# Patient Record
Sex: Female | Born: 1935 | Race: White | Hispanic: No | Smoking: Former smoker
Health system: Southern US, Community
[De-identification: ages and names within clinical notes are randomized; demographics above are authoritative.]

## PROBLEM LIST (undated history)

## (undated) DIAGNOSIS — G20 Parkinson's disease: Secondary | ICD-10-CM

## (undated) DIAGNOSIS — J189 Pneumonia, unspecified organism: Secondary | ICD-10-CM

## (undated) DIAGNOSIS — G20A1 Parkinson's disease without dyskinesia, without mention of fluctuations: Secondary | ICD-10-CM

## (undated) DIAGNOSIS — J9620 Acute and chronic respiratory failure, unspecified whether with hypoxia or hypercapnia: Secondary | ICD-10-CM

## (undated) DIAGNOSIS — M47819 Spondylosis without myelopathy or radiculopathy, site unspecified: Secondary | ICD-10-CM

## (undated) DIAGNOSIS — Z923 Personal history of irradiation: Secondary | ICD-10-CM

## (undated) DIAGNOSIS — I10 Essential (primary) hypertension: Secondary | ICD-10-CM

## (undated) DIAGNOSIS — R296 Repeated falls: Secondary | ICD-10-CM

## (undated) DIAGNOSIS — I639 Cerebral infarction, unspecified: Secondary | ICD-10-CM

## (undated) DIAGNOSIS — R1310 Dysphagia, unspecified: Secondary | ICD-10-CM

## (undated) DIAGNOSIS — I69354 Hemiplegia and hemiparesis following cerebral infarction affecting left non-dominant side: Secondary | ICD-10-CM

## (undated) DIAGNOSIS — C801 Malignant (primary) neoplasm, unspecified: Secondary | ICD-10-CM

## (undated) DIAGNOSIS — N289 Disorder of kidney and ureter, unspecified: Secondary | ICD-10-CM

## (undated) DIAGNOSIS — I4510 Unspecified right bundle-branch block: Secondary | ICD-10-CM

## (undated) DIAGNOSIS — J449 Chronic obstructive pulmonary disease, unspecified: Secondary | ICD-10-CM

## (undated) DIAGNOSIS — D649 Anemia, unspecified: Secondary | ICD-10-CM

## (undated) DIAGNOSIS — G8929 Other chronic pain: Secondary | ICD-10-CM

## (undated) DIAGNOSIS — K219 Gastro-esophageal reflux disease without esophagitis: Secondary | ICD-10-CM

## (undated) HISTORY — DX: Personal history of irradiation: Z92.3

## (undated) HISTORY — PX: ABDOMINAL HYSTERECTOMY: SHX81

---

## 1997-10-19 ENCOUNTER — Other Ambulatory Visit: Admission: RE | Admit: 1997-10-19 | Discharge: 1997-10-19 | Payer: Self-pay | Admitting: Family Medicine

## 1997-10-29 ENCOUNTER — Ambulatory Visit (HOSPITAL_COMMUNITY): Admission: RE | Admit: 1997-10-29 | Discharge: 1997-10-29 | Payer: Self-pay | Admitting: Family Medicine

## 1998-02-15 ENCOUNTER — Emergency Department (HOSPITAL_COMMUNITY): Admission: EM | Admit: 1998-02-15 | Discharge: 1998-02-15 | Payer: Self-pay | Admitting: Emergency Medicine

## 1998-05-18 ENCOUNTER — Other Ambulatory Visit: Admission: RE | Admit: 1998-05-18 | Discharge: 1998-05-18 | Payer: Self-pay | Admitting: *Deleted

## 1999-03-08 ENCOUNTER — Encounter (INDEPENDENT_AMBULATORY_CARE_PROVIDER_SITE_OTHER): Payer: Self-pay

## 1999-03-08 ENCOUNTER — Ambulatory Visit (HOSPITAL_COMMUNITY): Admission: RE | Admit: 1999-03-08 | Discharge: 1999-03-08 | Payer: Self-pay | Admitting: Gastroenterology

## 1999-07-20 ENCOUNTER — Other Ambulatory Visit: Admission: RE | Admit: 1999-07-20 | Discharge: 1999-07-20 | Payer: Self-pay | Admitting: Family Medicine

## 1999-10-05 ENCOUNTER — Encounter: Admission: RE | Admit: 1999-10-05 | Discharge: 1999-10-05 | Payer: Self-pay | Admitting: Gastroenterology

## 1999-10-05 ENCOUNTER — Encounter: Payer: Self-pay | Admitting: Gastroenterology

## 2000-07-01 ENCOUNTER — Other Ambulatory Visit: Admission: RE | Admit: 2000-07-01 | Discharge: 2000-07-01 | Payer: Self-pay | Admitting: Family Medicine

## 2000-12-25 ENCOUNTER — Encounter: Payer: Self-pay | Admitting: Emergency Medicine

## 2000-12-25 ENCOUNTER — Emergency Department (HOSPITAL_COMMUNITY): Admission: EM | Admit: 2000-12-25 | Discharge: 2000-12-25 | Payer: Self-pay

## 2001-04-04 ENCOUNTER — Inpatient Hospital Stay (HOSPITAL_COMMUNITY): Admission: EM | Admit: 2001-04-04 | Discharge: 2001-04-05 | Payer: Self-pay | Admitting: Emergency Medicine

## 2001-04-04 ENCOUNTER — Encounter: Payer: Self-pay | Admitting: Emergency Medicine

## 2005-07-18 ENCOUNTER — Encounter: Admission: RE | Admit: 2005-07-18 | Discharge: 2005-10-16 | Payer: Self-pay | Admitting: Family Medicine

## 2006-07-13 ENCOUNTER — Inpatient Hospital Stay (HOSPITAL_COMMUNITY): Admission: EM | Admit: 2006-07-13 | Discharge: 2006-07-17 | Payer: Self-pay | Admitting: Emergency Medicine

## 2006-07-15 ENCOUNTER — Encounter (INDEPENDENT_AMBULATORY_CARE_PROVIDER_SITE_OTHER): Payer: Self-pay | Admitting: Neurology

## 2006-07-15 ENCOUNTER — Encounter (INDEPENDENT_AMBULATORY_CARE_PROVIDER_SITE_OTHER): Payer: Self-pay | Admitting: Cardiology

## 2006-07-17 ENCOUNTER — Inpatient Hospital Stay (HOSPITAL_COMMUNITY)
Admission: RE | Admit: 2006-07-17 | Discharge: 2006-08-01 | Payer: Self-pay | Admitting: Physical Medicine & Rehabilitation

## 2006-07-18 ENCOUNTER — Ambulatory Visit: Payer: Self-pay | Admitting: Physical Medicine & Rehabilitation

## 2006-09-13 ENCOUNTER — Ambulatory Visit: Payer: Self-pay | Admitting: Physical Medicine & Rehabilitation

## 2006-09-13 ENCOUNTER — Encounter
Admission: RE | Admit: 2006-09-13 | Discharge: 2006-12-12 | Payer: Self-pay | Admitting: Physical Medicine & Rehabilitation

## 2006-09-18 ENCOUNTER — Encounter
Admission: RE | Admit: 2006-09-18 | Discharge: 2006-12-17 | Payer: Self-pay | Admitting: Physical Medicine & Rehabilitation

## 2006-11-19 ENCOUNTER — Ambulatory Visit: Payer: Self-pay | Admitting: Physical Medicine & Rehabilitation

## 2006-12-18 ENCOUNTER — Encounter
Admission: RE | Admit: 2006-12-18 | Discharge: 2007-03-18 | Payer: Self-pay | Admitting: Physical Medicine & Rehabilitation

## 2007-01-17 ENCOUNTER — Ambulatory Visit: Payer: Self-pay | Admitting: Physical Medicine & Rehabilitation

## 2007-01-17 ENCOUNTER — Encounter
Admission: RE | Admit: 2007-01-17 | Discharge: 2007-01-20 | Payer: Self-pay | Admitting: Physical Medicine & Rehabilitation

## 2008-08-10 ENCOUNTER — Encounter: Admission: RE | Admit: 2008-08-10 | Discharge: 2008-08-10 | Payer: Self-pay | Admitting: Family Medicine

## 2009-06-09 ENCOUNTER — Inpatient Hospital Stay (HOSPITAL_COMMUNITY): Admission: EM | Admit: 2009-06-09 | Discharge: 2009-06-16 | Payer: Self-pay | Admitting: Emergency Medicine

## 2009-06-10 ENCOUNTER — Ambulatory Visit: Payer: Self-pay | Admitting: Vascular Surgery

## 2009-06-10 ENCOUNTER — Encounter (INDEPENDENT_AMBULATORY_CARE_PROVIDER_SITE_OTHER): Payer: Self-pay | Admitting: Internal Medicine

## 2010-06-16 ENCOUNTER — Other Ambulatory Visit: Payer: Self-pay | Admitting: Gynecology

## 2010-06-16 DIAGNOSIS — N950 Postmenopausal bleeding: Secondary | ICD-10-CM

## 2010-06-19 ENCOUNTER — Ambulatory Visit
Admission: RE | Admit: 2010-06-19 | Discharge: 2010-06-19 | Disposition: A | Discharge status: Home / Self Care | Payer: Self-pay | Source: Ambulatory Visit | Attending: Gynecology | Admitting: Gynecology

## 2010-06-19 DIAGNOSIS — N950 Postmenopausal bleeding: Secondary | ICD-10-CM

## 2010-06-19 MED ORDER — IOHEXOL 300 MG/ML  SOLN
100.0000 mL | Freq: Once | INTRAMUSCULAR | Status: AC | PRN
  Administered 2010-06-19: 100 mL via INTRAVENOUS

## 2010-06-21 ENCOUNTER — Ambulatory Visit: Payer: Medicare Other | Attending: Gynecologic Oncology | Admitting: Gynecologic Oncology

## 2010-06-21 DIAGNOSIS — C549 Malignant neoplasm of corpus uteri, unspecified: Secondary | ICD-10-CM | POA: Insufficient documentation

## 2010-06-21 DIAGNOSIS — E119 Type 2 diabetes mellitus without complications: Secondary | ICD-10-CM | POA: Insufficient documentation

## 2010-06-21 DIAGNOSIS — I10 Essential (primary) hypertension: Secondary | ICD-10-CM | POA: Insufficient documentation

## 2010-06-21 DIAGNOSIS — Z794 Long term (current) use of insulin: Secondary | ICD-10-CM | POA: Insufficient documentation

## 2010-06-21 DIAGNOSIS — R351 Nocturia: Secondary | ICD-10-CM | POA: Insufficient documentation

## 2010-06-21 DIAGNOSIS — R350 Frequency of micturition: Secondary | ICD-10-CM | POA: Insufficient documentation

## 2010-06-21 DIAGNOSIS — Z79899 Other long term (current) drug therapy: Secondary | ICD-10-CM | POA: Insufficient documentation

## 2010-06-21 DIAGNOSIS — Z8673 Personal history of transient ischemic attack (TIA), and cerebral infarction without residual deficits: Secondary | ICD-10-CM | POA: Insufficient documentation

## 2010-06-21 DIAGNOSIS — Z86718 Personal history of other venous thrombosis and embolism: Secondary | ICD-10-CM | POA: Insufficient documentation

## 2010-06-21 DIAGNOSIS — K5900 Constipation, unspecified: Secondary | ICD-10-CM | POA: Insufficient documentation

## 2010-06-30 ENCOUNTER — Other Ambulatory Visit: Payer: Self-pay | Admitting: Gynecology

## 2010-06-30 ENCOUNTER — Ambulatory Visit (HOSPITAL_COMMUNITY)
Admission: RE | Admit: 2010-06-30 | Discharge: 2010-06-30 | Disposition: A | Discharge status: Home / Self Care | Payer: Medicare Other | Source: Ambulatory Visit | Attending: Gynecology | Admitting: Gynecology

## 2010-06-30 ENCOUNTER — Encounter (HOSPITAL_COMMUNITY): Payer: Medicare Other

## 2010-06-30 DIAGNOSIS — C55 Malignant neoplasm of uterus, part unspecified: Secondary | ICD-10-CM | POA: Insufficient documentation

## 2010-06-30 DIAGNOSIS — Z01818 Encounter for other preprocedural examination: Secondary | ICD-10-CM | POA: Insufficient documentation

## 2010-06-30 DIAGNOSIS — I10 Essential (primary) hypertension: Secondary | ICD-10-CM | POA: Insufficient documentation

## 2010-06-30 LAB — COMPREHENSIVE METABOLIC PANEL
ALT: <8 U/L (ref 0–35)
AST: 21 U/L (ref 0–37)
Albumin: 3.9 g/dL (ref 3.5–5.2)
CO2: 28 mEq/L (ref 19–32)
Calcium: 9.9 mg/dL (ref 8.4–10.5)
Chloride: 97 mEq/L (ref 96–112)
Creatinine, Ser: 0.92 mg/dL (ref 0.4–1.2)
GFR calc non Af Amer: 59 mL/min — ABNORMAL LOW (ref >60)
Sodium: 137 mEq/L (ref 135–145)

## 2010-06-30 LAB — TYPE AND SCREEN: Antibody Screen: NEGATIVE

## 2010-06-30 LAB — CBC
HCT: 35 % — ABNORMAL LOW (ref 36.0–46.0)
MCHC: 32.3 g/dL (ref 30.0–36.0)
Platelets: 274 10*3/uL (ref 150–400)
RDW: 13.7 % (ref 11.5–15.5)

## 2010-06-30 LAB — DIFFERENTIAL
Lymphocytes Relative: 35 % (ref 12–46)
Lymphs Abs: 2.1 10*3/uL (ref 0.7–4.0)
Monocytes Relative: 9 % (ref 3–12)
Neutro Abs: 3 10*3/uL (ref 1.7–7.7)
Neutrophils Relative %: 49 % (ref 43–77)

## 2010-06-30 LAB — SURGICAL PCR SCREEN: Staphylococcus aureus: INVALID — AB

## 2010-07-01 LAB — CA 125: CA 125: 14.3 U/mL (ref 0.0–30.2)

## 2010-07-03 LAB — MRSA CULTURE

## 2010-07-04 ENCOUNTER — Other Ambulatory Visit: Payer: Self-pay | Admitting: Gynecology

## 2010-07-04 ENCOUNTER — Inpatient Hospital Stay (HOSPITAL_COMMUNITY)
Admission: RE | Admit: 2010-07-04 | Discharge: 2010-07-07 | DRG: 741 | Disposition: A | Discharge status: Home Health | Payer: Medicare Other | Source: Ambulatory Visit | Attending: Gynecology | Admitting: Gynecology

## 2010-07-04 DIAGNOSIS — Z8673 Personal history of transient ischemic attack (TIA), and cerebral infarction without residual deficits: Secondary | ICD-10-CM

## 2010-07-04 DIAGNOSIS — C801 Malignant (primary) neoplasm, unspecified: Secondary | ICD-10-CM

## 2010-07-04 DIAGNOSIS — C549 Malignant neoplasm of corpus uteri, unspecified: Principal | ICD-10-CM | POA: Diagnosis present

## 2010-07-04 DIAGNOSIS — Z794 Long term (current) use of insulin: Secondary | ICD-10-CM

## 2010-07-04 DIAGNOSIS — E109 Type 1 diabetes mellitus without complications: Secondary | ICD-10-CM | POA: Diagnosis present

## 2010-07-04 DIAGNOSIS — C55 Malignant neoplasm of uterus, part unspecified: Secondary | ICD-10-CM | POA: Diagnosis present

## 2010-07-04 DIAGNOSIS — Z01818 Encounter for other preprocedural examination: Secondary | ICD-10-CM

## 2010-07-04 DIAGNOSIS — I10 Essential (primary) hypertension: Secondary | ICD-10-CM | POA: Diagnosis present

## 2010-07-04 DIAGNOSIS — Z950 Presence of cardiac pacemaker: Secondary | ICD-10-CM

## 2010-07-04 HISTORY — DX: Malignant (primary) neoplasm, unspecified: C80.1

## 2010-07-05 LAB — GLUCOSE, CAPILLARY
Glucose-Capillary: 197 mg/dL — ABNORMAL HIGH (ref 70–99)
Glucose-Capillary: 64 mg/dL — ABNORMAL LOW (ref 70–99)

## 2010-07-05 LAB — CBC
HCT: 35.7 % — ABNORMAL LOW (ref 36.0–46.0)
Hemoglobin: 11.4 g/dL — ABNORMAL LOW (ref 12.0–15.0)
MCH: 30.6 pg (ref 26.0–34.0)
MCHC: 31.9 g/dL (ref 30.0–36.0)
MCV: 95.7 fL (ref 78.0–100.0)

## 2010-07-05 LAB — BASIC METABOLIC PANEL
BUN: 11 mg/dL (ref 6–23)
Calcium: 9.1 mg/dL (ref 8.4–10.5)
GFR calc non Af Amer: 57 mL/min — ABNORMAL LOW (ref >60)
Glucose, Bld: 199 mg/dL — ABNORMAL HIGH (ref 70–99)

## 2010-07-05 NOTE — Op Note (Signed)
[name], VIVEROS NO.:  [id number]  MEDICAL RECORD NO.:  [id number]           PATIENT TYPE:  I  LOCATION:  [id number]                         FACILITY:  [facility]  PHYSICIAN:  Marti Sleigh, M.D.DATE OF BIRTH:  10/02/1928  DATE OF PROCEDURE: DATE OF DISCHARGE:                              OPERATIVE REPORT   PREOPERATIVE DIAGNOSIS:  High-grade endometrial carcinoma.  POSTOPERATIVE DIAGNOSIS:  High-grade endometrial carcinoma.  PROCEDURE:  Total abdominal hysterectomy, bilateral salpingo- oophorectomy.  SURGEON:  Marti Sleigh, MD  ASSISTANT:  Selinda Orion, MD  ANESTHESIA:  General with orotracheal tube.  ESTIMATED BLOOD LOSS:  75 mL.  SURGICAL FINDINGS:  At the time of exploratory laparotomy, the uterus was upper limits of normal size.  Tubes and ovaries appeared normal. There was no evidence of intraperitoneal metastatic disease after exploring the abdomen and pelvis.  Lymph nodes in the pelvis were not enlarged.  It was elected not to perform a pelvic lymphadenectomy given the patient's other medical comorbidities.  DESCRIPTION OF PROCEDURE:  The patient brought to the operating room and after satisfactory attainment of general anesthesia, was placed in modified lithotomy position in [place].  The anterior abdominal wall, perineum, and vagina were prepped.  A Foley catheter was inserted and the patient was draped.  The abdomen was entered through a Pfannenstiel incision.  Peritoneal washings were obtained from the pelvis.  The abdomen and pelvis were explored with the above-noted findings.  A Bookwalter retractor was assembled and the small bowel was packed out of pelvis.  The uterus was grasped with 2 long Kelly clamps. The round ligament on the right was divided, the retroperitoneal space opened, and the ureter identified.  The ovarian vessels were skeletonized, clamped, cut, free tied, and suture ligated.  Similar procedure  was performed on the left.  Bladder flap was advanced with sharp dissection.  The uterine vessels were skeletonized and clamped, cut, and suture ligated in a stepwise fashion.  The paracervical tissue and cardinal ligaments were clamped, cut, and suture ligated.  The vaginal angles were encountered.  These were crossclamped and the vagina transected from its connection to the cervix.  The uterus, cervix, tubes, and ovaries were handed off the operative field.  The vaginal angles were transfixed with 0 Vicryl.  The central portion of vagina closed with interrupted figure-of-8 sutures of 0 Vicryl.  Pelvis was irrigated and found to be hemostatic.  Packs and retractors were removed.  The anterior abdominal wall closed in layers.  The first being a running suture of 2-0 Vicryl on the parietal peritoneum.  The subfascial regions and rectus muscle were inspected and found to be hemostatic.  Fascia was reapproximated was a running suture of 0 PDS. Subcutaneous tissue was irrigated.  Hemostasis achieved with cautery.  Skin was closed with skin staples.  A dressing was applied.  The patient was awakened from anesthesia and taken to the recovery room in satisfactory condition. Sponge, needle, and instrument counts correct x2.     Marti Sleigh, M.D.     DC/MEDQ  D:  07/04/2010  T:  07/05/2010  Job:  [id number]  cc:  Selinda Orion, M.D. Fax: [phone]  Caswell Corwin, R.N. [address], [place] [address]  Electronically Signed by Marti Sleigh M.D. on 07/05/2010 11:19:21 AM

## 2010-07-06 LAB — GLUCOSE, CAPILLARY
Glucose-Capillary: 149 mg/dL — ABNORMAL HIGH (ref 70–99)
Glucose-Capillary: 165 mg/dL — ABNORMAL HIGH (ref 70–99)
Glucose-Capillary: 85 mg/dL (ref 70–99)
Glucose-Capillary: 89 mg/dL (ref 70–99)

## 2010-07-06 LAB — HEMOGLOBIN AND HEMATOCRIT, BLOOD: Hemoglobin: 9.6 g/dL — ABNORMAL LOW (ref 12.0–15.0)

## 2010-07-18 ENCOUNTER — Ambulatory Visit: Payer: Medicare Other | Attending: Gynecology | Admitting: Gynecology

## 2010-07-18 DIAGNOSIS — Z9079 Acquired absence of other genital organ(s): Secondary | ICD-10-CM | POA: Insufficient documentation

## 2010-07-18 DIAGNOSIS — Z90710 Acquired absence of both cervix and uterus: Secondary | ICD-10-CM | POA: Insufficient documentation

## 2010-07-18 DIAGNOSIS — C549 Malignant neoplasm of corpus uteri, unspecified: Secondary | ICD-10-CM | POA: Insufficient documentation

## 2010-07-31 LAB — CBC
HCT: 34.5 % — ABNORMAL LOW (ref 36.0–46.0)
HCT: 38.1 % (ref 36.0–46.0)
Hemoglobin: 11.9 g/dL — ABNORMAL LOW (ref 12.0–15.0)
Hemoglobin: 12.8 g/dL (ref 12.0–15.0)
MCHC: 33.6 g/dL (ref 30.0–36.0)
MCV: 95.8 fL (ref 78.0–100.0)
MCV: 96.3 fL (ref 78.0–100.0)
MCV: 96.6 fL (ref 78.0–100.0)
Platelets: 245 10*3/uL (ref 150–400)
RBC: 3.1 MIL/uL — ABNORMAL LOW (ref 3.87–5.11)
RDW: 12.5 % (ref 11.5–15.5)
RDW: 13.3 % (ref 11.5–15.5)
WBC: 11.9 10*3/uL — ABNORMAL HIGH (ref 4.0–10.5)

## 2010-07-31 LAB — PROTIME-INR: INR: 1.04 (ref 0.00–1.49)

## 2010-07-31 LAB — DIFFERENTIAL
Basophils Absolute: 0 10*3/uL (ref 0.0–0.1)
Basophils Relative: 0 % (ref 0–1)
Eosinophils Relative: 4 % (ref 0–5)
Monocytes Absolute: 0.6 10*3/uL (ref 0.1–1.0)
Neutro Abs: 6.5 10*3/uL (ref 1.7–7.7)

## 2010-07-31 LAB — TROPONIN I: Troponin I: 0.01 ng/mL (ref 0.00–0.06)

## 2010-07-31 LAB — URINALYSIS, ROUTINE W REFLEX MICROSCOPIC
Bilirubin Urine: NEGATIVE
Glucose, UA: NEGATIVE mg/dL
Hgb urine dipstick: NEGATIVE
Ketones, ur: NEGATIVE mg/dL
Protein, ur: NEGATIVE mg/dL
pH: 6.5 (ref 5.0–8.0)

## 2010-07-31 LAB — CARDIAC PANEL(CRET KIN+CKTOT+MB+TROPI)
CK, MB: 1 ng/mL (ref 0.3–4.0)
CK, MB: 2.3 ng/mL (ref 0.3–4.0)
Relative Index: INVALID (ref 0.0–2.5)
Relative Index: INVALID (ref 0.0–2.5)
Total CK: 40 U/L (ref 7–177)
Total CK: 44 U/L (ref 7–177)
Troponin I: 0.01 ng/mL (ref 0.00–0.06)
Troponin I: 0.02 ng/mL (ref 0.00–0.06)
Troponin I: 0.05 ng/mL (ref 0.00–0.06)

## 2010-07-31 LAB — HOMOCYSTEINE: Homocysteine: 8.2 umol/L (ref 4.0–15.4)

## 2010-07-31 LAB — GLUCOSE, CAPILLARY
Glucose-Capillary: 112 mg/dL — ABNORMAL HIGH (ref 70–99)
Glucose-Capillary: 119 mg/dL — ABNORMAL HIGH (ref 70–99)
Glucose-Capillary: 122 mg/dL — ABNORMAL HIGH (ref 70–99)
Glucose-Capillary: 143 mg/dL — ABNORMAL HIGH (ref 70–99)
Glucose-Capillary: 149 mg/dL — ABNORMAL HIGH (ref 70–99)
Glucose-Capillary: 85 mg/dL (ref 70–99)

## 2010-07-31 LAB — RENAL FUNCTION PANEL
BUN: 17 mg/dL (ref 6–23)
CO2: 24 mEq/L (ref 19–32)
Chloride: 113 mEq/L — ABNORMAL HIGH (ref 96–112)
Glucose, Bld: 100 mg/dL — ABNORMAL HIGH (ref 70–99)
Potassium: 3.9 mEq/L (ref 3.5–5.1)

## 2010-07-31 LAB — COMPREHENSIVE METABOLIC PANEL
AST: 18 U/L (ref 0–37)
AST: 33 U/L (ref 0–37)
Albumin: 3.5 g/dL (ref 3.5–5.2)
Alkaline Phosphatase: 34 U/L — ABNORMAL LOW (ref 39–117)
Alkaline Phosphatase: 37 U/L — ABNORMAL LOW (ref 39–117)
BUN: 17 mg/dL (ref 6–23)
BUN: 23 mg/dL (ref 6–23)
CO2: 30 mEq/L (ref 19–32)
CO2: 31 mEq/L (ref 19–32)
Calcium: 9.6 mg/dL (ref 8.4–10.5)
Chloride: 98 mEq/L (ref 96–112)
Chloride: 99 mEq/L (ref 96–112)
Creatinine, Ser: 0.98 mg/dL (ref 0.4–1.2)
GFR calc Af Amer: >60 mL/min (ref >60)
GFR calc non Af Amer: 47 mL/min — ABNORMAL LOW (ref >60)
GFR calc non Af Amer: 55 mL/min — ABNORMAL LOW (ref >60)
Glucose, Bld: 210 mg/dL — ABNORMAL HIGH (ref 70–99)
Potassium: 3.8 mEq/L (ref 3.5–5.1)
Potassium: 4.1 mEq/L (ref 3.5–5.1)
Total Bilirubin: 0.6 mg/dL (ref 0.3–1.2)
Total Bilirubin: 1.1 mg/dL (ref 0.3–1.2)

## 2010-07-31 LAB — HEMOGLOBIN A1C
Hgb A1c MFr Bld: 7.3 % — ABNORMAL HIGH (ref 4.6–6.1)
Mean Plasma Glucose: 163 mg/dL

## 2010-07-31 LAB — BASIC METABOLIC PANEL
BUN: 17 mg/dL (ref 6–23)
GFR calc Af Amer: >60 mL/min (ref >60)
GFR calc non Af Amer: 57 mL/min — ABNORMAL LOW (ref >60)
Potassium: 4 mEq/L (ref 3.5–5.1)

## 2010-07-31 LAB — LIPID PANEL: Cholesterol: 146 mg/dL (ref 0–200)

## 2010-07-31 LAB — MRSA PCR SCREENING: MRSA by PCR: NEGATIVE

## 2010-07-31 LAB — CK TOTAL AND CKMB (NOT AT ARMC): CK, MB: 0.9 ng/mL (ref 0.3–4.0)

## 2010-07-31 LAB — APTT: aPTT: 35 seconds (ref 24–37)

## 2010-08-04 LAB — GLUCOSE, CAPILLARY
Glucose-Capillary: 103 mg/dL — ABNORMAL HIGH (ref 70–99)
Glucose-Capillary: 109 mg/dL — ABNORMAL HIGH (ref 70–99)
Glucose-Capillary: 194 mg/dL — ABNORMAL HIGH (ref 70–99)
Glucose-Capillary: 241 mg/dL — ABNORMAL HIGH (ref 70–99)

## 2010-08-04 NOTE — Consult Note (Signed)
[name], [name] NO.:  [id number]  MEDICAL RECORD NO.:  [id number]          PATIENT TYPE:  LOCATION:                                 FACILITY:  PHYSICIAN:  Arjun Hard A. Alycia Rossetti, MD    DATE OF BIRTH:  08/13/1928  DATE OF CONSULTATION:  06/21/2010 DATE OF DISCHARGE:                                CONSULTATION   REASON FOR CONSULTATION:  The patient is seen today in consultation at the request of Dr. Ubaldo Glassing.  HISTORY OF PRESENT ILLNESS:  [name] is a very pleasant 77 year old gravida 2, para 2 who in August 2011 began having some bleeding.  She was told that it was internal hemorrhoids and she underwent a colonoscopy which revealed two benign polyps.  She continued to have vaginal bleeding and subsequently saw Dr. Ubaldo Glassing and underwent an endometrial biopsy on June 15, 2010.  It revealed a high-grade malignant neoplasm with a solid sheet of pleomorphic cells with brisk mitoses area of necrosis.  This appeared epithelial with some small focal glandular changes.  In reviewing the pathology, it could be consistent with high-grade endometrioid adenocarcinoma, though the read does give this one the suspicion that it could be a carcinosarcoma.  She underwent a CT scan of the abdomen and pelvis that revealed an irregular mass in the uterus measuring 6.1 x 5.7 cm.  There was some atherosclerosis.  There is no pathologic enlarged lymph nodes.  There is no lytic or sclerotic nodes.  There is no evidence of metastatic disease.  She is overall doing quite well and comes accompanied by her husband.  REVIEW OF SYSTEMS:  She does complain of some occasional constipation. It was worse when she was prescribed with iron.  Her last hemoglobin, per her report, was 11.  She does complain of some urinary frequency and some nocturia, but she states that this is due to her diabetes and to the fact that she drinks a lot of water because she is thirsty.  She does wear a diaper at  night.  She denies any chest pain, shortness of breath, nausea, vomiting, fevers, chills, unintentional weight loss or weight gain.  The last time she was hospitalized was in January 2011. The patient ambulates with a walker and acts fairly independently at home.  She does her laundry, does her activities of daily living.  She does state that her husband does help her with her shower to help wash her hair.  She cannot elevate her left arm over the level of her shoulder.  She currently is enjoying a very good quality of life, living independently with her husband.  ALLERGIES:  PENICILLIN which causes a rash.  MEDICATIONS: 1. Lovastatin 20 mg daily. 2. Bisoprolol/HCTZ daily. 3. Clonidine 3 mg two tablets p.o. b.i.d. 4. Metformin 500 mg b.i.d. 5. Carbidopa/levodopa two tablets daily. 6. Lisinopril 30 mg daily. 7. Lantus 45 units daily. 8. Xanax 0.25 mg at q.h.s. p.r.n. 9. Prozac 20 mg daily.  PAST SURGICAL HISTORY:  Appendectomy at the age of 26.  She had a benign breast cyst removed off her right breast.  She has had two spontaneous  vaginal deliveries.  SOCIAL HISTORY:  She denies the use of tobacco.  She quit greater than 25 years ago.  She uses no alcohol.  As stated above, she lives independently at home with her husband.  FAMILY HISTORY:  Her mother had hypertension and diabetes.  Her mother died in her 60s.  Her father had a gastric carcinoma and died of a myocardial infarction.  She had a brother with lung cancer and a brother with a stroke.  She has two children, a son who is age 70 and a daughter who is 64.  She has one grandchild who is 60 who is alive and well.  PAST MEDICAL HISTORY:  Significant for hypertension, diabetes, history of Parkinson's disease, history of a cerebrovascular accident with a right pontine infarct in 2008 and 2002, hyperlipidemia, depression, history of deep venous thrombosis in the distant past and peripheral neuropathy secondary to  diabetes.  HEALTH MAINTENANCE:  Her last mammogram was more than 10 years ago.  PHYSICAL EXAMINATION:  VITAL SIGNS:  Weight 118 pounds, blood pressure 124/86, pulse 66, respiration is 20, temperature 98. GENERAL:  A well-nourished, well-developed female in no acute distress. The patient is very communicative and with a good affect.  NECK: Supple.  There is no lymphadenopathy.  No thyromegaly. LUNGS:  Clear to auscultation bilaterally. CARDIOVASCULAR:  Regular rate and rhythm. ABDOMEN:  Somewhat protuberant due to kyphoscoliosis.  Abdomen is soft, nontender, nondistended.  There are no palpable masses or hepatosplenomegaly. GROINS:  Negative for adenopathy. EXTREMITIES:  She wears a brace on her left leg.  She has no edema. PELVIC:  External genitalia is within normal limits, though atrophic. The vagina is atrophic.  The cervix is visualized.  There is menstrual bloody flow, but there is no gross visible lesions.  Bimanual examination is limited by patient's tolerance of exam.  Cervix is palpably normal.  There is no significant adenopathy.  The uterus is slightly globular at 8 weeks' size.  There is no nodularity.  Rectal confirms.  ASSESSMENT AND PLAN:  A 77 year old with multiple medical problemsincluding insulin-requiring diabetes but it is type 2, Parkinson disease and history of strokes with what appears to be high-grade endometrial carcinoma.  It is a clinical stage I.  Recent imaging is negative.  The patient's primary physician, Dr. Rex Kras, at this point has already taken her off of her Aggrenox in anticipation of surgery as well as due to her bleeding.  We will need to contact him to ensure that he has no concerns regarding surgery.  I believe that the patient is an adequate surgical candidate to undergo a simple hysterectomy, bilateral salpingo- oophorectomy via laparotomy.  If she is doing well, one could consider some lymph node sampling but that would not necessarily be  indicated. The family knows this.  We will see how she does postoperatively and then discuss the possibility of adjuvant therapy.  Their questions were elicited and answered to their satisfaction.  They are very pleased that we would consider doing her surgery here in [place] versus [place] due to significant difficulties getting to [facility].  This is much more convenient for them.  We did discuss thromboembolic disease and precautions either being Lovenox or heparin in the preoperative and perioperative timing as well as sequential compression device boots. Other risks and benefits including, but not limited to injury to the surrounding organs, blood transfusion and infection were discussed with the patient.  They wished to proceed.  She understands that Dr. Fermin Schwab will  be the surgeon.   She will be seen in the preoperative center.  We will contact Dr. Rex Kras regarding any other perioperative concerns.     Mendy Lapinsky A. Alycia Rossetti, MD     PAG/MEDQ  D:  06/21/2010  T:  06/22/2010  Job:  [id number]  cc:   Caswell Corwin, R.N. [address], [place] [address]  Tamsen Roers Fax: [phone]  Selinda Orion, M.D. [phone]  Electronically Signed by Nancy Marus MD on 06/23/2010 10:18:59 AM

## 2010-08-04 NOTE — Consult Note (Signed)
  [name], [name] NO.:  [id number]  MEDICAL RECORD NO.:  [id number]           PATIENT TYPE:  LOCATION:                                 FACILITY:  PHYSICIAN:  Marti Sleigh, M.D.DATE OF BIRTH:  06-12-1928  DATE OF CONSULTATION:  07/18/2010 DATE OF DISCHARGE:                                CONSULTATION   CHIEF COMPLAINT:  Endometrial cancer.  INTERVAL HISTORY:  The patient underwent a total abdominal hysterectomy with bilateral salpingo-oophorectomy on July 04, 2010.  Final pathology showed a high-grade endometrioid adenocarcinoma, which was quite large (6 cm).  Tumor invaded greater than 50% of the myometrium, although there was no lymphovascular involvement or any involvement of the adnexa.  Peritoneal washings were negative.  The patient has had an uncomplicated postoperative recovery.  She denies any GI or GU symptoms; has no pelvic pain, pressure, vaginal bleeding or discharge.  Her functional status is nearly back to 100%, she claims.  She is in good spirits today.  PHYSICAL EXAMINATION:  VITAL SIGNS:  Weight 117 pounds, blood pressure 118/72. GENERAL:  The patient is a healthy elderly white female with a tremor. HEENT:  Negative. NECK:  Supple without thyromegaly.  There is no supraclavicular or inguinal adenopathy. ABDOMEN:  Soft and nontender.  Transverse incision is healing well. PELVIC:  Exam is deferred.  IMPRESSION AND RECOMMENDATIONS:  High-grade endometrial cancer with deep myometrial invasion.  Given the patient's other medical comorbidities, we did not complete surgical staging with the pelvic and periaortic lymphadenectomy.  Given the fact that she is at high risk for pelvic lymph node metastases, I would recommend that she receive postoperative whole pelvis radiation therapy.  This was discussed with the patient and her husband.  We will arrange for her to be seen by Dr. Gery Pray. She will return to see me for a  6-week postoperative checkup.     Marti Sleigh, M.D.     DC/MEDQ  D:  07/18/2010  T:  07/18/2010  Job:  [id number]  cc:   Caswell Corwin, R.N. [address] [place], [place] [address]  Selinda Orion, M.D. Fax: [phone]  Blair Promise, Ph.D., M.D. [phone]  Electronically Signed by Marti Sleigh M.D. on 07/28/2010 09:18:59 AM

## 2010-08-29 ENCOUNTER — Inpatient Hospital Stay (HOSPITAL_COMMUNITY)
Admission: EM | Admit: 2010-08-29 | Discharge: 2010-09-05 | DRG: 305 | Disposition: A | Discharge status: Skilled Nursing Facility | Payer: Medicare Other | Attending: Internal Medicine | Admitting: Internal Medicine

## 2010-08-29 ENCOUNTER — Emergency Department (HOSPITAL_COMMUNITY): Payer: Medicare Other

## 2010-08-29 DIAGNOSIS — I498 Other specified cardiac arrhythmias: Secondary | ICD-10-CM | POA: Diagnosis present

## 2010-08-29 DIAGNOSIS — I10 Essential (primary) hypertension: Principal | ICD-10-CM | POA: Diagnosis present

## 2010-08-29 DIAGNOSIS — C549 Malignant neoplasm of corpus uteri, unspecified: Secondary | ICD-10-CM | POA: Diagnosis present

## 2010-08-29 DIAGNOSIS — Z79899 Other long term (current) drug therapy: Secondary | ICD-10-CM

## 2010-08-29 DIAGNOSIS — Z794 Long term (current) use of insulin: Secondary | ICD-10-CM

## 2010-08-29 DIAGNOSIS — G20A1 Parkinson's disease without dyskinesia, without mention of fluctuations: Secondary | ICD-10-CM | POA: Diagnosis present

## 2010-08-29 DIAGNOSIS — G20 Parkinson's disease: Secondary | ICD-10-CM | POA: Diagnosis present

## 2010-08-29 DIAGNOSIS — Y92009 Unspecified place in unspecified non-institutional (private) residence as the place of occurrence of the external cause: Secondary | ICD-10-CM

## 2010-08-29 DIAGNOSIS — I6529 Occlusion and stenosis of unspecified carotid artery: Secondary | ICD-10-CM | POA: Diagnosis present

## 2010-08-29 DIAGNOSIS — Z880 Allergy status to penicillin: Secondary | ICD-10-CM

## 2010-08-29 DIAGNOSIS — R627 Adult failure to thrive: Secondary | ICD-10-CM | POA: Diagnosis present

## 2010-08-29 DIAGNOSIS — F3289 Other specified depressive episodes: Secondary | ICD-10-CM | POA: Diagnosis present

## 2010-08-29 DIAGNOSIS — T465X5A Adverse effect of other antihypertensive drugs, initial encounter: Secondary | ICD-10-CM | POA: Diagnosis present

## 2010-08-29 DIAGNOSIS — I658 Occlusion and stenosis of other precerebral arteries: Secondary | ICD-10-CM | POA: Diagnosis present

## 2010-08-29 DIAGNOSIS — E119 Type 2 diabetes mellitus without complications: Secondary | ICD-10-CM | POA: Diagnosis present

## 2010-08-29 DIAGNOSIS — W1809XA Striking against other object with subsequent fall, initial encounter: Secondary | ICD-10-CM | POA: Diagnosis present

## 2010-08-29 DIAGNOSIS — J320 Chronic maxillary sinusitis: Secondary | ICD-10-CM | POA: Diagnosis present

## 2010-08-29 DIAGNOSIS — Z8673 Personal history of transient ischemic attack (TIA), and cerebral infarction without residual deficits: Secondary | ICD-10-CM

## 2010-08-29 DIAGNOSIS — F329 Major depressive disorder, single episode, unspecified: Secondary | ICD-10-CM | POA: Diagnosis present

## 2010-08-29 DIAGNOSIS — Z9181 History of falling: Secondary | ICD-10-CM

## 2010-08-29 LAB — URINALYSIS, ROUTINE W REFLEX MICROSCOPIC
Bilirubin Urine: NEGATIVE
Hgb urine dipstick: NEGATIVE
Ketones, ur: NEGATIVE mg/dL
Nitrite: NEGATIVE
Protein, ur: NEGATIVE mg/dL
Specific Gravity, Urine: 1.018 (ref 1.005–1.030)
Urobilinogen, UA: 0.2 mg/dL (ref 0.0–1.0)

## 2010-08-29 LAB — BASIC METABOLIC PANEL
Calcium: 10 mg/dL (ref 8.4–10.5)
Creatinine, Ser: 0.91 mg/dL (ref 0.4–1.2)
GFR calc non Af Amer: 60 mL/min — ABNORMAL LOW (ref >60)
Glucose, Bld: 90 mg/dL (ref 70–99)
Sodium: 139 mEq/L (ref 135–145)

## 2010-08-29 LAB — DIFFERENTIAL
Basophils Absolute: 0 10*3/uL (ref 0.0–0.1)
Basophils Relative: 0 % (ref 0–1)
Eosinophils Relative: 1 % (ref 0–5)
Lymphocytes Relative: 27 % (ref 12–46)
Monocytes Absolute: 0.9 10*3/uL (ref 0.1–1.0)
Monocytes Relative: 9 % (ref 3–12)

## 2010-08-29 LAB — CBC
HCT: 35.5 % — ABNORMAL LOW (ref 36.0–46.0)
MCH: 30.3 pg (ref 26.0–34.0)
MCHC: 32.7 g/dL (ref 30.0–36.0)
RDW: 13.4 % (ref 11.5–15.5)

## 2010-08-29 LAB — GLUCOSE, CAPILLARY: Glucose-Capillary: 102 mg/dL — ABNORMAL HIGH (ref 70–99)

## 2010-08-29 LAB — POCT CARDIAC MARKERS
CKMB, poc: <1 ng/mL — ABNORMAL LOW (ref 1.0–8.0)
Troponin i, poc: <0.05 ng/mL (ref 0.00–0.09)

## 2010-08-30 ENCOUNTER — Inpatient Hospital Stay (HOSPITAL_COMMUNITY): Payer: Medicare Other

## 2010-08-30 LAB — URINE CULTURE
Colony Count: NO GROWTH
Culture: NO GROWTH

## 2010-08-30 LAB — GLUCOSE, CAPILLARY
Glucose-Capillary: 82 mg/dL (ref 70–99)
Glucose-Capillary: 149 mg/dL — ABNORMAL HIGH (ref 70–99)

## 2010-08-30 MED ORDER — GADOBENATE DIMEGLUMINE 529 MG/ML IV SOLN
10.0000 mL | Freq: Once | INTRAVENOUS | Status: AC | PRN
  Administered 2010-08-30: 10 mL via INTRAVENOUS

## 2010-08-31 LAB — GLUCOSE, CAPILLARY
Glucose-Capillary: 114 mg/dL — ABNORMAL HIGH (ref 70–99)
Glucose-Capillary: 143 mg/dL — ABNORMAL HIGH (ref 70–99)
Glucose-Capillary: 218 mg/dL — ABNORMAL HIGH (ref 70–99)
Glucose-Capillary: 160 mg/dL — ABNORMAL HIGH (ref 70–99)

## 2010-08-31 LAB — BASIC METABOLIC PANEL
CO2: 25 mEq/L (ref 19–32)
Chloride: 107 mEq/L (ref 96–112)
Creatinine, Ser: 0.85 mg/dL (ref 0.4–1.2)
GFR calc Af Amer: >60 mL/min (ref >60)
Potassium: 4.1 mEq/L (ref 3.5–5.1)

## 2010-09-01 LAB — GLUCOSE, CAPILLARY
Glucose-Capillary: 131 mg/dL — ABNORMAL HIGH (ref 70–99)
Glucose-Capillary: 143 mg/dL — ABNORMAL HIGH (ref 70–99)

## 2010-09-01 LAB — CORTISOL-AM, BLOOD: Cortisol - AM: 5.4 ug/dL (ref 4.3–22.4)

## 2010-09-02 LAB — GLUCOSE, CAPILLARY: Glucose-Capillary: 98 mg/dL (ref 70–99)

## 2010-09-02 LAB — VITAMIN D 1,25 DIHYDROXY: Vitamin D3 1, 25 (OH)2: 24 pg/mL

## 2010-09-03 LAB — BASIC METABOLIC PANEL
Calcium: 10 mg/dL (ref 8.4–10.5)
GFR calc Af Amer: >60 mL/min (ref >60)
GFR calc non Af Amer: >60 mL/min (ref >60)
Glucose, Bld: 124 mg/dL — ABNORMAL HIGH (ref 70–99)
Sodium: 140 mEq/L (ref 135–145)

## 2010-09-03 LAB — GLUCOSE, CAPILLARY: Glucose-Capillary: 120 mg/dL — ABNORMAL HIGH (ref 70–99)

## 2010-09-04 ENCOUNTER — Ambulatory Visit: Payer: Medicare Other | Admitting: Radiation Oncology

## 2010-09-04 LAB — GLUCOSE, CAPILLARY
Glucose-Capillary: 137 mg/dL — ABNORMAL HIGH (ref 70–99)
Glucose-Capillary: 154 mg/dL — ABNORMAL HIGH (ref 70–99)
Glucose-Capillary: 111 mg/dL — ABNORMAL HIGH (ref 70–99)

## 2010-09-11 ENCOUNTER — Ambulatory Visit: Payer: Medicare Other | Attending: Radiation Oncology | Admitting: Radiation Oncology

## 2010-09-11 DIAGNOSIS — R197 Diarrhea, unspecified: Secondary | ICD-10-CM | POA: Insufficient documentation

## 2010-09-11 DIAGNOSIS — R5381 Other malaise: Secondary | ICD-10-CM | POA: Insufficient documentation

## 2010-09-11 DIAGNOSIS — C549 Malignant neoplasm of corpus uteri, unspecified: Secondary | ICD-10-CM | POA: Insufficient documentation

## 2010-09-11 DIAGNOSIS — Z510 Encounter for antineoplastic radiation therapy: Secondary | ICD-10-CM | POA: Insufficient documentation

## 2010-09-11 DIAGNOSIS — R634 Abnormal weight loss: Secondary | ICD-10-CM | POA: Insufficient documentation

## 2010-09-14 NOTE — H&P (Signed)
[name], [name] NO.:  [id number]  MEDICAL RECORD NO.:  [id number]           PATIENT TYPE:  E  LOCATION:  WLED                         [facility]ACILITY:  [facility]  PHYSICIAN:  Ardyth Gal, MD    DATE OF BIRTH:  06-25-1928  DATE OF ADMISSION:  08/29/2010 DATE OF DISCHARGE:                             HISTORY & PHYSICAL   CHIEF COMPLAINT:  Frequent falls.  HISTORY OF PRESENT ILLNESS:  The patient is a 77 year old female with a history of severe hypertension, diabetes mellitus type 2, and Parkinson disease who is brought by her husband because of a fall this morning. She was found in the bathroom lying down on the floor and she hit her head just only.  The patient complains that she has been falling everyday for the last 4 days and she has very little strength in her upper and lower extremities.  She has no headaches, dizziness, focal weakness, numbness, or paresthesias.  She has a history of a stroke 4 years ago that left her with a mild left residual hemiparesis with good recovery after intensive physical therapy and rehabilitation.  The patient denies chest pain, shortness of breath, or palpitations.  She denies diaphoresis.  She has no fever, chills, or night sweats.  She denies cough or shortness of breath.  She denies changes in her bowel movements or in her urination.  PAST MEDICAL HISTORY:  Significant for, 1. Hypertensive urgency in February 2011. 2. Hypertension. 3. Fall with generalized weakness, needing skilled therapy. 4. Hypercalcemia secondary to hydrochlorothiazide which resolved and     diabetes mellitus type 2. 5. Parkinson disease, on Sinemet. 6. History of stroke with left hemiparesis 4 years ago. 7. Carotid artery disease with greater than 80% ICA stenosis in the     right and 40% to 59% stenosis on the left.  CURRENT MEDICATIONS: 1. Bisoprolol/hydrochlorothiazide 2.5/6.25 mg 1 tablet every morning. 2. Vitamin D3 over the counter once a  day. 3. Multivitamin once a day. 4. Fluoxetine 20 mg a day. 5. Xanax 0.25 mg 1 tablet nightly. 6. Lantus 42 units every morning. 7. Lisinopril 20 mg in the morning. 8. Sinemet 25/100 mg half tablet b.i.d. 9. Metformin 500 mg b.i.d. 10.Clonidine 0.3 mg 2 tablets equal of 0.6 mg b.i.d. 11.Lovastatin 20 mg a day.  ALLERGIES:  She is allergic to PENICILLIN which causes a rash.  FAMILY HISTORY:  Not pertinent.  SOCIAL HISTORY:  She lives with her husband at home and denies tobacco or alcohol use.  PHYSICAL EXAMINATION:  VITAL SIGNS:  Her blood pressure is 151/116, pulse 56, respirations 21, temperature 98.5. GENERAL APPEARANCE:  The patient is alert, disoriented, but engages in the interview appropriately.  She is slightly pale. ENT:  Unremarkable. NECK:  Supple, without JVD.  She has a carotid bruit bilaterally, but much greater on the right than the left.  The thyroid is without nodules. CHEST:  Symmetric, with a regular S1 and S2 without gallops, murmurs, or rubs.  Lungs clear to auscultation. ABDOMEN:  Soft, nontender, without organomegaly, or masses palpable. EXTREMITIES:  No clubbing, cyanosis, or edema.  Pulses 1+.  There  is tenderness on the left knee.  No bruises noted. NEUROLOGIC:  She has mild resting tremor with a very slight left hemiparesis.  I did not ambulate the patient.  LABORATORY DATA:  Shows a negative chest x-ray, negative cardiac enzymes.  Glucose of 102.  Negative left shoulder x-ray.  UA is negative with a specific gravity of 1018.  The sodium is 139, potassium 4.4, chloride 103, carbon dioxide 28, glucose 90, BUN 30, creatinine 0.91, calcium 10.0.  The CBC shows a white count of 9.9, hemoglobin 11.6, hematocrit 35.5, MCV 92.7, platelets 256.  CT head unremarkable.  ASSESSMENT: 1. Failure to thrive. 2. Falls, frequent. 3. Severe hypertension. 4. Parkinson disease. 5. Diabetes mellitus. 6. History of falls. 7. History of a stroke.  PLAN:  The  patient will be admitted for observation as the cause of frequent falls most likely is multifactorial.  We must consider substituting clonidine since she is on a high-dose and she is on high risk for orthostatic hypotension and falls as well as withholding statin.  In the morning, we will check a TSH and vitamin D level since low vitamin D levels are associated to frequent falls.  Another consideration will be discontinuing the use of psychotropic and sedatives including Xanax which the patient is on.  Consultations with Physical Therapy and Neurology in a.m.          ______________________________ Ardyth Gal, MD     GL/MEDQ  D:  08/29/2010  T:  08/29/2010  Job:  [id number]  Electronically Signed by Ardyth Gal MD on 09/14/2010 08:52:52 AM

## 2010-09-25 NOTE — Discharge Summary (Signed)
NAMEPHYLISHA, [name]               ACCOUNT NO.:  [id number]  MEDICAL RECORD NO.:  [id number]           PATIENT TYPE:  I  LOCATION:  [id number]                         FACILITY:  [facility]  PHYSICIAN:  Sheila Oats, M.D.DATE OF BIRTH:  1928-11-21  DATE OF ADMISSION:  08/29/2010 DATE OF DISCHARGE:                        DISCHARGE SUMMARY - REFERRING   DISCHARGE DIAGNOSES: 1. Frequent falls with failure to thrive. 2. Malignant hypertension. 3. Left maxillary sinusitis - bacterial versus fungal - the patient to     follow up with the ENT upon discharge for possible biopsy     outpatient. 4. Parkinson disease. 5. Diabetes mellitus. 6. History of hypercalcemia secondary to hydrochlorothiazide. 7. History of endometrial cancer - status post surgery, followed by     GYN/Oncology and to follow up with Radiation Oncology Outpatient as     directed. 8. History of cerebrovascular accident/right pontine infarction in the     past. 9. History of hyperlipidemia. 10.History of depression.  PROCEDURES AND STUDIES: 1. Left shoulder x-ray - no fracture. 2. Chest x-ray on April 17 - hyperinflation, no active disease or     change. 3. CT scan of head on April 17 - periventricular white matter,     hypodensities, most compatible with progressive chronic ischemic     microvascular white matter disease.  Faint hypodensity in the     thalami, asymmetric and likely chronic.  Chronic paranasal     sinusitis, especially involving the left maxillary sinus.  There     are calcified components which often favor fungal sinusitis. 4. X-ray of left knee - no evidence of acute fracture or subluxation. 5. MRI of the brain with and without contrast on April 18 - no acute     infarct.  No intracranial enhancing lesion or destructive lesion.     Remote right pontine infarct.  Progressive prominent white matter     type changes suggestive of small vessel disease.  Global atrophy     without hydrocephalus.   Opacification of left maxillary sinus with     extension through the ostium complex, having an appearance, raising     possibility of calcification/fungus.  Polypoid lesion not excluded.     Mild mucosal thickening, right maxillary sinus and ethmoid sinus     air cells bilaterally.  Cervical spondylotic changes with mild cord     flattening, C3-C4 and C4-C5.  BRIEF HISTORY:  The patient is a 77 year old white female with above- listed medical problems, who presented with frequent falls.  It was reported that she was found in the bathroom line on the floor and she hit her head.  It was reported that she had been falling every day for the 4 days prior to admission and had decreased strength in upper and lower extremities.  She denied headaches, focal weakness, numbness.  No paresthesias.  She also denied chest pain, shortness of breath, fevers, chills and no night sweats.  She was admitted for further evaluation and management.  HOSPITAL COURSE: 1. Frequent falls with failure to thrive - upon admission workup     included imaging studies - a CT scan and  MRI negative for acute     infarcts.  The patient had a TSH done and it came back within     normal limits at 1.87.  It was noted that the MRI of the brain     revealed a partially empty sella and as already mentioned, her TSH     came back normal and she had a cortisol level which was normal at     5.4 as well.  The patient, while in the hospital, was noted to have     some bradycardic episodes, down to the 40s and it was noted that     she was on very high doses of clonidine at 0.6 b.i.d. and so the     dose was decreased down to 0.2 b.i.d. and Norvasc was added for     blood pressure control.  With these changes, her bradycardia has     resolved.  PT/OT was consulted and saw the patient in the hospital     and recommended skilled nursing and the patient was agreeable to     this and is awaiting placement at this time. 2. Malignant  hypertension - the patient's blood pressures were     markedly elevated on admission and she required p.r.n. IV     antihypertensives during this hospital stay.  She was maintained on     her lisinopril as well as bisoprolol/hydrochlorothiazide.  It was     noted that she was on high doses of clonidine - 0.6 mg b.i.d. and     given that, she presented with frequent falls and clonidine has the     potential of causing hypotensive episodes, the dose was decreased     to 0.2 mg b.i.d. and Norvasc was added instead.  Her blood     pressures have been adequately controlled on this regimen.  Nursing     home/outpatient physicians to continue monitoring her blood     pressures and further adjust her medications as clinically     appropriate.  As already mentioned above, the patient also had     bradycardic episodes in the hospital which was one of the reasons     why her clonidine dose was decreased. 3. Left maxillary sinusitis - bacterial versus fungal.  This was noted     on CT scan and MRI and per Radiology, the calcifications were noted     and this could be a fungal sinusitis.  The patient's only symptom     was intermittent postnasal drainage and I discussed this with     Infectious Disease and they stated not to treat the patient with an     antifungal empirically but agreed with having the patient follow up     with the ENT once she is discharged from the hospital for possible     biopsy and further management as appropriate.  The patient has been     empirically treated for possible bacterial sinusitis at this time. 4. Parkinson disease - she was maintained on her outpatient     medications. 5. Diabetes mellitus - Accu-Cheks were monitored.  She was maintained     on Lantus during this hospital stay.  She was also covered with     sliding scale insulin.  It was noted that she was on outpatient     metformin as well but it was put on hold on admission and her blood     sugars have been  adequately controlled  off it and so she is to     continue holding of the metformin at this time and nursing     home/outpatient MD to continue monitoring her Accu-Cheks and     consider resuming the metformin when clinically appropriate for     optimal blood glucose control. 6. History of endometrial cancer - GYN/Oncology nurse followed up with     the patient during this hospital stay and gave her [facility] appointment.  She is to follow up with Dr. Sondra Come     today April 23 as directed. 7. Depression - she was maintained on fluoxetine during this hospital     stay.  DISCHARGE MEDICATIONS: 1. Tylenol 650 mg q.4 h. p.r.n. 2. Norvasc 10 mg p.o. daily. 3. Dulcolax 10 mg daily p.r.n. 4. Avelox 400 mg 1 p.o. daily through September 09, 2010. 5. Clonidine 0.2 mg 1 p.o. b.i.d. 6. Bisoprolol/hydrochlorothiazide 2.5/6.25 mg 1 p.o. q.a.m. 7. Carbidopa/levodopa 25/100 mg half a tablet p.o. b.i.d. 8. Fluoxetine 20 mg 1 capsule q.a.m. 9. Lantus 42 units q.a.m. 10.Lisinopril 20 mg 1 p.o. q.a.m. 11.Lovastatin 20 mg 1 p.o. q.h.s. 12.Multivitamins 1 p.o. daily. 13.Vitamin D3 one capsule daily. 14.Xanax 0.25 mg 1 p.o. q.h.s.  DISCONTINUED MEDICATIONS:  Metformin, nursing home MD to monitor and resume when appropriate as discussed above.  FOLLOWUP CARE: 1. Nursing home MD in 1 to 2 days. 2. Radiation Oncology as directed. 3. GYN/Oncology as directed.     Sheila Oats, M.D.     ACV/MEDQ  D:  09/04/2010  T:  09/04/2010  Job:  [id number]  cc:   Tamsen Roers Fax: [phone]  Blair Promise, Ph.D., M.D. [phone]  Electronically Signed by Minette Headland M.D. on 09/25/2010 10:54:48 AM

## 2010-09-26 NOTE — Assessment & Plan Note (Signed)
[name] returns to clinic today accompanied by her husband.  The  patient is a 77 year old right-handed female with history of prior TIA  and pontine infarct along with mild Parkinson's disease.  She has now  completed all outpatient therapy and plans to start an exercise program  at her local [employer] or health club.  She reports that her blood sugars  have been generally less than 100 in the morning and as high as  approximately 130 late in the afternoon.  She continues to be followed  by Dr. Tamsen Roers, her primary care physician.  She reports that she  is continent of bowel and bladder and able to do some laundry along with  some cooking at home.  She does need some help donning her ankle foot  orthosis on the left lower extremity and fixing her hair, but,  otherwise, she is independent with bathing and dressing.  She is walking  with a single point cane with a glove on her left upper extremity and  ankle foot orthosis on her left lower extremity.   REVIEW OF SYSTEMS:  Positive for constipation.   MEDICATIONS:  1. Tricor 48 mg daily.  2. Clonidine 0.3 mg two tablets b.i.d.  3. Metformin 500 mg b.i.d.  4. Niacin 500 mg.  5. Lovastatin 20 mg.  6. Effexor 75 mg daily.  7. Sinemet 25/100 one tablet b.i.d.  8. Aggrenox one tablet b.i.d.  9. Lisinopril 30 mg.  10.Lantus insulin 32 units q.a.m.  11.Apidra 5 units q.p.m  12.Multivitamin daily.  13.Fish oil daily.  14.Iron sulfate daily.   PHYSICAL EXAMINATION:  GENERAL:  A well-appearing elderly adult female  in no acute discomfort.  VITAL SIGNS:  Blood pressure 171/59 with pulse 59, respiratory rate 18,  O2 saturation 97% on room air.  MUSCULOSKELETAL:  She has 5/5 strength in the right upper and right  lower extremity.  Left upper extremity was 4-/5, left lower extremity  strength was 4/5.  The patient uses a compressive glove on her left  upper extremity and uses an ankle foot orthosis on left lower extremity.    IMPRESSION:  1. Status post right pontine infarct with left-sided weakness.  2. Hypertension.  3. Insulin-dependent diabetes mellitus.  4. Dyslipidemia.  5. Parkinson's disease.  6. Depression.   In the office today no refill on medication is necessary.  Will plan on  seeing her in followup on an as needed basis.  Will refer her back to  her primary care physician, Dr. Tamsen Roers.  She has completed all  therapy at this time and  has made excellent progress and should make some slightly increased  progress over the next several months leading up to the anniversary date  of her stroke.  Will plan on seeing her in followup on an as needed  basis.           ______________________________  [name], M.D.     DC/MedQ  D:  01/20/2007 11:23:21  T:  01/20/2007 13:49:07  Job #:  [id number]

## 2010-09-26 NOTE — Assessment & Plan Note (Signed)
Ms. [name] returns to the clinic today for followup evaluation  accompanied by her husband.  The patient is a 77 year old, right-handed  female with a history of TIA and a pontine infarct along with mild  Parkinson's disease.   The patient continues to attend outpatient physical and occupational at  the [facility] address.  She is mostly independent with bathing and  dressing and using a rolling walker along with the occasional use of a  cane in rehab.  She continues to use an ankle-foot orthosis on her left  lower extremity.  She has fairly significant swelling of her left hand,  but a prior compressive glove was too large and did not give her any  benefit.  She still would benefit from a pediatric-size compressive  glove to the left upper extremity at least distally for the swelling.  The patient continues to be treated by Dr. Kara Pacer, her primary care  physician.  She reports that her blood sugars have been in the 70 to 110  range at home.   REVIEW OF SYSTEMS:  Noncontributory.   MEDICATIONS:  1. Tricor 48 mcg daily.  2. Clonidine 0.3 mg two tablets b.i.d.  3. Metformin 500 mg b.i.d.  4. Niacin 500 mg daily.  5. Lovastatin 20 mg daily.  6. Effexor 75 mg daily.  7. Sinemet 25/100 one-half tablet b.i.d.  8. Aggrenox one tablet b.i.d.  9. Lisinopril 30 mg daily.  10.Lantus insulin 32 units q.a.m.  11.Apidra 5 units q.p.m.  12.Multivitamin daily.  13.Fish oil daily.  14.Iron sulfate daily.   PHYSICAL EXAMINATION:  GENERAL:  A reasonably well-appearing, elderly,  adult female seated in a regular chair using a rolling walker for  ambulation.  VITAL SIGNS:  Blood pressure is 148/59 with a pulse of 59, respiratory  rate 17, and O2 saturation 98% on room air.   The right upper and right lower extremity strength was 4+/5.  The left  upper and left lower extremity strength was 3+ to 4-/5.  She has  moderate swelling of the left hand distally.  She also wears an ankle-  foot  orthosis on the left lower extremity.   IMPRESSION:  1. Status post right pontine infarction.  2. Hypertension.  3. Insulin-dependent diabetes mellitus.  4. Dyslipidemia.  5. Parkinson's disease.  6. Depression.   In the office today, we did give the patient a slip for a pediatric-size  compressive glove for the left hand that she can present to the  occupational therapist at the [facility] address  and hopefully get  that on an outpatient basis.  She probably would be able to use her left  hand better if there was less swelling, which she reports is present  mostly later in the day and eased whenever she is first up in the  morning.  It tends to be mostly dependent and should respond to  a compressive hand glove.  We will plan on seeing the patient in  followup in this office in approximately two months time.           ______________________________  Jarvis Morgan, M.D.     DC/MedQ  D:  11/21/2006 11:51:16  T:  11/21/2006 21:17:24  Job #:  [id number]

## 2010-09-29 NOTE — Discharge Summary (Signed)
NAMEBRITTISH, PURVIANCE               ACCOUNT NO.:  [id number]   MEDICAL RECORD NO.:  [id number]          PATIENT TYPE:  IPS   LOCATION:  NA                           FACILITY:  [facility]   PHYSICIAN:  Pramod P. Leonie Man, MD    DATE OF BIRTH:  Jan 27, 1929   DATE OF ADMISSION:  07/13/2006  DATE OF DISCHARGE:  07/17/2006                               DISCHARGE SUMMARY   DIAGNOSES AT TIME OF DISCHARGE:  1. Right pontine infarct secondary to small-vessel disease.  2. History of pontine stroke in 2002.  3. Parkinson's disease, mild.  4. Diabetes.  5. Peripheral neuropathy secondary to diabetes with mild gait      unsteadiness.  6. Hypertension.  7. Dyslipidemia.   MEDICINES AT TIME OF DISCHARGE:  1. Clonidine 0.3 mg, 2 tablets q.4 h.  2. Lovastatin one a day.  3. Effexor XR 75 mg a day.  4. Lantus insulin 36 units nightly  5. Vitamin B12 2000 mcg a day.  6. Carbidopa/Levadopa 25/100 one p.o. p.r.n.  7. multivitamin one a day.  8. Calcium 600 with vitamin D one b.i.d.  9. Tricor 48 mg a day.  10.Lovenox 40 mg subcu a day.  11.Aspirin 81 mg a day x2 weeks then discontinue.  12.Aggrenox one p.o. q.h.s. x2 weeks then increase to b.i.d.  13.Tylenol 2 tablets 1 hour prior to Aggrenox dose times 1 week.  14.Glucophage 500 mg p.o. b.i.d.  15.Niacin 500 mg  a day.   STUDIES PERFORMED:  CT of the brain shows no acute abnormalities,  ethmoid and sphenoid sinusitis, chronic periventricular microvascular  disease.  MRI of the brain shows acute right pontine nonhemorrhagic  infarct.  Atrophy.  Prominent white matter type changes, probably  related to small-vessel disease.  Paranasal sinus opacification. MRA of  the head shows significant medium and small size vessel irregularity  consistent with prominent atherosclerotic type changes.  Distal left  vertebral artery diminutive in size with dominant right vertebral  artery.  Mild to moderate narrowing cavernous segment left internal  carotid artery.   MRI of the neck showed 64% diameter stenosis, proximal  right internal carotid artery.  Focal loss of signal consistent with  high-grade stenosis proximal vertebral arteries with the right vertebral  artery appearing dominant.  Tandem stenosis of the nondominant left  vertebral artery is suspected.  Moderate stenosis proximal right  subclavian artery with post stenotic dilatation.  Moderate narrowing  proximal external carotid artery bilaterally.  Moderate narrowing of the  left subclavian artery.  Carotid Doppler shows right internal carotid  with moderate heterogeneous plaque in the vessel, right external carotid  with moderate and heterogeneous plaque in the vessel.  In the right  internal carotid artery there was a 60-79% stenosis.  In the left  internal carotid artery there was a 40-59% stenosis.  Vertebral artery  flow was antegrade bilaterally.  A 2-D echocardiogram shows EF of 75%  with no left ventricular regional wall motion abnormalities.  No  echocardiographic source of embolus. EKG shows sinus bradycardia.  Cannot rule out anterior infarct.  Transcranial Doppler performed,  results pending.  LABORATORY STUDIES:  Hemoglobin 11.0, hematocrit 31.9, white blood cells  7.6, platelets 261.  Differential was eosinophils 6, otherwise normal.  Chemistry with sodium 134, glucose 165, otherwise normal.  Coagulation studies normal.  Liver function tests with alkaline  phosphatase 38, otherwise normal.  Cardiac enzymes negative.  Cholesterol 192, triglycerides 385, HDL 38 and LDL 77.  Urinalysis had 0  to 2 white blood cells, otherwise normal.  Homocystine 9.7.  Alcohol  level less than 5.  Hemoglobin A1c 8.5.   HISTORY OF PRESENT ILLNESS:  [name] is a 77 year old right-  handed Caucasian female with a past medical history of TIA, pontine  stroke and mild Parkinson's disease.  She was in her normal state of  health until the day prior to admission when she noticed she was   increasingly unsteady on her feet.  She noted she was walking into walls  often veering to the left when she was trying to ambulate.  She noticed  that she was having a bit of pain in the left knee also.  She was not  aware of any focal weakness.  The morning of admission her symptoms were  a little bit worse and she actually fell once.  Her husband said that  very transiently she had some slurred speech for about 5 minutes, but  that has not recurred.  Nevertheless, she became concerned and presented  to [facility] emergency room for evaluation.  She was not a TPA  candidate secondary to time greater than 3 hours.  She was admitted to  the hospital for further stroke evaluation.   HOSPITAL COURSE:  MRI revealed an acute right pontine infarct felt to be  secondary to small-vessel disease as she has poorly controlled diabetes  as well as hypertension and dyslipidemia with significantly elevated  triglycerides.  She had some mild carotid stenosis, right ICA 60-80% and  left 40-60%.  She was started on Aggrenox secondary stroke prevention.  Niacin and Tricor were added for cholesterol control.  She was evaluated  by PT and OT and felt she would benefit from inpatient rehab.  During  the hospital stay her left leg weakness has progressively worsened  likely due to small-vessel disease.  The patient was stable for transfer  to rehab and will be sent there for continuation of therapies.   CONDITION ON DISCHARGE:  The patient was alert and oriented x3.  Mild  left facial weakness.  Left upper extremity flaccid at 0/5 and left  lower extremity 1-2/5.  She has good movement on her right side.   DISCHARGE/PLAN:  1. Transfer to rehab for continuation of PT, OT and speech therapy.  2. Aggrenox for secondary stroke prevention.  3. Niacin and Tricor this admission for cholesterol control.  4. It appears as if Glucophage may have been added this admission.     Regardless the patient needs tight  hemoglobin A1c control with goal      less than 6.5 and the prior one put goal LDL less than 100 and goal      triglycerides less than 150.  5. Follow-up with primary care physician after discharge from rehab      for risk factor control.  6. Follow up with Dr. Mary Sella in 2-3 months.      Burnetta Sabin, N.P.    ______________________________  Kathie Rhodes. Leonie Man, MD    SB/MEDQ  D:  07/17/2006  T:  07/17/2006  Job:  [id number]   cc:   Mamie Nick  Royal Hawthorn, MD  Tamsen Roers

## 2010-09-29 NOTE — Discharge Summary (Signed)
[name], [name] NO.:  [id number]   MEDICAL RECORD NO.:  [id number]          PATIENT TYPE:  IPS   LOCATION:  4034                         FACILITY:  [facility]   PHYSICIAN:  Jarvis Morgan, M.D.   DATE OF BIRTH:  [birth year]   DATE OF ADMISSION:  07/17/2006  DATE OF DISCHARGE:  08/01/2006                               DISCHARGE SUMMARY   DISCHARGE DIAGNOSES:  1. Acute right pontine infarction.  2. Hypertension.  3. Insulin-dependent diabetes mellitus.  4. Hyperlipidemia.  5. Parkinson's disease.  6. Depression.  7. Subcutaneous Lovenox for deep vein thrombosis prophylaxis.   This is a 77 year old right-handed female with history of transient  ischemic attack, pontine infarction, mild Parkinson's disease who was  admitted March 1 with unsteady gait and slurred speech.  MRI with acute  right pontine non-hemorrhagic infarction.  MRA with 64% diameter  stenosis proximal right internal carotid artery.  Carotid Dopplers with  right internal carotid artery stenosis of 60-80%, left of 40-60%.  Echocardiogram with ejection fraction of 75% without emboli.  Neurology  consulted, placed on Aggrenox as well as aspirin with plan to increase  Aggrenox to b.i.d.  Placed on subcutaneous Lovenox for deep vein  thrombosis prophylaxis.  Trial of Sinemet twice daily for Parkinson's  disease.   PAST MEDICAL HISTORY:  See discharge diagnoses.  No alcohol.  Remote  smoker.   SOCIAL HISTORY:  Married, one level home, 6 steps to entry.  [name] can  assist as needed.  The patient was independent and driving prior to  admission.   MEDICATIONS PRIOR TO ADMISSION:  1. Clonidine 0.3 mg two tablets twice daily.  2. Lovastatin 20 mg twice daily.  3. Aspirin 81 mg daily.  4. Effexor 75 mg daily.  5. Lantus insulin 36 units daily.  6. Niacin 500 mg daily.  7. Multivitamin daily.  8. Vitamin B-12 2000 mcg daily.  9. Os-Cal 600 mg twice daily.   REHABILITATION HOSPITAL COURSE:  The  patient was admitted to inpatient  rehab services with therapies initiated on a 3-hour daily basis  consisting of physical therapy, occupational therapy, speech therapy and  rehabilitation nursing.  The following issues were addressed during the  patient's rehabilitation stay.  Pertaining [name]'s right pontine  infarction, remained stable.  Her Aggrenox was to be increased to twice  daily on August 01, 2006.  She had been on aspirin therapy until Aggrenox  increased.  Her blood pressures were monitored with diastolics of 54 to  69 as she continued on her clonidine as well as lisinopril at 30 mg  daily.  Blood sugars monitored at 101 and 163.  She remained on her  Lantus insulin as prior to hospital admission and would follow up with  Dr. Kara Pacer in [place], [place].  She remained on subcutaneous  Lovenox for deep vein thrombosis prophylaxis throughout her rehab  course.  She had been placed on trials of Sinemet for mild Parkinson's  disease by Neurology Services and she would follow up on an outpatient  basis.  She remained on her Zocor for hyperlipidemia.  Overall, for her functional status, she was minimal assist for bed  mobility, moderate assist for chair transfers, moderate assist for  dynamic standing balance, ambulating moderate assist 43 feet and  moderate assist for wheelchair mobility.  She was moderate assist for  activities of daily living.  She was followed by Speech Therapy for her  slurred speech.  She was not intelligible for simple words, but was  still having quite a bit of difficulty overall.  She was maintained on a  regular diabetic diet which she tolerated well.   LABORATORY DATA:  Latest labs showed a sodium 140, potassium 3.9, BUN  16, creatinine of 0.7, hemoglobin 10.7, hematocrit 30.6, platelet  187,000.   DISCHARGE MEDICATIONS:  1. Tricor 48 mg daily.  2. Clonidine 0.6 mg twice daily.  3. Glucophage 500 mg twice daily.  4. Multivitamin  daily.  5. Niacin 500 mg at bedtime.  6. Zocor 20 mg daily.  7. Effexor 75 mg daily.  8. Sinemet 25-100 twice daily.  9. Aggrenox one capsule twice daily.  10.Lantus insulin 32 units at bedtime.  11.Lisinopril 30 mg daily.   DISCHARGE DIET:  Diabetic diet.   DISCHARGE INSTRUCTIONS:  She was advised no smoking, no drinking, no  driving, no alcohol.   DISCHARGE FOLLOWUP:  She would follow up with Dr. Jarvis Morgan at  [facility]; Dr. Willaim Rayas, Neurology Services; Dr. Kara Pacer  for all medical management in [place], [place].      Lauraine Rinne, P.A.    ______________________________  Jarvis Morgan, M.D.    DA/MEDQ  D:  07/31/2006  T:  07/31/2006  Job:  [id number]   cc:   Jarvis Morgan, M.D.  Michael L. Doy Mince, M.D.  Jeneen Rinks Little

## 2010-09-29 NOTE — Discharge Summary (Signed)
[place]  Patient:    MONTANNA, HELLWIG Visit Number: [id number] MRN: [id number]          Service Type: MED Location: [id number] Attending Physician:  Doyce Para Dictated by:   Doyce Para, M.D. Admit Date:  04/04/2001 Discharge Date: 04/05/2001   CC:         Zigmund Gottron. Arelia Sneddon, M.D.  Elvia Collum, M.D.   Discharge Summary  DATE OF BIRTH:  11/19/2028.  DISCHARGE DIAGNOSES:  1. Syncopal episode     a.  Suspect hypoglycemia related versus unknown etiology.  2. Cerebrovascular disease and history of transient ischemic attacks.     a.  Head CT scan this admission: no acute disease, small vessel disease.     b.  Magnetic resonance imaging (8/02):  Acute lacunar infarct right pons,     small vessel disease.     c.  Carotid Dopplers (8/02): No significant stenosis.  3. Acute on chronic right neck pain.     a.  X-rays negative (done because of fall with syncope).     b.  Recent strenuous activity likely cause.  4. Parkinsons, mild.     a.  Diagnosed 8/02.  5. Hypertension.  6. Diabetes mellitus, type 2.     a.  Duration 6 years.     b.  Glycohemoglobin 6.3%     c.  TSH pending.  7. Peripheral neuropathy.  8. Gait disorder and falls, secondary to above-mentioned problems.  9. Hypertension.     a.  20 mm drop in systolic pressure with standing.     b.  Asymptomatic. 10. Gastroesophageal reflux disease. 11. Depression. 12. ALLERGY:  PENICILLIN. 13. Mild normocytic anemia (hemoglobin 11.3, MCV 93.7).  DISCHARGE MEDICATIONS:  1. (New), Plavix 75 mg q.d. - patient was supposed to be on this in addition     to aspirin according to Dr. Vista Deck records, miscommunication.  2. (Dosage change), Amaryl - if morning blood sugar is less than 110 do not     take any, if it is greater than 110 begin taking amaryl 2 mg one-half     tablet each morning (previously on amaryl 4 mg p.o. q.a.m.).  3. Carbidopa/levodopa 25/100, one-half tablet t.i.d.  4. Atenolol 50 mg q.a.m.  5. Lopid twice a day as before (patient tells me she was on 1000 mg but this     is supplied only in 600 mg pills - let Dr. Arelia Sneddon sort this out.  6. Enteric coated aspirin 325 mg q.d.  7. Hydrochlorothiazide 50 mg one-half tablet q.a.m.  8. Neurontin 300 mg one in the morning and one in the evening.  9. Effexor XR 75 mg q.d. 10. Protonix 40 mg q.d. 11. Darvocet-N 100 two pills q.4h p.r.n. pain, (maximum 8 per day).  CONDITION UP DISCHARGE:  Stable.  Lying blood pressure 179/75, heart rate 63; sitting 189/75, heart rate 67 and standing 153/79, heart rate 63.  Morning CBG 106.  DISPOSITION:  Home with husband.  RECOMMENDED ACTIVITY:  As tolerated.  No driving until follow up with Dr. Doy Mince.  RECOMMENDED DIET:  1800 calorie low salt, diabetic diet.  Drink plenty of water.  SPECIAL INSTRUCTIONS:  Return or call Dr. Arelia Sneddon if any problems.  FOLLOW UP:  1. Call Dr. Claris Gower on Monday to schedule a follow up later this week.     He will need to check the patients blood pressure as well as the blood  sugars and review the chart to see what baseline hemoglobin is.  Would     also strongly consider starting an ACE inhibitor in this diabetic with     hypertension and cerebrovascular disease.  2. Call Dr. Vista Deck to schedule follow up in one to two weeks.  I did     discuss the case with him on admission.  He may want to consider doing     an electroencephalogram as an outpatient.  CONSULTANTS:  None.  PROCEDURES:  1. Electrocardiogram:  Normal sinus rhythm. Rate 72.  2. 2D echocardiogram:  Preliminary verbal report from Dr. Mare Ferrari, no     significant abnormalities, mild valvular regurgitation.  3. Head CT scan without contrast:  No acute disease.  Small vessel changes.  IMPRESSION: #1 - SYNCOPE:  The patient is a 77 year old female who presents with a syncopal episode.  She awoke in the morning and felt like she had possibly a low blood  sugar and went into the kitchen to eat a banana and passed out.  On EMS arrival her blood sugar was 140 but she tells me she has been having some lows and these spells in the morning.  On presentation she denies any chest pain or shortness of breath.  Electrocardiogram is normal.  Serial cardiac enzymes were negative.  The 2D echocardiogram was essentially negative.  There was no evidence of diminished cardiac function or wall motion abnormalities. Head CT scan was also negative.  She was recently seen by Dr. Vista Deck back in August of 2002 with complaints of dizziness.  He did a complete CNS vascular work up to include an magnetic resonance imaging which did show at that time an acute lacunar infarct in the right pons.  Carotid Dopplers were negative.  She had extensive small vessel disease in the white matter, pons and cerebellum.  He had instructed the patient to take aspirin and Plavix. The patient understood him to say stop the Plavix and continue the aspirin.  I have clarified this and the patient will go home on both.  Sinemet was started in August as well and that can sometimes cause some orthostatic hypotension. She is orthostatic by blood pressure with a drop of about 20 but her standing pressures remain high in the 150 to 160 range.  I do not think this was an orthostatic spell.  At this point she is stable and my best assessment is that this is likely related to hypoglycemia.  I would like the patient to follow up with Dr. Vista Deck.  He may consider getting an electroencephalogram, but again she had no witnessed seizure activity and was seen immediately after her fall.  She will return if problems recur.  #2 - CEREBROVASCULAR DISEASE:  The patient will go home on aspirin and Plavix and follow up with Dr. Doy Mince.  #3 - DIABETES MELLITUS:  The patient has a glycohemoglobin of 6.3%.  This  actually falls within the normal range of 4.6 to 6.5, also suggesting that  she may be having some significant lows.  At this time she has already personally stopped her glucophage.  She was on amaryl 4 mg daily and I will go ahead and have her check her morning sugars.  If they are less than 110 she will not take anything for right now.  If it is greater than 110 she will start on amaryl 1 mg daily.  I suspect she will need to be on monotherapy, the question is whether or  not glucophage or  amaryl would be best in the long run.  I will leave this decision to Dr. Arelia Sneddon.  #4 - HYPERTENSION:  The patient does have some mild orthostasis which is asymptomatic because her standing blood pressure is 160.  She has a peripheral neuropathy from her diabetes and may have an element of autonomic neuropathy. This, in combination with her balance difficulties from her cerebrovascular disease and neuropathy, make her a high fall risk.  For this reason I would opt to treat her standing blood pressure only but I would like to see this value around 130 given her cerebrovascular disease.  She is on atenolol right now.  She is adequately beta-blocked with a heart rate in the 60s.  At this point I will defer further management to Dr. Arelia Sneddon whom the patient will see next week.  Certainly, given her diabetes and cerebrovascular disease an ACE inhibitor would be an excellent choice for her.  She is already on a diuretic. I would, incidentally, monitor her potassium levels on the diuretic and certainly her renal function if an ACE inhibitor is added.  #5 - PARKINSONS DISEASE:  Mild in nature.  Continue Sinemet.  DISCHARGE LABORATORY DATA:  Glycohemoglobin 6.3.  Hemoglobin 11.3, MCV 93, White blood cell count 6600, platelet count 219.  Sodium 139, potassium 3.8, chloride 98, bicarb 31, BUN 12, creatinine 0.9, glucose 104.  ELECTROCARDIOGRAM:  Electrocardiogram at the time of discharge shows some nonspecific changes in ST segments in lead III and AVF, but these were nonsignificant  in my opinion.  Dictator spent greater than 30 minutes arranging discharge, dictating, discussing findings and plan with patient. Dictated by:   Doyce Para, M.D. Attending Physician:  Doyce Para DD:  04/05/01 TD:  04/07/01 Job: [id number]

## 2010-09-29 NOTE — H&P (Signed)
[name], EDENBURN NO.:  [id number]   MEDICAL RECORD NO.:  [id number]          PATIENT TYPE:  EMS   LOCATION:  MAJO                         FACILITY:  [facility]   PHYSICIAN:  Michael L. Reynolds, M.D.DATE OF BIRTH:  1928-05-15   DATE OF ADMISSION:  07/12/2006  DATE OF DISCHARGE:                              HISTORY & PHYSICAL   CHIEF COMPLAINT:  Unsteady gait.   HISTORY OF PRESENT ILLNESS:  This is the second [facility]  admission for this 77 year old woman with a past medical history which  includes TIAs and a small pontine stroke, as well as mild Parkinson  disease.  She was in her normal health until yesterday, when she noticed  that she was increasingly unsteady on her feet.  She noticed she was  walking into walls and often veering to the left when she was trying  to ambulate.  She also noticed that she was having a little bit of pain  in the left knee.  She was not aware of any focal weakness or numbness  or any focal sensory changes at that time.  This morning, her symptoms  were a little bit worse and she actually fell once.  Her husband said  that very transiently she had some slurred speech for about five minutes  this morning, but that has not recurred, other than that she has had no  other symptoms.  Nevertheless, she became concerned and presented to the  [facility] Emergency Department for evaluation.  She thinks her symptoms  may be somewhat better.  There is no associated nausea, vomiting,  headache, alteration or consciousness, chest pain, palpitation, or  shortness of breath.   PAST MEDICAL HISTORY:  Remarkable for:  1. History of pontine stroke back in 2002, at which time she was      having TIA symptoms and did present with dizziness.  2. She also has history of Parkinson disease, which is mild and for      which she takes medications as needed.  3. She has a history of diabetes with resultant peripheral neuropathy      and a mild  gait unsteadiness.  4. She also has a history of hypertension and high cholesterol.   FAMILY HISTORY:  Remarkable for diabetes and coronary artery disease.   SOCIAL HISTORY:  She lives with her husband.  She is normally  independent in her activities of daily living.  There is no history of  tobacco use.   ALLERGIES:  PENICILLIN.   MEDICATIONS:  1. Baby aspirin.  2. Clonidine 0.3 mg two tablets b.i.d.  3. Lovastatin 20 mg daily.  4. Effexor XR 75 mg daily.  5. Lantus insulin 36 units daily.  6. Niacin 500 mg daily.  7. Multivitamin daily.  8. Vitamin B12, D, and calcium.   REVIEW OF SYSTEMS:  Full review of systems is negative, except as  outlined above in the HPI and in the accompanying admission nursing  record, which is reviewed.   PHYSICAL EXAMINATION:  VITAL SIGNS:  Temperature 97.7, blood pressure  188/88, pulse 72, respirations 16.  GENERAL:  This is a healthy appearing woman who seemed in no distress.  HEAD:  Cranium is normocephalic and atraumatic.  Oropharynx benign.  NECK:  Supple without carotid bruits.  HEART:  Regular irregular without murmurs.  CHEST:  Clear to auscultation bilaterally.  ABDOMEN:  Soft, normoactive bowel sounds.  EXTREMITIES:  No edema, 2+ pulses.  NEUROLOGICAL:  Mental status:  She is awake and alert.  She is fully  oriented to time, place, and person.  Recent and remote memory are  intact.  Attention span and concentration, fund of knowledge are all  appropriate.  She is able to name objects and repeat phrases.  Mood is  euthymic and affect appropriate.  Cranial nerves:  Pupils are equal and  reactive.  Extraocular movements are full without nystagmus.  Visual  fields full to confrontation.  Face, tongue, and palate move normally  and symmetrically.  Motor:  Normal bulk and tone.  She does have a very  mild left hemiparesis, which I think has been described before, although  perhaps not to this severity.  Sensation:  She reports patchy  areas of  decreased pinprick sensation on the left upper and lower extremity  compared to the right.  There is no extinguishing to double simultaneous  stimulation on the left.  Coordination:  Rubbing movements were  performed slowly on the left.  Finger-to-nose performed accurately.  Reflexes are brisk on the left.  Toe is down on the right and neutral on  the left.  Gait:  She arises slowly from a chair.  She is able to  ambulate without assistance and get around with a pretty steady gait in  the examining room.   LABORATORY REVIEW:  CBC:  White count 7.6, hemoglobin 11.0, platelets  261,000.  BMET is remarkable, except for an elevated glucose of 165.  Urinalysis is negative.  CT of the head demonstrates old white matter  disease and lacunar infarcts with no acute changes.   IMPRESSION:  Acutely unsteady gait.  This could be due to the fact that  her leg was hurting, but I am primarily concerned that this does  represent a new small vessel stroke.  She should be worked up for this.   PLAN:  Will admit for observation, mostly to make sure that her gait  remains okay and to get an MRI and see if she has had a new vascular  event.  If she has, she will need to be worked up for this.  If not,  perhaps she can be discharged tomorrow depending on her progress.      Michael L. Doy Mince, M.D.  Electronically Signed     MLR/MEDQ  D:  07/13/2006  T:  07/13/2006  Job:  [id number]

## 2010-09-29 NOTE — Assessment & Plan Note (Signed)
[name] returns to clinic today accompanied by her husband.  The  patient is a 77 year old, right-handed adult female with history of TIA  along with pontine infarct and mild Parkinson's disease.  She was  admitted July 13, 2006, with unsteady gait and slurred speech.  MRI  study showed an acute right pontine non-hemorrhagic infarction.  MRA  study showed 64% stenosis of the proximal right internal carotid artery.  The carotid Doppler studies showed right internal carotid artery  stenosis of 60% to 80% and left of 40% to 60%.  Echocardiogram showed an  ejection fraction of 75% without emboli.  The patient was placed on  Aggrenox therapy along with aspirin by neurology.  She eventually had  the aspirin discontinued and continued on Aggrenox twice a day.  A trial  of Sinemet was started for mild Parkinson's disease.   The patient stabilized and was moved to the rehabilitation unit July 17, 2006, and remained there through discharge, August 01, 2006.   Since discharge, the patient has received advanced home health therapy  from occupational and physical therapy.  She is making good progress.  She is demonstrating increased left upper extremity range of motion of  all joints as well as increased functional use.  She is very motivated.  She requires some assistance for ADLs and is contact-guard assistance  for sit-to-stand transfers along with bed mobility and ambulation.  She  was able to walk 200 yards today to the clinic from the parking spot.   REVIEW OF SYSTEMS:  Noncontributory.   MEDICATIONS:  1. TriCor 48 mg daily.  2. Clonidine 0.3 mg 2 tablets b.i.d.  3. Metformin 500 mg 1 tablet b.i.d.  4. Niacin 500 mg daily.  5. Lovastatin 20 mg daily.  6. Effexor 75 mg daily.  7. Sinemet 25/100 one-half tablet b.i.d.  8. Aggrenox 1 tablet b.i.d.  9. Lisinopril 30 mg daily.  10.Lantus insulin 32 units q.a.m.  11.Apidra insulin 5 units q.p.m.  12.Multivitamin daily.  13.Fish oil  daily.  14.Iron sulfate daily.   PHYSICAL EXAMINATION:  GENERAL:  Well appearing elderly female in mild  to no acute discomfort.  VITAL SIGNS:  Blood pressure 133/60, pulse 62, respiratory rate 17,  oxygen saturation 98% on room air.  MUSCULOSKELETAL:  Right upper and right lower extremity strength was  5/5.  Bulk and tone were normal, and reflexes were 2+ and symmetrical.  Left upper extremity strength was 4/5 to 4+/5, and left lower extremity  strength was 4+/5.  The patient had slightly increased tone on the left  compared to the right.  Her finger movements were slow on the left  compared to the right.  NEUROLOGIC:  Sensation was intact to light touch on the left.  The  patient ambulates with a rolling walker with some contact-guard  assistance.   IMPRESSION:  1. Status post right pontine infarction.  2. Hypertension.  3. Insulin-dependent diabetes mellitus.  4. Dyslipidemia.  5. Parkinson's disease.  6. Depression.   In the office today, the patient has made excellent progress in home  health therapies and we are setting her up for outpatient therapies at  [facility], to include occupational and physical therapy 3 times per  week.  Her husband is able to provide transportation for that therapy.  We will continue to see her in followup in approximately 2 months' time.  She will continue on her Parkinson's meds.  She has been able to follow  up with Dr. Kara Pacer,  her primary care physician, at least twice since  being discharged from the hospital.  She is continent of bowel and  bladder and is making excellent program in terms of ADLs and mobility.           ______________________________  Jarvis Morgan, M.D.     DC/MedQ  D:  09/16/2006 11:49:04  T:  09/16/2006 12:24:17  Job #:  [id number]

## 2010-09-29 NOTE — H&P (Signed)
[facility]  Patient:    [name] Visit Number: [id number] MRN: [id number]          Service Type: MED Location: [id number] Attending Physician:  Doyce Para Dictated by:   Doyce Para, M.D. Admit Date:  04/04/2001   CC:         [name], M.D.  [name], M.D.   History and Physical  CHIEF COMPLAINT: "I passed out."  HISTORY OF PRESENT ILLNESS: [name] is a 77 year old Caucasian female, who was diagnosed with TIAs when she presented with dizziness in August 2002. Dr. Vista Sherman saw her at that time and also felt that she had some Parkinsons disease and put her on Sinemet, as well as a peripheral neuropathy and began Neurontin.  She has had some difficulty with dizziness and ataxia, as mentioned above.  This morning [name] awoke and felt hungry and went into the kitchen to eat a banana.  The next thing she knew her husband and the ambulance workers were standing over her.  Her husband heard a thump in the kitchen and came to her side.  He found her unconscious.  When she came to she was confused and did not know where she was, and had some mild slurring of her speech.  There was no witnessed seizure activity.  A capillary blood glucose on EMS arrival was 140 and her blood pressure was 160/100 with a heart rate of 74 and respiratory rate was 18.  At this time she denies any recollection of the event.  She had no chest pain or palpitations.  No shortness of breath. She has no focal weakness at this time.  She is complaining of a "kink" on the right side of her neck causing pain and also a headache.  She does not remember hitting her head and does not have any bumps or sore spots on her head, but did say that two days ago she did some fairly strenuous housework and that may be causing some difficulty.  She has no previous history of syncope.  No history of recent trauma or lower extremity swelling.  No history of blood  clots.  She does tell me that her sugars have been low in the morning, running as low as 67.  She has not checked them in the middle of the night.  In the afternoon she runs around 120.  She has stopped her Glucophage for this reason.  ALLERGIES: PENICILLIN.  CURRENT MEDICATIONS:  1. Enteric-coated aspirin 325 mg p.o. q.d.  2. Darvocet one to two p.o. q.4h p.r.n. pain.  3. Lopid ? 1000 mg b.i.d. (the pharmacy says it only comes in 600 mg pills).  4. Atenolol 50 mg q.a.m.  5. Neurontin 300 mg one in the morning and one in the evening (she has been     trying to taper off the evening dose because her feet hurt less).  6. Effexor XR 75 mg q.d.  7. Protonix 40 mg q.d.  8. Hydrochlorothiazide 50 mg 1/2 tablet q.d.  9. Levodopa & carbidopa 25/100 mg 1/2 tablet p.o. t.i.d. 10. Amaryl 4 mg p.o. q.a.m. 11. (I spoke with Dr. Vista Sherman and the patient was supposed to continue     taking Plavix 75 mg p.o. q.d.  She had gotten the message that she was     supposed to discontinue this and resume aspirin.  This was back in     August 2002).  PAST MEDICAL HISTORY:  1. Cerebrovascular disease/TIAs.     a. History of dizziness.     b. MRI (August 2002), small vessel disease in white matter pons and        cerebellum.  Acute lacunar infarct, right pons.     c. Carotid Dopplers negative.     d. Supposed to be on aspirin and Plavix.  (Patient taking aspirin only).  2. Parkinsons, mild.     a. Diagnosed August 2002.  3. Organic gait disorder, multifactorial (see previous).  4. Diabetes mellitus, type 2.     a. Duration six years.     b. Peripheral neuropathy.  5. Hypertension.  6. Gastroesophageal reflux disease.  7. Depression ?  SOCIAL HISTORY: [name] is married.  She is a nonsmoker, nondrinker. She and her husband used to do a lot of traveling but since she has had diabetes it has been more difficult.  She has two daughters in the [place] area.  FAMILY HISTORY: No history  of syncope of sudden death.  Positive for diabetes and heart disease.  REVIEW OF SYSTEMS: [name] denies any visual changes.  She is having headache on the right side as described as well as right neck pain.  No dysphagia.  No cough.  No hemoptysis.  No hematemesis.  No chest pain.  No abdominal pain. No change in bowel habits.  No melena.  No bright red blood per rectum.  No urinary symptoms.  No swollen joints.  No lower extremity edema.  No history of thyroid disease.  No history of heart attack.  No history of kidney or liver disease.  No history of seizures.  Review Of Systems except as mentioned above is otherwise negative.  PHYSICAL EXAMINATION:  GENERAL: Pleasant and cooperative.  No acute distress.  VITAL SIGNS: In the emergency department T was 97.0 degrees, BP 189/81, pulse 72, respiratory rate 18.  Oxygen saturation 97% on room air.  HEENT: Atraumatic, normocephalic.  Cranial nerves 2-12 grossly intact.  Mucous membranes moist.  Fundi unremarkable.  Sclerae clear.  Pharynx clear.  NECK: Supple.  No adenopathy.  No mass.  No audible bruits.  She does have palpable tenderness over the left posterior spinous muscles.  I do feel some knots related to probable muscle spasm.  LUNGS: Clear bilaterally with fair air movement.  No crackles, wheeze, or rhonchi.  HEART: Regular.  Normal S1 and S2.  No audible murmurs, rubs, or gallops.  ABDOMEN: Bowel sounds present.  No bruits.  Nontender, nondistended.  No palpable or percussible organomegaly.  GU/RECTAL: Deferred at this time.  EXTREMITIES: No rash.  No cords.  No edema.  MUSCULOSKELETAL: No deformity.  No fasciculations.  No atrophy.  NEUROLOGIC: Cranial nerves as above.  Mental status, alert and oriented x 3 and appropriate.  Motor examination reveals 5/5 strength bilaterally.  There is no pronator drift.  Sensation is diminished in the feet bilaterally.  Difficult to tell in the hands.  May be some diminished  two-point discrimination.  Reflexes are decreased at the ankle but 1+ at the knees. Symmetric.  Bicep reflexes 1+.  There is some mild cogwheeling but this is not significant.  Toes are downgoing.  Gait not assessed.  LABORATORY DATA: EKG normal.  Head CT, small vessel disease, no acute abnormalities.  Hemoglobin 12.8, WBC 8900, platelet count 236,000.  PT 12.3, PTT 31.  Sodium 136, potassium 3.8, chloride 98, carbon dioxide 30, glucose 146, BUN 14, creatinine 0.9.  Calcium 9.3.  CK 47, CK-MB 0.9, troponin  I less than 0.1.  IMPRESSION:  1. Syncopal episode.  [name] is a 77 year old female with diabetes     mellitus as well as cerebrovascular disease, mild Parkinsons disease, and     peripheral neuropathy.  She had a syncopal episode in the morning.  It     occurred without warning and afterward she had some mild slurred speech     transiently and confusion.  Otherwise, there were no remarkable issues of     findings.  Concerns in this woman include cerebrovascular disease.  I did     speak with Dr. Vista Sherman and we discussed the recent extensive work-up     in August 2002.  She had an MRI showing small vessel disease.  She had     negative carotid Dopplers.  The only thing interesting about that is she     was supposed to be on aspirin and Plavix and there was a miscommunication     and she was only taking aspirin.  I doubt very much, however, that this     syncopal episode was related to cerebrovascular disease given the fact     that she has no residual neurologic symptoms.  We will continue to follow     this closely.  Another possibility includes hypoglycemia.  If her sugars     have been running low and low in the morning in the 60s and only 120 in     the afternoon she may be overtreated.  I will get a glycosylated     hemoglobin to see if this is lower than would be expected in a fairly well     controlled diabetic.  We will go ahead and reduce her Amaryl dose to 2  mg     in the morning and check a 2 a.m. CBG.  Certainly given her central     nervous system vascular disease she could have vascular disease elsewhere.     Electrocardiogram was completely normal and cardiac enzymes were     negative.  Diabetics do not always have chest pain, so we will go ahead     and rule her out.  I have ordered a 2D echocardiogram.  If these studies     are normal I would not pursue any further cardiac work-up.  In discussions     with Dr. Vista Sherman, on occasional cerebrovascular disease can lead to     seizure activity.  Again, the husband was on the scene momentarily and did     not witness any seizure activity but we will get an electroencephalogram     for completeness.  If the above studies are unremarkable or negative I     will plan to discharge her home.  We may not ever have an etiology for     this syncopal episode but I think the above thorough work-up is     appropriate.  2. Diabetes mellitus.  As I said above, I will check a glycosylated     hemoglobin.  I will also check a thyroid-stimulating hormone.  Watch     for lows.  3. Hypertension.  Blood pressure was up.  Not orthostatic.  I should have     mentioned above there was evidence of hypotension or orthostasis.  She     was recently started on Sinemet, which can cause some hypotension, but     I would have imagined we would have picked that up on examination  initially.  I have increased her Atenolol.  We will watch her heart rate.  4. Gastroesophageal reflux disease.  Continue Protonix.  5. Parkinsons.  Continue Sinemet for now. Dictated by:   Doyce Para, M.D. Attending Physician:  Doyce Para DD:  04/04/01 TD:  04/05/01 Job: [id number]

## 2010-09-29 NOTE — H&P (Signed)
NAME:  [name], GOODNO NO.:  [id number]   MEDICAL RECORD NO.:  [id number]          PATIENT TYPE:  IPS   LOCATION:  [id number]                         FACILITY:  [facility]   PHYSICIAN:  Jarvis Morgan, M.D.   DATE OF BIRTH:  11/27/1928   DATE OF ADMISSION:  07/17/2006  DATE OF DISCHARGE:                              HISTORY & PHYSICAL   PRIMARY CARE PHYSICIAN:  Tamsen Roers, M.D. in [place], [place].   NEUROLOGIST:  Audrie Lia. Doy Mince, M.D.   HISTORY OF PRESENT ILLNESS:  Ms. [name] is a 77 year old, right-handed  adult female with a history of TIA and prior pontine stroke, with mild  Parkinson's disease an insulin-dependent diabetes mellitus.  The patient  was admitted on July 13, 2006, with unsteady gait and slurred speech,  with left-sided weakness.  An MRI study showed an acute right pontine  non-hemorrhagic infarction.  The MRA study showed 64% stenosis of the  proximal right internal carotid artery.  Carotid Doppler study showed  right internal carotid artery stenosis of 60%-80% and left internal  carotid stenosis of 40%-60%.  An echocardiogram showed an ejection  fraction of 75% without emboli.  Dr. Mamie Nick P. Sethi from neurology saw  the patient and recommended Aggrenox one tab q.h.s., as well as aspirin  81 mg q.d.  There is no current order to increase Aggrenox to a b.i.d.  dosing.  She was placed on subcu Lovenox for deep venous thrombosis  prophylaxis.  A trial of Sinemet was added b.i.d. at 25/100 mg for  Parkinson's disease.   The patient was evaluated by the rehabilitation physicians and felt to  be an appropriate candidate for inpatient rehabilitation.   REVIEW OF SYSTEMS:  Positive for depression, weakness and reflux.   PAST MEDICAL/SURGICAL HISTORY:  1. History of TIA with prior pontine infarction.  2. Insulin-dependent diabetes mellitus with peripheral neuropathy with      q.a.m. Accu-Chek only.  3. Hypertension.  4. Depression.  5.  Gastroesophageal reflux disease.  6. Dyslipidemia.  7. Mild Parkinson's disease.  8. Prior appendectomy.   FAMILY HISTORY:  Positive for coronary artery disease and diabetes  mellitus.   SOCIAL HISTORY:  The patient is married and lives in a one-level home  with her husband.  There are six steps to enter.  They have been married  for approximately 18 years.  Her husband is in good health and can  assist as needed.  She reports remote tobacco use and denies alcohol  usage.   FUNCTIONAL HISTORY:  Prior to admission independent, driving and going  to regular exercise classes at their local gym.   ALLERGIES:  PENICILLIN.   MEDICATIONS PRIOR TO ADMISSION:  1. Clonidine 0.3 mg, two tab b.i.d.  2. Lovastatin 20 mg q.d.  3. Aspirin 81 mg q.d.  4. Effexor XR 75 mg q.d.  5. Lantus 36 units q.d.  6. Niacin 500 mg q.d.  7. Multivitamin q.d.  8. Vitamin B12, 2000 mcg q.d.  9. Os-Cal 600 mg b.i.d.   LABORATORY DATA:  A recent hemoglobin was 11 with a hematocrit of 31.9,  platelet  count 261,000, white count 7.6.  CBGs have been in the 120-179  range.  Cholesterol total 192.  Homocysteine was measured at 9.7 and  hemoglobin A1c was elevated at 8.5.  Recent sodium 134, potassium 4.2,  chloride 97, CO2 of 29, BUN 21, creatinine 1.   PHYSICAL EXAMINATION:  GENERAL:  A well-appearing, fit adult female  lying in bed, in no acute discomfort.  VITAL SIGNS:  Blood pressure 143/61, pulse 68, respirations 18,  temperature 98.3 degrees.  HEENT:  Normocephalic and atraumatic.  CARDIOVASCULAR:  A regular rate and rhythm.  S1, S2 without murmurs.  ABDOMEN:  Soft, nontender.  Positive bowel sounds.  LUNGS:  Clear to auscultation bilaterally.  NEUROLOGIC:  Alert and oriented x3.  Cranial nerves II-XII  showed  slight facial weakness on the left but she denied abnormal sensation.  She has good eye closure bilaterally.  There is only mild dysarthria  noted with speech.  Right upper and right lower  extremity exam revealed  5/5 strength throughout.  Bulk and tone were normal.  Reflexes 2+ and  symmetrical.  Left upper extremity strength was 0-1/5 and left lower  extremity strength was 3-/5.  Sensation intact to light touch throughout  the left arm and leg.   IMPRESSION:  1. Status post acute right pontine infarction with left hemiparesis,      arm greater than leg.  2. Hypertension.  3. Insulin-dependent diabetes mellitus.  4. Dyslipidemia.  5. Parkinson's disease (new diagnosis).  6. Depression.   Presently the patient has deficits in ADLs, transfers and ambulation,  along with higher level speech, related to a right pontine infarction.   PLAN:  1,  Admit to the rehabilitation unit for daily physical therapy  for range of motion, strengthening, bed mobility, transfers, pre-gait  training, gait training and equipment evaluation.  1. Occupational therapy for range of motion, strengthening, ADLs,      cognitive/perceptual training, splinting and equipment evaluation.  2. Rehab nursing for skin care, wound care and bowel and bladder      training.  Speech therapy for oral motor exercises and dysphagia as      necessary.  3. Case management to assess the home environment and assist with      discharge planning and arrange for appropriate follow-up care.  4. Social Worker to assess the family and social support.  Counsel the      patient and the family regarding disability issues and assist in      discharge planning.  5. Check admission labs including CBC and CMET on Thursday, July 18, 2006.  6. Subcu Lovenox 40 mg q.d. for deep venous thrombosis prophylaxis.  7. Continue Aggrenox one p.o. q.h.s.  8. Continue aspirin 81 mg q.d.  9. Clonidine 0.6 mg p.o. b.i.d., along with Lisinopril 10 mg p.o. q.d.      for hypertension.  10.Monitor CBGs q.a.c. and q.h.s., on Lantus insulin 36 units subcu      q.h.s. and Glucophage 500 mg p.o. b.i.d. 11.Continue Sinemet 205/100, 1/2 tab  p.o. q.d. at 0800 hours and 1700      hours daily.  12.Routine __________ and skin breakdown.  13.Effexor 75 mg p.o. q.d.  14.Zocor 20 mg p.o. q.d.  15.Niacin 500 mg p.o. q.h.s. for dyslipidemia.  16.Multivitamin q.d.  17.Tri-Chlor 48 mg p.o. q.d.   PROGNOSIS:  Good.   ESTIMATED LENGTH OF STAY:  Is 15 to 21 days.   DISPOSITION/GOALS:  Minimal assist for lower  extremity ADLs and standby  assist for upper extremity ADLs.  Standby assist to modified independent  for transfers and short distance ambulation, modified independent  wheelchair level for mobility and transfers.           ______________________________  Jarvis Morgan, M.D.     DC/MEDQ  D:  07/17/2006  T:  07/17/2006  Job:  [id number]

## 2011-01-08 ENCOUNTER — Ambulatory Visit
Admission: RE | Admit: 2011-01-08 | Discharge: 2011-01-08 | Disposition: A | Discharge status: Home / Self Care | Payer: Medicare Other | Source: Ambulatory Visit | Attending: Radiation Oncology | Admitting: Radiation Oncology

## 2011-02-21 ENCOUNTER — Other Ambulatory Visit: Payer: Self-pay | Admitting: Gynecology

## 2011-02-21 ENCOUNTER — Other Ambulatory Visit (HOSPITAL_COMMUNITY)
Admission: RE | Admit: 2011-02-21 | Discharge: 2011-02-21 | Disposition: A | Discharge status: Home / Self Care | Payer: Medicare Other | Source: Ambulatory Visit | Attending: Gynecology | Admitting: Gynecology

## 2011-02-21 ENCOUNTER — Ambulatory Visit: Payer: Medicare Other | Attending: Gynecology | Admitting: Gynecology

## 2011-02-21 DIAGNOSIS — G20A1 Parkinson's disease without dyskinesia, without mention of fluctuations: Secondary | ICD-10-CM | POA: Insufficient documentation

## 2011-02-21 DIAGNOSIS — E1142 Type 2 diabetes mellitus with diabetic polyneuropathy: Secondary | ICD-10-CM | POA: Insufficient documentation

## 2011-02-21 DIAGNOSIS — Z794 Long term (current) use of insulin: Secondary | ICD-10-CM | POA: Insufficient documentation

## 2011-02-21 DIAGNOSIS — Z86718 Personal history of other venous thrombosis and embolism: Secondary | ICD-10-CM | POA: Insufficient documentation

## 2011-02-21 DIAGNOSIS — Z9089 Acquired absence of other organs: Secondary | ICD-10-CM | POA: Insufficient documentation

## 2011-02-21 DIAGNOSIS — G20 Parkinson's disease: Secondary | ICD-10-CM | POA: Insufficient documentation

## 2011-02-21 DIAGNOSIS — I10 Essential (primary) hypertension: Secondary | ICD-10-CM | POA: Insufficient documentation

## 2011-02-21 DIAGNOSIS — Z90710 Acquired absence of both cervix and uterus: Secondary | ICD-10-CM | POA: Insufficient documentation

## 2011-02-21 DIAGNOSIS — E1149 Type 2 diabetes mellitus with other diabetic neurological complication: Secondary | ICD-10-CM | POA: Insufficient documentation

## 2011-02-21 DIAGNOSIS — Z8540 Personal history of malignant neoplasm of unspecified female genital organ: Secondary | ICD-10-CM | POA: Insufficient documentation

## 2011-02-21 DIAGNOSIS — C549 Malignant neoplasm of corpus uteri, unspecified: Secondary | ICD-10-CM | POA: Insufficient documentation

## 2011-02-21 DIAGNOSIS — E785 Hyperlipidemia, unspecified: Secondary | ICD-10-CM | POA: Insufficient documentation

## 2011-02-21 DIAGNOSIS — Z79899 Other long term (current) drug therapy: Secondary | ICD-10-CM | POA: Insufficient documentation

## 2011-02-23 NOTE — Consult Note (Signed)
[name], OCONNER NO.:  [id number]  MEDICAL RECORD NO.:  [id number]  LOCATION:  GYN                          FACILITY:  [facility]  PHYSICIAN:  Marti Sleigh, M.D.DATE OF BIRTH:  [birth year]  DATE OF CONSULTATION: DATE OF DISCHARGE:                                CONSULTATION   CHIEF COMPLAINT:  Endometrial cancer.  INTERVAL HISTORY:  The patient returns today as previously scheduled, having completed external beam radiation therapy and intracavitary brachytherapy under the direction of Dr. Gery Pray.  All therapy was completed in early July.  Since then the patient tells me she has recovered fully and is back to full levels of activity.  She has no GI or GU symptoms.  No vaginal bleeding, pelvic pain, or other gynecologic problems.  HISTORY OF PRESENT ILLNESS:  The patient underwent a total abdominal hysterectomy, bilateral salpingo-oophorectomy on July 04, 2010. Final pathology showed a grade 3 endometrial cancer with invasion of greater than 50% of the myometrium.  Because of the patient's age and physical disability, we did not do node dissection.  (Stage IB grade 3), she received postoperative whole pelvis radiation therapy and brachytherapy.  PAST MEDICAL HISTORY: 1. Hypertension. 2. Diabetes. 3. History of Parkinson disease. 4. Cerebral vascular accident with right pontine infarct in 2008 and     2002. 5. Hyperlipidemia. 6. Depression. 7. History of deep vein thrombosis in the distant past. 8. Peripheral neuropathy secondary to diabetes.  PAST SURGICAL HISTORY:  Appendectomy, benign breast cyst removal, and total abdominal hysterectomy, and bilateral salpingo-oophorectomy.  OBSTETRICAL HISTORY:  Gravida 2.  CURRENT MEDICATIONS:  Lovastatin, bisoprolol/HCTZ, clonidine, metformin, carbidopa/levodopa, lisinopril, Lantus, Xanax, and Prozac.  FAMILY HISTORY:  Father with gastric cancer.  DRUG ALLERGIES:  PENICILLIN  (rash).  REVIEW OF SYSTEMS:  10-point comprehensive review of systems negative except as noted above.  PHYSICAL EXAM:  VITAL SIGNS:  Blood pressure 120/60 and pulse 68.  The patient is unable to stand to be weighed. GENERAL:  The patient is a pleasant white female, in no acute distress. HEENT:  Negative. NECK:  Supple without thyromegaly.  There is no supraclavicular or inguinal adenopathy. ABDOMEN:  Soft, nontender.  No mass, organomegaly, ascites, or hernias noted.  Pfannenstiel incision is well-healed. PELVIC EXAM:  EGBUS.  Vagina, bladder, and urethra are normal.  Cervix and uterus are surgically absent.  Adnexa without masses. RECTOVAGINAL EXAM:  Confirms. EXTREMITIES:  Lower extremities without edema or varicosities.  She does have significant paralysis in the left lower extremity.  IMPRESSION:  Grade 3 stage IB endometrial carcinoma status post hysterectomy, bilateral salpingo-oophorectomy, and pelvic radiation therapy.  The patient is clinically free of disease.  Pap smears were obtained.  She will return to see Dr. Sondra Come in 3 months, return to see [place] in 6 months.     Marti Sleigh, M.D.     DC/MEDQ  D:  02/21/2011  T:  02/21/2011  Job:  [id number]  cc:   Selinda Orion, M.D. Fax: [phone]  Blair Promise, Ph.D., M.D. Fax: [phone]  Caswell Corwin, R.N. [address] [place], [place] [address]  Tamsen Roers, MD Fax: [phone]  Electronically Signed by Marti Sleigh M.D. on 02/23/2011  11:13:38 AM

## 2011-05-21 ENCOUNTER — Ambulatory Visit: Payer: Medicare Other | Admitting: Radiation Oncology

## 2011-06-11 ENCOUNTER — Ambulatory Visit
Admission: RE | Admit: 2011-06-11 | Discharge: 2011-06-11 | Disposition: A | Discharge status: Home / Self Care | Payer: Medicare Other | Source: Ambulatory Visit | Attending: Radiation Oncology | Admitting: Radiation Oncology

## 2011-06-11 ENCOUNTER — Other Ambulatory Visit (HOSPITAL_COMMUNITY)
Admission: RE | Admit: 2011-06-11 | Discharge: 2011-06-11 | Disposition: A | Discharge status: Home / Self Care | Payer: Medicare Other | Source: Ambulatory Visit | Attending: Radiation Oncology | Admitting: Radiation Oncology

## 2011-06-11 DIAGNOSIS — Z124 Encounter for screening for malignant neoplasm of cervix: Secondary | ICD-10-CM | POA: Insufficient documentation

## 2011-06-11 DIAGNOSIS — C549 Malignant neoplasm of corpus uteri, unspecified: Secondary | ICD-10-CM | POA: Clinically undetermined

## 2011-06-11 NOTE — Progress Notes (Signed)
CC:   Marti Sleigh, M.D. Tamsen Roers, MD Selinda Orion, M.D.  DIAGNOSIS:  High-grade endometrioid adenocarcinoma.  INTERVAL SINCE RADIATION THERAPY:  6 months.  NARRATIVE:  Ms. Broge comes in today for routine followup.  She is accompanied by her husband on evaluation today.  The patient denies any new problems.  She denies any vaginal bleeding, pelvic pain, or flank pain.  The patient has difficulty using her vaginal dilator in light of her stroke.  PHYSICAL EXAMINATION:  Vital Signs:  The patient's temperature is 98.0, pulse is 54, blood pressure is 152/60, weight is 116.5 pounds.  Lymph: Examination of the neck and supraclavicular region reveals no evidence for adenopathy.  Axillary areas are free of adenopathy.  Lungs: Examination of the lungs reveals them to be clear.  Heart:  Has a regular rhythm and rate.  Abdomen:  Examination of the abdomen reveals it to be soft and nontender with normal bowel sounds.  There is no obvious hepatosplenomegaly.  The inguinal areas are free of adenopathy. Pelvic Examination:  There are some radiation changes noted in the proximal vagina.  A Pap smear is obtained of the proximal vagina.  There are no lesions noted along the vulvovaginal area.  On bimanual and rectovaginal examination, there are no pelvic masses appreciated.  IMPRESSION AND PLAN:  Clinically no evident disease, Pap smear pending. The patient will return for followup in 6 months and in the interim will be seen in GYN Oncology.    ______________________________ Blair Promise, Ph.D., M.D. JDK/MEDQ  D:  06/11/2011  T:  06/11/2011  Job:  [id number]

## 2011-06-11 NOTE — Progress Notes (Signed)
Here for routine follow up radiation for endometrial ca. No burning or frequency of urination.

## 2011-06-14 ENCOUNTER — Telehealth: Payer: Self-pay

## 2011-06-14 NOTE — Telephone Encounter (Signed)
Pt. Called and  informed of pap smear results from 06/11/11 negative for intraepithelial lesions/malignancy.

## 2011-09-19 ENCOUNTER — Emergency Department (HOSPITAL_COMMUNITY): Payer: Medicare Other

## 2011-09-19 ENCOUNTER — Emergency Department (HOSPITAL_COMMUNITY)
Admission: EM | Admit: 2011-09-19 | Discharge: 2011-09-19 | Disposition: A | Discharge status: Home / Self Care | Payer: Medicare Other | Attending: Emergency Medicine | Admitting: Emergency Medicine

## 2011-09-19 ENCOUNTER — Encounter (HOSPITAL_COMMUNITY): Payer: Self-pay | Admitting: Emergency Medicine

## 2011-09-19 DIAGNOSIS — M25519 Pain in unspecified shoulder: Secondary | ICD-10-CM | POA: Insufficient documentation

## 2011-09-19 DIAGNOSIS — R350 Frequency of micturition: Secondary | ICD-10-CM | POA: Insufficient documentation

## 2011-09-19 DIAGNOSIS — R631 Polydipsia: Secondary | ICD-10-CM | POA: Insufficient documentation

## 2011-09-19 DIAGNOSIS — W19XXXA Unspecified fall, initial encounter: Secondary | ICD-10-CM | POA: Insufficient documentation

## 2011-09-19 DIAGNOSIS — E119 Type 2 diabetes mellitus without complications: Secondary | ICD-10-CM | POA: Insufficient documentation

## 2011-09-19 DIAGNOSIS — Z8701 Personal history of pneumonia (recurrent): Secondary | ICD-10-CM | POA: Insufficient documentation

## 2011-09-19 DIAGNOSIS — Z859 Personal history of malignant neoplasm, unspecified: Secondary | ICD-10-CM | POA: Insufficient documentation

## 2011-09-19 DIAGNOSIS — M545 Low back pain, unspecified: Secondary | ICD-10-CM | POA: Insufficient documentation

## 2011-09-19 DIAGNOSIS — Z794 Long term (current) use of insulin: Secondary | ICD-10-CM | POA: Insufficient documentation

## 2011-09-19 DIAGNOSIS — M109 Gout, unspecified: Secondary | ICD-10-CM | POA: Insufficient documentation

## 2011-09-19 DIAGNOSIS — Z8673 Personal history of transient ischemic attack (TIA), and cerebral infarction without residual deficits: Secondary | ICD-10-CM | POA: Insufficient documentation

## 2011-09-19 DIAGNOSIS — M549 Dorsalgia, unspecified: Secondary | ICD-10-CM

## 2011-09-19 DIAGNOSIS — I10 Essential (primary) hypertension: Secondary | ICD-10-CM | POA: Insufficient documentation

## 2011-09-19 HISTORY — DX: Malignant (primary) neoplasm, unspecified: C80.1

## 2011-09-19 HISTORY — DX: Pneumonia, unspecified organism: J18.9

## 2011-09-19 HISTORY — DX: Essential (primary) hypertension: I10

## 2011-09-19 HISTORY — DX: Cerebral infarction, unspecified: I63.9

## 2011-09-19 LAB — DIFFERENTIAL
Basophils Relative: 0 % (ref 0–1)
Lymphocytes Relative: 22 % (ref 12–46)
Monocytes Absolute: 0.5 10*3/uL (ref 0.1–1.0)
Monocytes Relative: 10 % (ref 3–12)
Neutro Abs: 3 10*3/uL (ref 1.7–7.7)

## 2011-09-19 LAB — BASIC METABOLIC PANEL
BUN: 21 mg/dL (ref 6–23)
CO2: 28 mEq/L (ref 19–32)
Chloride: 99 mEq/L (ref 96–112)
Creatinine, Ser: 0.77 mg/dL (ref 0.50–1.10)
Glucose, Bld: 69 mg/dL — ABNORMAL LOW (ref 70–99)

## 2011-09-19 LAB — CBC
HCT: 33.5 % — ABNORMAL LOW (ref 36.0–46.0)
Hemoglobin: 10.9 g/dL — ABNORMAL LOW (ref 12.0–15.0)
MCHC: 32.5 g/dL (ref 30.0–36.0)

## 2011-09-19 LAB — URINALYSIS, ROUTINE W REFLEX MICROSCOPIC
Bilirubin Urine: NEGATIVE
Glucose, UA: NEGATIVE mg/dL
Hgb urine dipstick: NEGATIVE
Ketones, ur: NEGATIVE mg/dL
pH: 7 (ref 5.0–8.0)

## 2011-09-19 LAB — GLUCOSE, CAPILLARY: Glucose-Capillary: 51 mg/dL — ABNORMAL LOW (ref 70–99)

## 2011-09-19 MED ORDER — MORPHINE SULFATE 2 MG/ML IJ SOLN
2.0000 mg | Freq: Once | INTRAMUSCULAR | Status: AC
  Administered 2011-09-19: 2 mg via INTRAVENOUS
  Filled 2011-09-19: qty 1

## 2011-09-19 MED ORDER — TRAMADOL HCL 50 MG PO TABS: 50.0000 mg | ORAL_TABLET | Freq: Four times a day (QID) | ORAL | Status: AC | PRN

## 2011-09-19 MED ORDER — SODIUM CHLORIDE 0.9 % IV SOLN
INTRAVENOUS | Status: DC
  Administered 2011-09-19: 125 mL via INTRAVENOUS

## 2011-09-19 NOTE — Discharge Instructions (Signed)
Your x-rays do not show any broken, or dislocated bones.  Your blood tests, and urine tests do not show any significant illness.  Use tramadol for pain.  Followup with your Dr. for reevaluation.  Return for worse or uncontrolled symptoms

## 2011-09-19 NOTE — ED Notes (Signed)
Pt given oj per dr order for blood sugar

## 2011-09-19 NOTE — ED Notes (Addendum)
Pt reports back pain x 6 weeks. In the last few months pt has been falling increasingly more and unable to use her walker. Pt husband is now unable to care for pt and is not able to pick her up off the floor. Husband is the only caretaker.   Pt reports pain along spine from shoulders to tailbone, 5/10. Left shoulder hurts. Pt had stroke 5 years ago, but now completely unable to use left side.   Also reports some nausea/vomitting/ diarrhea for past few days.

## 2011-09-19 NOTE — ED Provider Notes (Signed)
History     CSN: [id number]  Arrival date & time 09/19/11  1331   First MD Initiated Contact with Patient 09/19/11 1548      Chief Complaint  Patient presents with  . Back Pain  . Tailbone Pain    (Consider location/radiation/quality/duration/timing/severity/associated sxs/prior treatment) Patient is a 78 y.o. female presenting with back pain. The history is provided by the patient and the spouse.  Back Pain  Pertinent negatives include no chest pain, no fever, no headaches, no abdominal pain and no dysuria.   the patient is an 78 year old, female, with a history of stroke, and persistent left-sided weakness, who presents to emergency department with lower back pain, and left shoulder.  Pain after a fall 2 days ago.  She also has had polydipsia.  She denies nausea, vomiting, diarrhea, cough, or shortness of breath.  Her husband states that she has been falling down more frequently than usual.  Past Medical History  Diagnosis Date  . Stroke   . Cancer   . Diabetes mellitus   . Hypertension   . Gout   . Pneumonia     Past Surgical History  Procedure Date  . Abdominal hysterectomy     History reviewed. No pertinent family history.  History  Substance Use Topics  . Smoking status: Not on file  . Smokeless tobacco: Not on file  . Alcohol Use: No    OB History    Grav Para Term Preterm Abortions TAB SAB Ect Mult Living                  Review of Systems  Constitutional: Negative for fever and chills.  Respiratory: Negative for cough and shortness of breath.   Cardiovascular: Negative for chest pain.  Gastrointestinal: Negative for nausea, vomiting, abdominal pain and diarrhea.  Genitourinary: Positive for frequency. Negative for dysuria.  Musculoskeletal: Positive for back pain.  Skin: Negative for wound.  Neurological: Negative for headaches.  Psychiatric/Behavioral: Negative for confusion.  All other systems reviewed and are negative.    Allergies    Penicillins  Home Medications   Current Outpatient Rx  Name Route Sig Dispense Refill  . ACETAMINOPHEN 500 MG PO TABS Oral Take 1,000 mg by mouth every 6 (six) hours as needed. For pain.    Marland Kitchen ALPRAZOLAM 0.25 MG PO TABS Oral Take 0.25 mg by mouth at bedtime as needed.    Marland Kitchen BISOPROLOL FUMARATE 10 MG PO TABS Oral Take 10 mg by mouth daily.    Marland Kitchen CARBIDOPA-LEVODOPA 25-100 MG PO TABS Oral Take 1 tablet by mouth 2 (two) times daily.    Marland Kitchen VITAMIN D 2000 UNITS PO CAPS Oral Take 1 capsule by mouth daily.    Marland Kitchen CLONIDINE HCL 0.3 MG PO TABS Oral Take 0.3 mg by mouth 2 (two) times daily.    Marland Kitchen FLUOXETINE HCL 20 MG PO CAPS Oral Take 20 mg by mouth daily.    . INSULIN GLARGINE 100 UNIT/ML [place] SOLN Subcutaneous Inject 38 Units into the skin daily.    Marland Kitchen LISINOPRIL 20 MG PO TABS Oral Take 20 mg by mouth daily.    Marland Kitchen LOVASTATIN 20 MG PO TABS Oral Take 20 mg by mouth at bedtime.    Marland Kitchen METFORMIN HCL 500 MG PO TABS Oral Take 500 mg by mouth 2 (two) times daily with a meal.    . POLYETHYLENE GLYCOL 3350 PO PACK Oral Take 17 g by mouth daily.      BP 110/58  Pulse 57  Temp(Src) 97.8 F (36.6 C) (Oral)  Resp 16  SpO2 100%  Physical Exam  Vitals reviewed. Constitutional: She is oriented to person, place, and time. She appears well-developed and well-nourished.  HENT:  Head: Normocephalic.  Eyes: Conjunctivae are normal.  Neck: Normal range of motion. Neck supple.  Cardiovascular: Normal rate.   No murmur heard. Pulmonary/Chest: Effort normal. No respiratory distress.  Abdominal: Soft. There is no tenderness.  Musculoskeletal:       Left shoulder tenderness, with no deformity Lumbar and sacral tenderness, with no step-offs No cervical or thoracic tenderness  Neurological: She is alert and oriented to person, place, and time.  Skin: Skin is warm and dry.  Psychiatric: She has a normal mood and affect. Thought content normal.    ED Course  Procedures (including critical care time)  Labs Reviewed   GLUCOSE, CAPILLARY - Abnormal; Notable for the following:    Glucose-Capillary 51 (*)    All other components within normal limits   No results found.   No diagnosis found.    MDM  Back pain No fx or dislocation        Barbara Cower, MD 09/19/11 1921

## 2011-09-19 NOTE — ED Notes (Signed)
md at bedside  Pt alert and oriented x4. Respirations even and unlabored, bilateral symmetrical rise and fall of chest. Skin warm and dry. In no acute distress. Denies needs.   

## 2011-09-19 NOTE — ED Notes (Signed)
C/o back pain from tailbone up to midback. Has hx of CVA with left sided paralysis, states has been falling more in "past little while" - last fell 2 days ago-

## 2011-09-23 ENCOUNTER — Encounter (HOSPITAL_COMMUNITY): Payer: Self-pay | Admitting: *Deleted

## 2011-09-23 ENCOUNTER — Emergency Department (HOSPITAL_COMMUNITY): Payer: Medicare Other

## 2011-09-23 ENCOUNTER — Emergency Department (HOSPITAL_COMMUNITY)
Admission: EM | Admit: 2011-09-23 | Discharge: 2011-09-24 | Disposition: A | Discharge status: Home / Self Care | Payer: Medicare Other | Attending: Emergency Medicine | Admitting: Emergency Medicine

## 2011-09-23 DIAGNOSIS — E860 Dehydration: Secondary | ICD-10-CM

## 2011-09-23 DIAGNOSIS — R5383 Other fatigue: Secondary | ICD-10-CM | POA: Insufficient documentation

## 2011-09-23 DIAGNOSIS — I959 Hypotension, unspecified: Secondary | ICD-10-CM | POA: Insufficient documentation

## 2011-09-23 DIAGNOSIS — R0602 Shortness of breath: Secondary | ICD-10-CM | POA: Insufficient documentation

## 2011-09-23 DIAGNOSIS — G319 Degenerative disease of nervous system, unspecified: Secondary | ICD-10-CM | POA: Insufficient documentation

## 2011-09-23 DIAGNOSIS — Z794 Long term (current) use of insulin: Secondary | ICD-10-CM | POA: Insufficient documentation

## 2011-09-23 DIAGNOSIS — E119 Type 2 diabetes mellitus without complications: Secondary | ICD-10-CM | POA: Insufficient documentation

## 2011-09-23 DIAGNOSIS — R5381 Other malaise: Secondary | ICD-10-CM | POA: Insufficient documentation

## 2011-09-23 DIAGNOSIS — R4789 Other speech disturbances: Secondary | ICD-10-CM | POA: Insufficient documentation

## 2011-09-23 DIAGNOSIS — Z8701 Personal history of pneumonia (recurrent): Secondary | ICD-10-CM | POA: Insufficient documentation

## 2011-09-23 DIAGNOSIS — Z8673 Personal history of transient ischemic attack (TIA), and cerebral infarction without residual deficits: Secondary | ICD-10-CM | POA: Insufficient documentation

## 2011-09-23 DIAGNOSIS — I10 Essential (primary) hypertension: Secondary | ICD-10-CM | POA: Insufficient documentation

## 2011-09-23 LAB — CBC
HCT: 33.7 % — ABNORMAL LOW (ref 36.0–46.0)
Hemoglobin: 10.8 g/dL — ABNORMAL LOW (ref 12.0–15.0)
MCH: 31 pg (ref 26.0–34.0)
MCHC: 32 g/dL (ref 30.0–36.0)

## 2011-09-23 LAB — URINALYSIS, ROUTINE W REFLEX MICROSCOPIC
Glucose, UA: NEGATIVE mg/dL
Leukocytes, UA: NEGATIVE
Nitrite: NEGATIVE
Protein, ur: 30 mg/dL — AB
Urobilinogen, UA: 0.2 mg/dL (ref 0.0–1.0)

## 2011-09-23 LAB — CARDIAC PANEL(CRET KIN+CKTOT+MB+TROPI)
Relative Index: INVALID (ref 0.0–2.5)
Total CK: 40 U/L (ref 7–177)

## 2011-09-23 LAB — DIFFERENTIAL
Basophils Relative: 0 % (ref 0–1)
Eosinophils Absolute: 0.1 10*3/uL (ref 0.0–0.7)
Lymphs Abs: 0.9 10*3/uL (ref 0.7–4.0)
Monocytes Absolute: 0.4 10*3/uL (ref 0.1–1.0)
Monocytes Relative: 5 % (ref 3–12)

## 2011-09-23 LAB — COMPREHENSIVE METABOLIC PANEL
Albumin: 3.4 g/dL — ABNORMAL LOW (ref 3.5–5.2)
BUN: 25 mg/dL — ABNORMAL HIGH (ref 6–23)
Chloride: 102 mEq/L (ref 96–112)
Creatinine, Ser: 1.53 mg/dL — ABNORMAL HIGH (ref 0.50–1.10)
GFR calc Af Amer: 36 mL/min — ABNORMAL LOW (ref >90)
Glucose, Bld: 98 mg/dL (ref 70–99)
Total Bilirubin: 0.4 mg/dL (ref 0.3–1.2)
Total Protein: 6.4 g/dL (ref 6.0–8.3)

## 2011-09-23 LAB — URINE MICROSCOPIC-ADD ON

## 2011-09-23 MED ORDER — SODIUM CHLORIDE 0.9 % IV BOLUS (SEPSIS)
500.0000 mL | Freq: Once | INTRAVENOUS | Status: AC
  Administered 2011-09-23: 500 mL via INTRAVENOUS

## 2011-09-23 MED ORDER — SODIUM CHLORIDE 0.9 % IV SOLN
INTRAVENOUS | Status: DC
  Administered 2011-09-23: 125 mL/h via INTRAVENOUS

## 2011-09-23 NOTE — ED Notes (Signed)
Husband states wife is back to her normal self.  Pt requesting water to drink and requesting to go home.  Dr Lacinda Axon notified.  Pt given water and tolerating well.

## 2011-09-23 NOTE — ED Notes (Signed)
Decreased lethargy and slurred speech after 500 bolus.

## 2011-09-23 NOTE — ED Notes (Signed)
Patient transported to CT 

## 2011-09-23 NOTE — ED Notes (Signed)
Family called EMS B/C pt was acting weak and lethargic and slurring her speech more than normal (noticed changes at 2300 last night).  CBG of 171.  NSR on monitor.  Pt was hypotensive 90/52 with improvement to 130 sbp after 500 ml bolus ns.  Pt alert to self.

## 2011-09-23 NOTE — ED Notes (Signed)
Pt able to answer all loc questions except for the year.  States she continue to feel better.

## 2011-09-23 NOTE — ED Provider Notes (Signed)
History     CSN: [id number]  Arrival date & time 09/23/11  [id number]   First MD Initiated Contact with Patient 09/23/11 1952      Chief Complaint  Patient presents with  . Fatigue  . Altered Mental Status    sine 2300 09/22/11    (Consider location/radiation/quality/duration/timing/severity/associated sxs/prior treatment) HPI.... level V caveat for urgent need for intervention.  Complains of weakness and lethargy. Husband reports patient has stroke a couple years ago which left her with left-sided weakness.  she can walk with a walker at home. Decreased oral intake today.  No fever, sweats, chills, stiff neck, cough, dysuria.  Symptoms started last evening. Patient was initially hypotensive at 90/52 blood pressure improved after IV fluid bolus .  Nothing makes symptoms better or worse  Past Medical History  Diagnosis Date  . Cancer   . Diabetes mellitus   . Hypertension   . Gout   . Pneumonia   . Stroke     L sided deficits w/slurred speech    Past Surgical History  Procedure Date  . Abdominal hysterectomy     No family history on file.  History  Substance Use Topics  . Smoking status: Not on file  . Smokeless tobacco: Not on file  . Alcohol Use: No    OB History    Grav Para Term Preterm Abortions TAB SAB Ect Mult Living                  Review of Systems  Unable to perform ROS: Other    Allergies  Penicillins  Home Medications   Current Outpatient Rx  Name Route Sig Dispense Refill  . ACETAMINOPHEN 500 MG PO TABS Oral Take 1,000 mg by mouth every 6 (six) hours as needed. For pain.    Marland Kitchen ALPRAZOLAM 0.25 MG PO TABS Oral Take 0.25 mg by mouth at bedtime as needed.    Marland Kitchen BISOPROLOL FUMARATE 10 MG PO TABS Oral Take 10 mg by mouth daily.    Marland Kitchen CARBIDOPA-LEVODOPA 25-100 MG PO TABS Oral Take 1 tablet by mouth 2 (two) times daily.    Marland Kitchen VITAMIN D 2000 UNITS PO CAPS Oral Take 1 capsule by mouth daily.    Marland Kitchen CLONIDINE HCL 0.3 MG PO TABS Oral Take 0.3 mg by mouth 2 (two)  times daily.    Marland Kitchen FLUOXETINE HCL 20 MG PO CAPS Oral Take 20 mg by mouth daily.    . INSULIN GLARGINE 100 UNIT/ML  SOLN Subcutaneous Inject 38 Units into the skin daily.    Marland Kitchen LISINOPRIL 20 MG PO TABS Oral Take 20 mg by mouth daily.    Marland Kitchen LOVASTATIN 20 MG PO TABS Oral Take 20 mg by mouth at bedtime.    Marland Kitchen METFORMIN HCL 500 MG PO TABS Oral Take 500 mg by mouth 2 (two) times daily with a meal.    . POLYETHYLENE GLYCOL 3350 PO PACK Oral Take 17 g by mouth daily.    . TRAMADOL HCL 50 MG PO TABS Oral Take 1 tablet (50 mg total) by mouth every 6 (six) hours as needed for pain. 15 tablet 0    BP 113/50  Pulse 64  Temp(Src) 98 F (36.7 C) (Oral)  Resp 16  SpO2 100%  Physical Exam  Nursing note and vitals reviewed. Constitutional: She is oriented to person, place, and time.       Slightly hypotensive, pale, lethargic, answers questions but slowly.  Appears parched with dry mucous membranes  HENT:  Head: Normocephalic and atraumatic.  Eyes: Conjunctivae and EOM are normal. Pupils are equal, round, and reactive to light.  Neck: Normal range of motion. Neck supple.  Cardiovascular: Normal rate and regular rhythm.   Pulmonary/Chest: Effort normal and breath sounds normal.  Abdominal: Soft. Bowel sounds are normal.  Musculoskeletal: Normal range of motion.  Neurological: She is oriented to person, place, and time.       Alert and oriented x2 (thought it was Saturday).. sluggish movement on left side but this is old.  Skin: Skin is warm and dry.  Psychiatric: She has a normal mood and affect.    ED Course  Procedures (including critical care time)  Labs Reviewed  CBC - Abnormal; Notable for the following:    RBC 3.48 (*)    Hemoglobin 10.8 (*)    HCT 33.7 (*)    All other components within normal limits  DIFFERENTIAL - Abnormal; Notable for the following:    Neutrophils Relative 85 (*)    Lymphocytes Relative 10 (*)    All other components within normal limits  COMPREHENSIVE METABOLIC  PANEL - Abnormal; Notable for the following:    BUN 25 (*)    Creatinine, Ser 1.53 (*)    Albumin 3.4 (*)    GFR calc non Af Amer 31 (*)    GFR calc Af Amer 36 (*)    All other components within normal limits  URINALYSIS, ROUTINE W REFLEX MICROSCOPIC - Abnormal; Notable for the following:    Protein, ur 30 (*)    All other components within normal limits  URINE MICROSCOPIC-ADD ON - Abnormal; Notable for the following:    Bacteria, UA FEW (*)    Casts HYALINE CASTS (*)    All other components within normal limits  CARDIAC PANEL(CRET KIN+CKTOT+MB+TROPI)    Ct Head Wo Contrast  09/23/2011  *RADIOLOGY REPORT*  Clinical Data: Previous left sided stroke.  Generalized profound weakness.Slurred speech.  CT HEAD WITHOUT CONTRAST  Technique:  Contiguous axial images were obtained from the base of the skull through the vertex without contrast.  Comparison: 08/30/2010 MRI.  Findings: No mass lesion, mass effect, midline shift, hydrocephalus, hemorrhage.  No acute territorial cortical ischemia/infarct. Atrophy and chronic ischemic white matter disease is present.  Scattered ethmoid sinus disease.  Complete opacification of the left maxillary sinus with calcifications, compatible with chronic sinusitis.  No interval change compared to the prior exam of 08/29/2010.  Calcifications do extend medially beyond the maxillary sinus wall.  This may represent mucocele. This is consistent with an indolent process.  Probable mucous retention cyst or polyp within the right maxillary floor.  IMPRESSION:  1.  Atrophy and chronic ischemic white matter disease without acute intracranial abnormality. 2.  Chronic paranasal sinus disease.  Original Report Authenticated By: Dereck Ligas, M.D.   Dg Chest Port 1 View  09/23/2011  *RADIOLOGY REPORT*  Clinical Data: Shortness of breath.  Pneumonia.  Hypertension. Diabetes.  PORTABLE CHEST - 1 VIEW  Comparison: 08/29/2010  Findings: Normal heart size and pulmonary vascularity.   Slightly shallow inspiration.  Emphysematous changes and scattered fibrosis in the upper lungs.  Apical pleural thickening.  No focal airspace consolidation.  No blunting of costophrenic angles.  No pneumothorax.  IMPRESSION: Emphysematous changes in the lungs.  No focal consolidation.  Original Report Authenticated By: Neale Burly, M.D.   No results found.   No diagnosis found.   Date: 09/23/2011  Rate: 64  Rhythm: normal sinus rhythm  QRS Axis: normal  Intervals: normal  ST/T Wave abnormalities: normal  Conduction Disutrbances: none  Narrative Interpretation: unremarkable     MDM  Patient feeling much better after IV fluids. Her color is improved. She is talking spontaneously. Husband reports that she is back to baseline.  He would like to go home. We'll discharge        Nat Christen, MD 09/23/11 [phone]

## 2011-09-23 NOTE — Discharge Instructions (Signed)
Increase fluids. Creatinine was 1.53. This is a measure of kidney function. This should be rechecked in a couple of weeks to make sure it has normalized.  Followup your primary care Dr. or return if worse. CT scan of head was normal

## 2011-09-24 NOTE — ED Notes (Signed)
Family stated they are able to move pt out of car and into house with no problem.

## 2011-12-12 ENCOUNTER — Other Ambulatory Visit (HOSPITAL_COMMUNITY)
Admission: RE | Admit: 2011-12-12 | Discharge: 2011-12-12 | Disposition: A | Discharge status: Home / Self Care | Payer: Medicare Other | Source: Ambulatory Visit | Attending: Radiation Oncology | Admitting: Radiation Oncology

## 2011-12-12 ENCOUNTER — Encounter: Payer: Self-pay | Admitting: Radiation Oncology

## 2011-12-12 ENCOUNTER — Ambulatory Visit
Admission: RE | Admit: 2011-12-12 | Discharge: 2011-12-12 | Disposition: A | Discharge status: Home / Self Care | Payer: Medicare Other | Source: Ambulatory Visit | Attending: Radiation Oncology | Admitting: Radiation Oncology

## 2011-12-12 VITALS — BP 161/72 | HR 56 | Temp 98.4°F | Resp 20 | Wt 115.0 lb

## 2011-12-12 DIAGNOSIS — C549 Malignant neoplasm of corpus uteri, unspecified: Secondary | ICD-10-CM

## 2011-12-12 DIAGNOSIS — C801 Malignant (primary) neoplasm, unspecified: Secondary | ICD-10-CM | POA: Insufficient documentation

## 2011-12-12 DIAGNOSIS — Z124 Encounter for screening for malignant neoplasm of cervix: Secondary | ICD-10-CM | POA: Insufficient documentation

## 2011-12-12 HISTORY — DX: Repeated falls: R29.6

## 2011-12-12 NOTE — Addendum Note (Signed)
Encounter addended by: Arlyss Repress, RN on: 12/12/2011  3:53 PM<BR>     Documentation filed: Orders

## 2011-12-12 NOTE — Addendum Note (Signed)
Encounter addended by: Blair Promise, MD on: 12/12/2011  8:17 PM<BR>     Documentation filed: Orders

## 2011-12-12 NOTE — Progress Notes (Addendum)
Pt denies pain, vaginal discharge, urinary/bowel issues, fatigue, loss of appetite.  Pt states she has not seen Dr Fermin Schwab this year. Pt has recent hx of falling although she uses a walker to ambulate. Continues to live at [facility].

## 2011-12-12 NOTE — Progress Notes (Signed)
Radiation Oncology         [phone] ________________________________  Name: [name] MRN: [id number]  Date: 12/12/2011  DOB: 06/27/1928  Follow-Up Visit Note  CC: Tamsen Roers, MD  Marti Sleigh *  Diagnosis:   Stage IB high-grade endometrioid adenocarcinoma  Interval Since Last Radiation:  12 months  Narrative:  The patient returns today for routine follow-up.  She completed external beam and intracavitary brachytherapy treatments on 11/14/2010. She seems to be doing reasonably well. She is bothered by hot flashes.  She denies any pain in the pelvis area urination or bowel complaints. She denies any vaginal bleeding. Given her stroke she does not use her vaginal dilator as recommended.                            ALLERGIES:  is allergic to penicillins.  Meds: Current Outpatient Prescriptions  Medication Sig Dispense Refill  . acetaminophen (TYLENOL) 500 MG tablet Take 1,000 mg by mouth every 6 (six) hours as needed. For pain.      Marland Kitchen ALPRAZolam (XANAX) 0.25 MG tablet Take 0.25 mg by mouth at bedtime as needed.      . bisoprolol (ZEBETA) 10 MG tablet Take 10 mg by mouth daily.      . carbidopa-levodopa (SINEMET IR) 25-100 MG per tablet Take 1 tablet by mouth 2 (two) times daily.      . Cholecalciferol (VITAMIN D) 2000 UNITS CAPS Take 1 capsule by mouth daily.      . cloNIDine (CATAPRES) 0.3 MG tablet Take 0.3 mg by mouth 2 (two) times daily.      Marland Kitchen FLUoxetine (PROZAC) 20 MG capsule Take 20 mg by mouth daily.      . insulin glargine (LANTUS) 100 UNIT/ML injection Inject 38 Units into the skin daily.      Marland Kitchen lisinopril (PRINIVIL,ZESTRIL) 20 MG tablet Take 20 mg by mouth daily.      Marland Kitchen lovastatin (MEVACOR) 20 MG tablet Take 20 mg by mouth at bedtime.      . metFORMIN (GLUCOPHAGE) 500 MG tablet Take 500 mg by mouth 2 (two) times daily with a meal.      . polyethylene glycol (MIRALAX / GLYCOLAX) packet Take 17 g by mouth daily.        Physical Findings: The patient is  in no acute distress. Patient is alert and oriented.  weight is 115 lb (52.164 kg). Her oral temperature is 98.4 F (36.9 C). Her blood pressure is 161/72 and her pulse is 56. Her respiration is 20. Marland Kitchen  No palpable cervical subclavicular or axillary adenopathy. The lungs are clear to auscultation. The heart has a regular rhythm and rate. The abdomen is soft and nontender with normal bowel sounds.  The inguinal areas are free of adenopathy on pelvic examination the external genitalia are unremarkable. A speculum exam is performed. Only a small speculum could be used for this procedure. A Pap smear is obtained of the proximal vagina. This area does bleed easily with manipulation. There are no mucosal lesions noted. On bimanual and rectovaginal examination there no masses palpated.   Lab Findings: Lab Results  Component Value Date   WBC 9.0 09/23/2011   HGB 10.8* 09/23/2011   HCT 33.7* 09/23/2011   MCV 96.8 09/23/2011   PLT 226 09/23/2011    @LASTCHEM @  Radiographic Findings: No results found.  Impression:  No evidence of recurrence on clinical exam today, Pap smear pending.  Plan:  Routine  followup in 6 months. In the interim the patient will be seen in gynecologic oncology.  _____________________________________   Blair Promise, PhD, MD

## 2012-06-09 ENCOUNTER — Other Ambulatory Visit (HOSPITAL_COMMUNITY)
Admission: RE | Admit: 2012-06-09 | Discharge: 2012-06-09 | Disposition: A | Discharge status: Home / Self Care | Payer: Medicare Other | Source: Ambulatory Visit | Attending: Radiation Oncology | Admitting: Radiation Oncology

## 2012-06-09 ENCOUNTER — Encounter: Payer: Self-pay | Admitting: Radiation Oncology

## 2012-06-09 ENCOUNTER — Ambulatory Visit
Admission: RE | Admit: 2012-06-09 | Discharge: 2012-06-09 | Disposition: A | Discharge status: Home / Self Care | Payer: Medicare Other | Source: Ambulatory Visit | Attending: Radiation Oncology | Admitting: Radiation Oncology

## 2012-06-09 ENCOUNTER — Ambulatory Visit: Payer: Medicare Other | Admitting: Radiation Oncology

## 2012-06-09 VITALS — BP 135/71 | HR 57 | Temp 97.4°F | Resp 16 | Wt 114.0 lb

## 2012-06-09 DIAGNOSIS — C549 Malignant neoplasm of corpus uteri, unspecified: Secondary | ICD-10-CM

## 2012-06-09 DIAGNOSIS — Z124 Encounter for screening for malignant neoplasm of cervix: Secondary | ICD-10-CM | POA: Insufficient documentation

## 2012-06-09 NOTE — Progress Notes (Signed)
Radiation Oncology         [phone] ________________________________  Name: [name] MRN: [id number]  Date: 06/09/2012  DOB: [birth year]  Follow-Up Visit Note  CC: Tamsen Roers, MD  Marti Sleigh *  Diagnosis:   Stage I-B high-grade endometrioid adenocarcinoma  Interval Since Last Radiation:  18 months  Narrative:  The patient returns today for routine follow-up.  She seems to be doing reasonably well at this time. She is however having more problems with left-sided weakness related to her stroke. She will be moving to the [place] home in the next several weeks. Patient denies any vaginal bleeding or pelvic pain or flank pain.                              ALLERGIES:  is allergic to penicillins.  Meds: Current Outpatient Prescriptions  Medication Sig Dispense Refill  . acetaminophen (TYLENOL) 500 MG tablet Take 1,000 mg by mouth every 6 (six) hours as needed. For pain.      Marland Kitchen ALPRAZolam (XANAX) 0.25 MG tablet Take 0.25 mg by mouth at bedtime as needed.      . bisoprolol (ZEBETA) 10 MG tablet Take 10 mg by mouth daily.      . bisoprolol-hydrochlorothiazide (ZIAC) 2.5-6.25 MG per tablet       . carbidopa-levodopa (SINEMET IR) 25-100 MG per tablet Take 1 tablet by mouth 2 (two) times daily.      . Cholecalciferol (VITAMIN D) 2000 UNITS CAPS Take 1 capsule by mouth daily.      . cloNIDine (CATAPRES) 0.3 MG tablet Take 0.3 mg by mouth 2 (two) times daily.      Marland Kitchen FLUoxetine (PROZAC) 20 MG capsule Take 20 mg by mouth daily.      Marland Kitchen HYDROcodone-acetaminophen (NORCO/VICODIN) 5-325 MG per tablet       . insulin glargine (LANTUS) 100 UNIT/ML injection Inject 38 Units into the skin daily.      Marland Kitchen lisinopril (PRINIVIL,ZESTRIL) 20 MG tablet Take 20 mg by mouth daily.      Marland Kitchen lovastatin (MEVACOR) 20 MG tablet Take 20 mg by mouth at bedtime.      . meloxicam (MOBIC) 15 MG tablet       . metFORMIN (GLUCOPHAGE) 500 MG tablet Take 500 mg by mouth 2 (two) times daily with a meal.       . polyethylene glycol (MIRALAX / GLYCOLAX) packet Take 17 g by mouth daily.      . cephALEXin (KEFLEX) 500 MG capsule         Physical Findings: The patient is in no acute distress. Patient is alert and oriented.  weight is 114 lb (51.71 kg). Her oral temperature is 97.4 F (36.3 C). Her blood pressure is 135/71 and her pulse is 57. Her respiration is 16. Marland Kitchen No palpable supraclavicular or axillary adenopathy. The lungs are clear to auscultation. The heart has regular rhythm and rate. The abdomen is soft and nontender with normal bowel sounds. The inguinal areas are free of adenopathy. On pelvic examination the external genitalia are unremarkable. Speculum exam is performed. There are no mucosal lesions noted in the vaginal vault. A Pap smear was obtained of the proximal vagina. On bimanual and rectovaginal examination there no pelvic masses appreciated.  Lab Findings: Lab Results  Component Value Date   WBC 9.0 09/23/2011   HGB 10.8* 09/23/2011   HCT 33.7* 09/23/2011   MCV 96.8 09/23/2011   PLT 226  09/23/2011      Radiographic Findings: No results found.  Impression:  No evidence of recurrence on clinical exam today, Pap smear pending  Plan:  Routine followup in 6 months. Have asked the patient to schedule a followup in gynecologic oncology in 3 months.  _____________________________________  -----------------------------------  Blair Promise, PhD, MD

## 2012-06-09 NOTE — Progress Notes (Signed)
Patient presents to the clinic today accompanied by her husband for a follow up appointment with Dr. Sondra Come. Patient is alert and oriented to person, place, and time. No distress noted. Patient being pushed in wheelchair because she is unable to ambulate related to effects of stroke. Pleasant affect noted. Patient denies pain at this time. However, patient reports occasional low abdominal pain related to effects of constipation. Educated patient reference constipation and provided patient with reference handout. Patient and husband both verbalized understanding. Patient reports hot flashes continue. Patient denies vaginal discharge or bleeding. Patient denies rectal bleeding. Patient reports passing flatus today. Patient denies using dilator. Reported all findings to Dr. Sondra Come.

## 2012-06-09 NOTE — Addendum Note (Signed)
Encounter addended by: Blair Promise, MD on: 06/09/2012  2:29 PM<BR>     Documentation filed: Orders

## 2012-06-16 ENCOUNTER — Telehealth: Payer: Self-pay | Admitting: Radiation Oncology

## 2012-06-16 NOTE — Telephone Encounter (Signed)
Message copied by Heywood Footman on Mon Jun 16, 2012  1:16 PM ------      Message from: Gery Pray D      Created: Mon Jun 16, 2012  8:19 AM       Sam,             Please inform pt. of good pap smear results.                                                                            Thanks, jk      ----- Message -----         From: [facility]         Sent: 06/16/2012   7:43 AM           To: Blair Promise, MD

## 2012-06-16 NOTE — Telephone Encounter (Signed)
Called patient at home number listed in demographics informing her that her paps smear results were good. Patient verbalized understanding and expressed appreciation for the call. Routed this message to Dr. Sondra Come.

## 2012-09-01 ENCOUNTER — Ambulatory Visit: Payer: Medicare Other | Admitting: Gynecology

## 2012-09-12 ENCOUNTER — Telehealth: Payer: Self-pay | Admitting: *Deleted

## 2012-09-12 NOTE — Telephone Encounter (Signed)
Called contact number listed in epic for patient, it is no longer a working number. Call contact number for daughter(carol) listed in chart [phone].  [name] stated that pt has been living in [facility] for the last 3 months. She was unaware of a scheduled appointment for Ms. Brozowski.  Ms. Krausz daughter was at work and stated she would call back on Monday so a new appointment could be scheduled.

## 2012-12-05 ENCOUNTER — Encounter: Payer: Self-pay | Admitting: Radiation Oncology

## 2012-12-05 DIAGNOSIS — Z923 Personal history of irradiation: Secondary | ICD-10-CM | POA: Insufficient documentation

## 2012-12-08 ENCOUNTER — Ambulatory Visit
Admission: RE | Admit: 2012-12-08 | Discharge: 2012-12-08 | Disposition: A | Discharge status: Home / Self Care | Payer: Medicare Other | Source: Ambulatory Visit | Attending: Radiation Oncology | Admitting: Radiation Oncology

## 2012-12-08 ENCOUNTER — Other Ambulatory Visit (HOSPITAL_COMMUNITY)
Admission: RE | Admit: 2012-12-08 | Discharge: 2012-12-08 | Disposition: A | Discharge status: Home / Self Care | Payer: Medicare Other | Source: Ambulatory Visit | Attending: Radiation Oncology | Admitting: Radiation Oncology

## 2012-12-08 ENCOUNTER — Encounter: Payer: Self-pay | Admitting: Radiation Oncology

## 2012-12-08 VITALS — BP 184/63 | HR 58 | Temp 98.2°F | Resp 20

## 2012-12-08 DIAGNOSIS — C549 Malignant neoplasm of corpus uteri, unspecified: Secondary | ICD-10-CM

## 2012-12-08 DIAGNOSIS — Z124 Encounter for screening for malignant neoplasm of cervix: Secondary | ICD-10-CM | POA: Insufficient documentation

## 2012-12-08 NOTE — Progress Notes (Signed)
Pt resides at [facility] home. Pt denies pain, bowel issues, vaginal discharge, fatigue. Pt reports she occasionally has difficulty initiating voiding, denies dysuria, foul odor. She states she just completed antibiotic for left elbow infection. Her left elbow is slightly swollen w/raised dry pink areas. Pt states her room mate has scabies, and she feels she may have scabies. She states her elbow is much better since taking antibiotics. Per pt's medication list from [facility], she completed a Z pack on 12/07/12.

## 2012-12-08 NOTE — Progress Notes (Signed)
Radiation Oncology         [phone] ________________________________  Name: [name] MRN: [id number]  Date: 12/08/2012  DOB: May 11, 1929  Follow-Up Visit Note  CC: Tamsen Roers, MD  Marti Sleigh *  Diagnosis:   Stage IB high-grade endometrioid adenocarcinoma  Interval Since Last Radiation:  2  years  Narrative:  The patient returns today for routine follow-up.  She is without complaints. She currently resides in the [facility]. She denies any vaginal bleeding or hematuria. She denies any pain within the pelvis area. She denies any rectal bleeding except that associated with occasional straining                              ALLERGIES:  is allergic to penicillins.  Meds: Current Outpatient Prescriptions  Medication Sig Dispense Refill  . ALPRAZolam (XANAX) 0.25 MG tablet Take 0.25 mg by mouth at bedtime as needed.      . bisoprolol-hydrochlorothiazide (ZIAC) 2.5-6.25 MG per tablet       . carbidopa-levodopa (SINEMET IR) 25-100 MG per tablet Take 1 tablet by mouth 2 (two) times daily.      . cloNIDine (CATAPRES) 0.3 MG tablet Take 0.3 mg by mouth 2 (two) times daily.      . Cyanocobalamin (VITAMIN B 12 PO) Take 1,000 mcg by mouth.      Marland Kitchen FLUoxetine (PROZAC) 20 MG capsule Take 20 mg by mouth daily.      . furosemide (LASIX) 20 MG tablet Take 20 mg by mouth.      . insulin glargine (LANTUS) 100 UNIT/ML injection Inject 38 Units into the skin daily.      Marland Kitchen lisinopril (PRINIVIL,ZESTRIL) 20 MG tablet Take 20 mg by mouth daily.      . magnesium hydroxide (MILK OF MAGNESIA) 400 MG/5ML suspension Take by mouth daily as needed for constipation.      . meloxicam (MOBIC) 15 MG tablet       . metFORMIN (GLUCOPHAGE) 500 MG tablet Take 500 mg by mouth 2 (two) times daily with a meal.      . promethazine (PHENERGAN) 25 MG suppository Place 25 mg rectally every 6 (six) hours as needed for nausea.      . promethazine (PHENERGAN) 25 MG tablet Take 25 mg by mouth every 6  (six) hours as needed for nausea.      Marland Kitchen acetaminophen (TYLENOL) 500 MG tablet Take 1,000 mg by mouth every 6 (six) hours as needed. For pain.      . cephALEXin (KEFLEX) 500 MG capsule       . Cholecalciferol (VITAMIN D) 2000 UNITS CAPS Take 1 capsule by mouth daily.      Marland Kitchen lovastatin (MEVACOR) 20 MG tablet Take 20 mg by mouth at bedtime.      . polyethylene glycol (MIRALAX / GLYCOLAX) packet Take 17 g by mouth daily.       No current facility-administered medications for this encounter.    Physical Findings: The patient is in no acute distress. Patient is alert and oriented.  oral temperature is 98.2 F (36.8 C). Her blood pressure is 184/63 and her pulse is 58. Her respiration is 20. Marland Kitchen  No palpable supraclavicular or axillary adenopathy. The lungs are clear to auscultation. The heart has a regular rhythm and rate. The abdomen is soft and nontender with normal bowel sounds. The inguinal areas are free of adenopathy. On pelvic examination the external genitalia are unremarkable.  A speculum exam is performed.. There are no mucosal lesions noted in the vaginal vault. A Pap smear was obtained of the proximal vagina. On bimanual and rectovaginal examination there no pelvic masses appreciated.  Lab Findings: Lab Results  Component Value Date   WBC 9.0 09/23/2011   HGB 10.8* 09/23/2011   HCT 33.7* 09/23/2011   MCV 96.8 09/23/2011   PLT 226 09/23/2011      Impression:  No evidence for recurrence on clinical exam today, Pap smear pending  Plan:  Routine followup in 6 months. In the interim the patient will be seen by gynecologic oncology.  _____________________________________  -----------------------------------  Blair Promise, PhD, MD

## 2012-12-15 ENCOUNTER — Telehealth: Payer: Self-pay | Admitting: *Deleted

## 2012-12-15 NOTE — Telephone Encounter (Addendum)
Pt continues to reside at [facility]. Unable to reach pt or her spouse. Spoke w/Zafira Munos, [facility] who states pt does not have a phone in her room. Per Dr Sondra Come, requested she inform pt that her 12/08/12 pap smear results are normal. Stanton Kidney verbalized understanding.

## 2013-01-19 ENCOUNTER — Emergency Department (HOSPITAL_COMMUNITY): Payer: Medicare Other

## 2013-01-19 ENCOUNTER — Encounter (HOSPITAL_COMMUNITY): Payer: Self-pay | Admitting: Emergency Medicine

## 2013-01-19 ENCOUNTER — Emergency Department (HOSPITAL_COMMUNITY)
Admission: EM | Admit: 2013-01-19 | Discharge: 2013-01-19 | Disposition: A | Discharge status: Home / Self Care | Payer: Medicare Other | Attending: Emergency Medicine | Admitting: Emergency Medicine

## 2013-01-19 DIAGNOSIS — M25551 Pain in right hip: Secondary | ICD-10-CM

## 2013-01-19 DIAGNOSIS — Z9181 History of falling: Secondary | ICD-10-CM | POA: Insufficient documentation

## 2013-01-19 DIAGNOSIS — Z8639 Personal history of other endocrine, nutritional and metabolic disease: Secondary | ICD-10-CM | POA: Insufficient documentation

## 2013-01-19 DIAGNOSIS — Z8673 Personal history of transient ischemic attack (TIA), and cerebral infarction without residual deficits: Secondary | ICD-10-CM | POA: Insufficient documentation

## 2013-01-19 DIAGNOSIS — R296 Repeated falls: Secondary | ICD-10-CM | POA: Insufficient documentation

## 2013-01-19 DIAGNOSIS — Y9289 Other specified places as the place of occurrence of the external cause: Secondary | ICD-10-CM | POA: Insufficient documentation

## 2013-01-19 DIAGNOSIS — Z8701 Personal history of pneumonia (recurrent): Secondary | ICD-10-CM | POA: Insufficient documentation

## 2013-01-19 DIAGNOSIS — I10 Essential (primary) hypertension: Secondary | ICD-10-CM | POA: Insufficient documentation

## 2013-01-19 DIAGNOSIS — Y939 Activity, unspecified: Secondary | ICD-10-CM | POA: Insufficient documentation

## 2013-01-19 DIAGNOSIS — Z79899 Other long term (current) drug therapy: Secondary | ICD-10-CM | POA: Insufficient documentation

## 2013-01-19 DIAGNOSIS — E119 Type 2 diabetes mellitus without complications: Secondary | ICD-10-CM | POA: Insufficient documentation

## 2013-01-19 DIAGNOSIS — Z8541 Personal history of malignant neoplasm of cervix uteri: Secondary | ICD-10-CM | POA: Insufficient documentation

## 2013-01-19 DIAGNOSIS — S79919A Unspecified injury of unspecified hip, initial encounter: Secondary | ICD-10-CM | POA: Insufficient documentation

## 2013-01-19 DIAGNOSIS — Z880 Allergy status to penicillin: Secondary | ICD-10-CM | POA: Insufficient documentation

## 2013-01-19 DIAGNOSIS — Z862 Personal history of diseases of the blood and blood-forming organs and certain disorders involving the immune mechanism: Secondary | ICD-10-CM | POA: Insufficient documentation

## 2013-01-19 DIAGNOSIS — Z87891 Personal history of nicotine dependence: Secondary | ICD-10-CM | POA: Insufficient documentation

## 2013-01-19 DIAGNOSIS — I498 Other specified cardiac arrhythmias: Secondary | ICD-10-CM | POA: Insufficient documentation

## 2013-01-19 DIAGNOSIS — Z923 Personal history of irradiation: Secondary | ICD-10-CM | POA: Insufficient documentation

## 2013-01-19 NOTE — ED Notes (Signed)
Report called to facility and [facility] contacted for pt transport.

## 2013-01-19 NOTE — ED Provider Notes (Signed)
CSN: [id number]     Arrival date & time 01/19/13  1933 History   None    Chief Complaint  Patient presents with  . Fall    HPI Comments: Pt is a 79 y/o female w/ PMHx of L sided hemiparesis s/p CVA in 2011, HTN, HLD, DM II, who resides at an ALF, [facility], who fell when standing to get off the commode this afternoon.  She denies any LOC, dizziness when standing, lightheadedness with micturition, and states the commode is awkward which is why she fell.  Pt fell onto her R side, onto her hip but denies any numbness, tingling, burning down her legs, pain at rest, pain with movement of her right leg.     Patient is a 79 y.o. female presenting with fall. The history is provided by the patient.  Fall Pertinent negatives include no joint swelling or myalgias.    Past Medical History  Diagnosis Date  . Diabetes mellitus   . Hypertension   . Gout   . Pneumonia   . Stroke     L sided deficits w/slurred speech  . Cancer 07/04/10    endometrial  . Falling episodes   . Hx of radiation therapy 09/13/10- 10/19/10, 6/12, 6/19, 11/14/10    pelvis, external beam tehn brachytherapy   Past Surgical History  Procedure Laterality Date  . Abdominal hysterectomy     No family history on file. History  Substance Use Topics  . Smoking status: Former Smoker    Types: Cigarettes  . Smokeless tobacco: Not on file     Comment: quit many yrs ago  . Alcohol Use: No   OB History   Grav Para Term Preterm Abortions TAB SAB Ect Mult Living                 Review of Systems  Constitutional: Negative.   HENT: Negative.   Eyes: Negative.   Respiratory: Negative.   Cardiovascular: Negative.   Gastrointestinal: Negative.   Endocrine: Negative.   Genitourinary: Negative.   Musculoskeletal: Negative for myalgias, back pain and joint swelling.  Skin: Negative.   Allergic/Immunologic: Negative.   Neurological: Negative.   Hematological: Negative.   Psychiatric/Behavioral: Negative.     Allergies   Penicillins  Home Medications   Current Outpatient Rx  Name  Route  Sig  Dispense  Refill  . acetaminophen (TYLENOL) 500 MG tablet   Oral   Take 1,000 mg by mouth every 6 (six) hours as needed. For pain.         Marland Kitchen ALPRAZolam (XANAX) 0.25 MG tablet   Oral   Take 0.25 mg by mouth at bedtime as needed.         . bisoprolol-hydrochlorothiazide (ZIAC) 2.5-6.25 MG per tablet               . carbidopa-levodopa (SINEMET IR) 25-100 MG per tablet   Oral   Take 1 tablet by mouth 2 (two) times daily.         . cephALEXin (KEFLEX) 500 MG capsule               . Cholecalciferol (VITAMIN D) 2000 UNITS CAPS   Oral   Take 1 capsule by mouth daily.         . cloNIDine (CATAPRES) 0.3 MG tablet   Oral   Take 0.3 mg by mouth 2 (two) times daily.         . Cyanocobalamin (VITAMIN B 12 PO)   Oral  Take 1,000 mcg by mouth.         Marland Kitchen FLUoxetine (PROZAC) 20 MG capsule   Oral   Take 20 mg by mouth daily.         . furosemide (LASIX) 20 MG tablet   Oral   Take 20 mg by mouth.         . insulin glargine (LANTUS) 100 UNIT/ML injection   Subcutaneous   Inject 38 Units into the skin daily.         Marland Kitchen lisinopril (PRINIVIL,ZESTRIL) 20 MG tablet   Oral   Take 20 mg by mouth daily.         Marland Kitchen lovastatin (MEVACOR) 20 MG tablet   Oral   Take 20 mg by mouth at bedtime.         . magnesium hydroxide (MILK OF MAGNESIA) 400 MG/5ML suspension   Oral   Take by mouth daily as needed for constipation.         . meloxicam (MOBIC) 15 MG tablet               . metFORMIN (GLUCOPHAGE) 500 MG tablet   Oral   Take 500 mg by mouth 2 (two) times daily with a meal.         . polyethylene glycol (MIRALAX / GLYCOLAX) packet   Oral   Take 17 g by mouth daily.         . promethazine (PHENERGAN) 25 MG suppository   Rectal   Place 25 mg rectally every 6 (six) hours as needed for nausea.         . promethazine (PHENERGAN) 25 MG tablet   Oral   Take 25 mg by mouth  every 6 (six) hours as needed for nausea.          BP 152/39  Pulse 50  Temp(Src) 98.4 F (36.9 C) (Oral)  Resp 20  SpO2 96% Physical Exam  Constitutional: She is oriented to person, place, and time. She appears well-developed and well-nourished. No distress.  HENT:  Head: Normocephalic and atraumatic.  Mouth/Throat: No oropharyngeal exudate.  Eyes: Conjunctivae and EOM are normal. Pupils are equal, round, and reactive to light.  Neck: Normal range of motion. Neck supple.  Cardiovascular: Regular rhythm and intact distal pulses.  Bradycardia present.   No murmur heard. Pulmonary/Chest: Effort normal and breath sounds normal.  Abdominal: Normal appearance. There is no tenderness.  Musculoskeletal:       Right hip: She exhibits tenderness (greater trochanter). She exhibits normal range of motion, normal strength, no swelling, no crepitus, no deformity and no laceration.  Neurological: She is alert and oriented to person, place, and time.  Skin: Skin is warm, dry and intact. No ecchymosis (around the R greater trochanter) noted.    ED Course  Procedures (including critical care time) Labs Review Labs Reviewed - No data to display Imaging Review Dg Hip Complete Right  01/19/2013   *RADIOLOGY REPORT*  Clinical Data: Fall onto right hip  RIGHT HIP - COMPLETE 2+ VIEW  Comparison: None  Findings: There is no evidence of fracture or dislocation.  There is no evidence of arthropathy or other focal bone abnormality.  Soft tissues are unremarkable.  IMPRESSION: Negative exam.   Original Report Authenticated By: Kerby Moors, M.D.    MDM   1. Right hip pain   Pt with fall onto her R hip with some bony tenderness around the greater trochanter.  Will get A/P, lateral, abduction views of the  R hip to evaluate for possible fx.    10:08 PM - R hip film negative for acute fx.  Will d/c back to her facility and tylenol for pain.   Tamela Oddi Awanda Mink, DO of [facility] 01/19/2013,  10:08 PM      Nolon Rod, DO 01/19/13 2208

## 2013-01-19 NOTE — ED Notes (Addendum)
Per EMS: Pt is from [facility]. Pt reports falling due to slippery floor. Pt denies LOC, however did hit the top of her head while falling. Pt is not on anticoagulant therapy. Pt denies any pain currently. Facility reports that the facility has had an outbreak of scabies. Pt has an active DNR.

## 2013-01-19 NOTE — ED Provider Notes (Signed)
I saw and evaluated the patient, reviewed the resident's note and I agree with the findings and plan.   .Face to face Exam:  General:  Awake HEENT:  Atraumatic Resp:  Normal effort Abd:  Nondistended Neuro:No focal weakness  Dot Lanes, MD 01/19/13 2228

## 2013-01-19 NOTE — ED Notes (Signed)
Bed: WA10 Expected date: 01/19/13 Expected time: 7:07 PM Means of arrival: Ambulance Comments: Fall, no pain

## 2013-03-13 ENCOUNTER — Encounter (INDEPENDENT_AMBULATORY_CARE_PROVIDER_SITE_OTHER): Payer: Self-pay

## 2013-03-13 ENCOUNTER — Ambulatory Visit: Payer: Medicare Other | Attending: Gynecology | Admitting: Gynecology

## 2013-03-13 ENCOUNTER — Encounter: Payer: Self-pay | Admitting: Gynecology

## 2013-03-13 VITALS — BP 148/67 | HR 78 | Temp 97.7°F | Resp 20

## 2013-03-13 DIAGNOSIS — Z79899 Other long term (current) drug therapy: Secondary | ICD-10-CM | POA: Insufficient documentation

## 2013-03-13 DIAGNOSIS — C541 Malignant neoplasm of endometrium: Secondary | ICD-10-CM

## 2013-03-13 DIAGNOSIS — C549 Malignant neoplasm of corpus uteri, unspecified: Secondary | ICD-10-CM | POA: Insufficient documentation

## 2013-03-13 DIAGNOSIS — Z90710 Acquired absence of both cervix and uterus: Secondary | ICD-10-CM | POA: Insufficient documentation

## 2013-03-13 DIAGNOSIS — I10 Essential (primary) hypertension: Secondary | ICD-10-CM | POA: Insufficient documentation

## 2013-03-13 DIAGNOSIS — Z794 Long term (current) use of insulin: Secondary | ICD-10-CM | POA: Insufficient documentation

## 2013-03-13 DIAGNOSIS — Z880 Allergy status to penicillin: Secondary | ICD-10-CM | POA: Insufficient documentation

## 2013-03-13 DIAGNOSIS — Z923 Personal history of irradiation: Secondary | ICD-10-CM | POA: Insufficient documentation

## 2013-03-13 DIAGNOSIS — E119 Type 2 diabetes mellitus without complications: Secondary | ICD-10-CM | POA: Insufficient documentation

## 2013-03-13 DIAGNOSIS — Z8673 Personal history of transient ischemic attack (TIA), and cerebral infarction without residual deficits: Secondary | ICD-10-CM | POA: Insufficient documentation

## 2013-03-13 DIAGNOSIS — Z87891 Personal history of nicotine dependence: Secondary | ICD-10-CM | POA: Insufficient documentation

## 2013-03-13 NOTE — Patient Instructions (Signed)
Return to see [place] in one year.

## 2013-03-13 NOTE — Progress Notes (Signed)
Consult Note: Gyn-Onc   [name] 79 y.o. female  Chief Complaint  Patient presents with  . Endometrial cancer    Follow up    Assessment : Stage IB grade 3 endometrial cancer 2012 no evidence disease.  Plan: The patient return to see Dr. Freddi Che is scheduled to return to see [place] in one year.  Interval History: Patient returns today for followup. Since her last visit with [place] she has been seen by Dr. Sondra Come. The patient reports no GI or GU symptoms. She is confined to wheelchair. She denies any vaginal bleeding or discharge.Marland Kitchen  HPI: Stage IB grade 3 endometrial cancer undergoing initial total abdominal hysterectomy and bilateral salpingo-oophorectomy in February 2012. Because of high-risk features the patient received whole pelvis radiation therapy postoperatively. She had an uncomplicated surgical course and tolerated radiation therapy well.  Review of Systems:10 point review of systems is negative except as noted in interval history.   Vitals: Blood pressure 148/67, pulse 78, temperature 97.7 F (36.5 C), temperature source Oral, resp. rate 20, height 0' (0 m), weight 0 lb (0 kg).  Physical Exam: General : The patient is a healthy woman in no acute distress. Although she is disabled requiring assistance on the exam table and 2 and from her wheelchair. HEENT: normocephalic, extraoccular movements normal; neck is supple without thyromegally  Lynphnodes: Supraclavicular and inguinal nodes not enlarged  Abdomen: Soft, non-tender, no ascites, no organomegally, no masses, no hernias  Pelvic:  EGBUS: Normal female  Vagina: Normal, no lesions  Urethra and Bladder: Normal, non-tender  Cervix: Surgically absent  Uterus: Surgically absent  Bi-manual examination: Non-tender; no adenxal masses or nodularity  Rectal: normal sphincter tone, no masses, no blood  Lower extremities: No edema or varicosities. Normal range of motion      Allergies  Allergen Reactions  . Penicillins Other  (See Comments)    unknown    Past Medical History  Diagnosis Date  . Diabetes mellitus   . Hypertension   . Gout   . Pneumonia   . Stroke     L sided deficits w/slurred speech  . Cancer 07/04/10    endometrial  . Falling episodes   . Hx of radiation therapy 09/13/10- 10/19/10, 6/12, 6/19, 11/14/10    pelvis, external beam tehn brachytherapy    Past Surgical History  Procedure Laterality Date  . Abdominal hysterectomy      Current Outpatient Prescriptions  Medication Sig Dispense Refill  . amLODipine (NORVASC) 5 MG tablet Take 5 mg by mouth 2 (two) times daily.      . bisoprolol-hydrochlorothiazide (ZIAC) 10-6.25 MG per tablet Take 1 tablet by mouth 2 (two) times daily.      . Cholecalciferol (VITAMIN D PO) Take 2,000 Units by mouth daily.      . cloNIDine (CATAPRES) 0.3 MG tablet Take 0.3 mg by mouth 2 (two) times daily.      . Cyanocobalamin (VITAMIN B 12 PO) Take 1,000 mcg by mouth.      . divalproex (DEPAKOTE ER) 500 MG 24 hr tablet Take 500 mg by mouth at bedtime.      . fish oil-omega-3 fatty acids 1000 MG capsule Take 2 g by mouth daily.      Marland Kitchen FLUoxetine (PROZAC) 20 MG capsule Take 20 mg by mouth daily.      . furosemide (LASIX) 20 MG tablet Take 20 mg by mouth daily as needed for edema.       . Insulin Glargine (LANTUS SOLOSTAR) 100 UNIT/ML  SOPN Inject 38 Units into the skin every morning.      Marland Kitchen lisinopril (PRINIVIL,ZESTRIL) 20 MG tablet Take 20 mg by mouth daily.      Marland Kitchen losartan (COZAAR) 100 MG tablet Take 100 mg by mouth 2 (two) times daily.      . meloxicam (MOBIC) 15 MG tablet       . metFORMIN (GLUCOPHAGE) 500 MG tablet Take 500 mg by mouth 2 (two) times daily with a meal.      . potassium chloride (K-DUR,KLOR-CON) 10 MEQ tablet Take 10 mEq by mouth daily.      . QUEtiapine Fumarate (SEROQUEL PO) Take 150 mg by mouth at bedtime.      . traZODone (DESYREL) 50 MG tablet Take 50 mg by mouth at bedtime.      . ALPRAZolam (XANAX) 0.25 MG tablet Take 0.25 mg by mouth at  bedtime as needed for anxiety.       . carbidopa-levodopa (SINEMET IR) 25-100 MG per tablet Take 1 tablet by mouth 4 (four) times daily.       . furosemide (LASIX) 40 MG tablet Take 40 mg by mouth every morning.      . promethazine (PHENERGAN) 25 MG suppository Place 25 mg rectally every 6 (six) hours as needed for nausea.      . promethazine (PHENERGAN) 25 MG tablet Take 25 mg by mouth every 6 (six) hours as needed for nausea.       No current facility-administered medications for this visit.    History   Social History  . Marital Status: Legally Separated    Spouse Name: N/A    Number of Children: N/A  . Years of Education: N/A   Occupational History  . Not on file.   Social History Main Topics  . Smoking status: Former Smoker    Types: Cigarettes  . Smokeless tobacco: Not on file     Comment: quit many yrs ago  . Alcohol Use: No  . Drug Use:   . Sexual Activity:    Other Topics Concern  . Not on file   Social History Narrative  . No narrative on file    No family history on file.    Alvino Chapel, MD 03/13/2013, 4:26 PM

## 2013-07-20 ENCOUNTER — Encounter (HOSPITAL_COMMUNITY): Payer: Self-pay | Admitting: Emergency Medicine

## 2013-07-20 ENCOUNTER — Emergency Department (HOSPITAL_COMMUNITY): Payer: Medicare Other

## 2013-07-20 ENCOUNTER — Inpatient Hospital Stay (HOSPITAL_COMMUNITY): Payer: Medicare Other

## 2013-07-20 ENCOUNTER — Inpatient Hospital Stay (HOSPITAL_COMMUNITY)
Admission: EM | Admit: 2013-07-20 | Discharge: 2013-07-24 | DRG: 682 | Disposition: A | Discharge status: Nursing Home (Includes ICF) | Payer: Medicare Other | Attending: Internal Medicine | Admitting: Internal Medicine

## 2013-07-20 DIAGNOSIS — Z880 Allergy status to penicillin: Secondary | ICD-10-CM

## 2013-07-20 DIAGNOSIS — N184 Chronic kidney disease, stage 4 (severe): Secondary | ICD-10-CM

## 2013-07-20 DIAGNOSIS — I129 Hypertensive chronic kidney disease with stage 1 through stage 4 chronic kidney disease, or unspecified chronic kidney disease: Secondary | ICD-10-CM | POA: Diagnosis present

## 2013-07-20 DIAGNOSIS — H02409 Unspecified ptosis of unspecified eyelid: Secondary | ICD-10-CM | POA: Diagnosis present

## 2013-07-20 DIAGNOSIS — Z87891 Personal history of nicotine dependence: Secondary | ICD-10-CM

## 2013-07-20 DIAGNOSIS — Z8542 Personal history of malignant neoplasm of other parts of uterus: Secondary | ICD-10-CM

## 2013-07-20 DIAGNOSIS — G9341 Metabolic encephalopathy: Secondary | ICD-10-CM | POA: Diagnosis present

## 2013-07-20 DIAGNOSIS — E871 Hypo-osmolality and hyponatremia: Secondary | ICD-10-CM | POA: Diagnosis present

## 2013-07-20 DIAGNOSIS — E860 Dehydration: Secondary | ICD-10-CM | POA: Diagnosis present

## 2013-07-20 DIAGNOSIS — D631 Anemia in chronic kidney disease: Secondary | ICD-10-CM | POA: Diagnosis present

## 2013-07-20 DIAGNOSIS — I69959 Hemiplegia and hemiparesis following unspecified cerebrovascular disease affecting unspecified side: Secondary | ICD-10-CM

## 2013-07-20 DIAGNOSIS — R443 Hallucinations, unspecified: Secondary | ICD-10-CM | POA: Diagnosis not present

## 2013-07-20 DIAGNOSIS — Z8249 Family history of ischemic heart disease and other diseases of the circulatory system: Secondary | ICD-10-CM

## 2013-07-20 DIAGNOSIS — IMO0001 Reserved for inherently not codable concepts without codable children: Secondary | ICD-10-CM | POA: Diagnosis present

## 2013-07-20 DIAGNOSIS — N179 Acute kidney failure, unspecified: Principal | ICD-10-CM | POA: Diagnosis present

## 2013-07-20 DIAGNOSIS — IMO0002 Reserved for concepts with insufficient information to code with codable children: Secondary | ICD-10-CM

## 2013-07-20 DIAGNOSIS — M109 Gout, unspecified: Secondary | ICD-10-CM | POA: Diagnosis present

## 2013-07-20 DIAGNOSIS — R4182 Altered mental status, unspecified: Secondary | ICD-10-CM

## 2013-07-20 DIAGNOSIS — Z794 Long term (current) use of insulin: Secondary | ICD-10-CM

## 2013-07-20 DIAGNOSIS — N189 Chronic kidney disease, unspecified: Secondary | ICD-10-CM | POA: Diagnosis present

## 2013-07-20 DIAGNOSIS — G9340 Encephalopathy, unspecified: Secondary | ICD-10-CM | POA: Diagnosis present

## 2013-07-20 DIAGNOSIS — D539 Nutritional anemia, unspecified: Secondary | ICD-10-CM | POA: Diagnosis present

## 2013-07-20 DIAGNOSIS — E1122 Type 2 diabetes mellitus with diabetic chronic kidney disease: Secondary | ICD-10-CM | POA: Diagnosis present

## 2013-07-20 DIAGNOSIS — E875 Hyperkalemia: Secondary | ICD-10-CM | POA: Diagnosis present

## 2013-07-20 DIAGNOSIS — I639 Cerebral infarction, unspecified: Secondary | ICD-10-CM | POA: Diagnosis present

## 2013-07-20 DIAGNOSIS — N039 Chronic nephritic syndrome with unspecified morphologic changes: Secondary | ICD-10-CM

## 2013-07-20 DIAGNOSIS — I10 Essential (primary) hypertension: Secondary | ICD-10-CM | POA: Diagnosis present

## 2013-07-20 DIAGNOSIS — Z923 Personal history of irradiation: Secondary | ICD-10-CM

## 2013-07-20 DIAGNOSIS — R197 Diarrhea, unspecified: Secondary | ICD-10-CM | POA: Diagnosis not present

## 2013-07-20 DIAGNOSIS — I69922 Dysarthria following unspecified cerebrovascular disease: Secondary | ICD-10-CM

## 2013-07-20 DIAGNOSIS — Z79899 Other long term (current) drug therapy: Secondary | ICD-10-CM

## 2013-07-20 DIAGNOSIS — E1165 Type 2 diabetes mellitus with hyperglycemia: Secondary | ICD-10-CM

## 2013-07-20 LAB — COMPREHENSIVE METABOLIC PANEL
ALBUMIN: 2.9 g/dL — AB (ref 3.5–5.2)
ALK PHOS: 38 U/L — AB (ref 39–117)
ALT: 13 U/L (ref 0–35)
AST: 17 U/L (ref 0–37)
BILIRUBIN TOTAL: 0.2 mg/dL — AB (ref 0.3–1.2)
BUN: 87 mg/dL — ABNORMAL HIGH (ref 6–23)
CHLORIDE: 94 meq/L — AB (ref 96–112)
CO2: 20 meq/L (ref 19–32)
Calcium: 9.6 mg/dL (ref 8.4–10.5)
Creatinine, Ser: 5.3 mg/dL — ABNORMAL HIGH (ref 0.50–1.10)
GFR calc Af Amer: 8 mL/min — ABNORMAL LOW (ref >90)
GFR calc non Af Amer: 7 mL/min — ABNORMAL LOW (ref >90)
Glucose, Bld: 185 mg/dL — ABNORMAL HIGH (ref 70–99)
POTASSIUM: 5.7 meq/L — AB (ref 3.7–5.3)
Sodium: 134 mEq/L — ABNORMAL LOW (ref 137–147)
Total Protein: 6.6 g/dL (ref 6.0–8.3)

## 2013-07-20 LAB — GLUCOSE, CAPILLARY: Glucose-Capillary: 99 mg/dL (ref 70–99)

## 2013-07-20 LAB — URINALYSIS, ROUTINE W REFLEX MICROSCOPIC
GLUCOSE, UA: NEGATIVE mg/dL
Hgb urine dipstick: NEGATIVE
Ketones, ur: NEGATIVE mg/dL
LEUKOCYTES UA: NEGATIVE
NITRITE: NEGATIVE
Protein, ur: NEGATIVE mg/dL
SPECIFIC GRAVITY, URINE: 1.019 (ref 1.005–1.030)
Urobilinogen, UA: 0.2 mg/dL (ref 0.0–1.0)
pH: 5 (ref 5.0–8.0)

## 2013-07-20 LAB — CBC WITH DIFFERENTIAL/PLATELET
BASOS ABS: 0 10*3/uL (ref 0.0–0.1)
BASOS PCT: 1 % (ref 0–1)
EOS ABS: 0.3 10*3/uL (ref 0.0–0.7)
EOS PCT: 5 % (ref 0–5)
HCT: 27 % — ABNORMAL LOW (ref 36.0–46.0)
Hemoglobin: 8.8 g/dL — ABNORMAL LOW (ref 12.0–15.0)
LYMPHS PCT: 27 % (ref 12–46)
Lymphs Abs: 1.7 10*3/uL (ref 0.7–4.0)
MCH: 29.6 pg (ref 26.0–34.0)
MCHC: 32.6 g/dL (ref 30.0–36.0)
MCV: 90.9 fL (ref 78.0–100.0)
Monocytes Absolute: 0.6 10*3/uL (ref 0.1–1.0)
Monocytes Relative: 9 % (ref 3–12)
Neutro Abs: 3.8 10*3/uL (ref 1.7–7.7)
Neutrophils Relative %: 59 % (ref 43–77)
PLATELETS: 213 10*3/uL (ref 150–400)
RBC: 2.97 MIL/uL — ABNORMAL LOW (ref 3.87–5.11)
RDW: 15.6 % — AB (ref 11.5–15.5)
WBC: 6.4 10*3/uL (ref 4.0–10.5)

## 2013-07-20 LAB — I-STAT CHEM 8, ED
BUN: 97 mg/dL — AB (ref 6–23)
CALCIUM ION: 1.21 mmol/L (ref 1.13–1.30)
CHLORIDE: 99 meq/L (ref 96–112)
Creatinine, Ser: 6.2 mg/dL — ABNORMAL HIGH (ref 0.50–1.10)
GLUCOSE: 186 mg/dL — AB (ref 70–99)
HCT: 29 % — ABNORMAL LOW (ref 36.0–46.0)
Hemoglobin: 9.9 g/dL — ABNORMAL LOW (ref 12.0–15.0)
Potassium: 5.5 mEq/L — ABNORMAL HIGH (ref 3.7–5.3)
Sodium: 133 mEq/L — ABNORMAL LOW (ref 137–147)
TCO2: 23 mmol/L (ref 0–100)

## 2013-07-20 LAB — POC OCCULT BLOOD, ED: FECAL OCCULT BLD: NEGATIVE

## 2013-07-20 LAB — BASIC METABOLIC PANEL
BUN: 82 mg/dL — AB (ref 6–23)
CHLORIDE: 98 meq/L (ref 96–112)
CO2: 24 mEq/L (ref 19–32)
Calcium: 9.8 mg/dL (ref 8.4–10.5)
Creatinine, Ser: 4.5 mg/dL — ABNORMAL HIGH (ref 0.50–1.10)
GFR calc Af Amer: 10 mL/min — ABNORMAL LOW (ref >90)
GFR, EST NON AFRICAN AMERICAN: 8 mL/min — AB (ref >90)
Glucose, Bld: 110 mg/dL — ABNORMAL HIGH (ref 70–99)
Potassium: 5.5 mEq/L — ABNORMAL HIGH (ref 3.7–5.3)
Sodium: 139 mEq/L (ref 137–147)

## 2013-07-20 LAB — CBG MONITORING, ED: GLUCOSE-CAPILLARY: 164 mg/dL — AB (ref 70–99)

## 2013-07-20 LAB — APTT: aPTT: 37 seconds (ref 24–37)

## 2013-07-20 LAB — I-STAT TROPONIN, ED: Troponin i, poc: 0.01 ng/mL (ref 0.00–0.08)

## 2013-07-20 LAB — PROTIME-INR
INR: 1.07 (ref 0.00–1.49)
Prothrombin Time: 13.7 seconds (ref 11.6–15.2)

## 2013-07-20 LAB — VALPROIC ACID LEVEL: VALPROIC ACID LVL: 28.1 ug/mL — AB (ref 50.0–100.0)

## 2013-07-20 MED ORDER — ACETAMINOPHEN 650 MG RE SUPP: 650.0000 mg | RECTAL | Status: DC | PRN

## 2013-07-20 MED ORDER — ACETAMINOPHEN 325 MG PO TABS
650.0000 mg | ORAL_TABLET | ORAL | Status: DC | PRN
  Administered 2013-07-21: 650 mg via ORAL
  Filled 2013-07-20: qty 2

## 2013-07-20 MED ORDER — ALLOPURINOL 150 MG HALF TABLET
150.0000 mg | ORAL_TABLET | Freq: Every day | ORAL | Status: DC
  Administered 2013-07-20 – 2013-07-23 (×4): 150 mg via ORAL
  Filled 2013-07-20 (×6): qty 1

## 2013-07-20 MED ORDER — SODIUM CHLORIDE 0.9 % IV BOLUS (SEPSIS)
1000.0000 mL | Freq: Once | INTRAVENOUS | Status: AC
  Administered 2013-07-20: 1000 mL via INTRAVENOUS

## 2013-07-20 MED ORDER — INSULIN ASPART 100 UNIT/ML ~~LOC~~ SOLN
0.0000 [IU] | Freq: Every day | SUBCUTANEOUS | Status: DC
  Administered 2013-07-22: 2 [IU] via SUBCUTANEOUS

## 2013-07-20 MED ORDER — VITAMIN D 1000 UNITS PO TABS
8000.0000 [IU] | ORAL_TABLET | Freq: Every day | ORAL | Status: DC
  Administered 2013-07-21 – 2013-07-23 (×3): 8000 [IU] via ORAL
  Filled 2013-07-20 (×4): qty 8

## 2013-07-20 MED ORDER — AMLODIPINE BESYLATE 5 MG PO TABS
5.0000 mg | ORAL_TABLET | Freq: Every day | ORAL | Status: DC
  Administered 2013-07-21 – 2013-07-24 (×4): 5 mg via ORAL
  Filled 2013-07-20 (×4): qty 1

## 2013-07-20 MED ORDER — SODIUM CHLORIDE 0.9 % IV SOLN
INTRAVENOUS | Status: AC
  Administered 2013-07-21 (×2): via INTRAVENOUS

## 2013-07-20 MED ORDER — ASPIRIN 325 MG PO TABS
325.0000 mg | ORAL_TABLET | Freq: Every day | ORAL | Status: DC
Start: 2013-07-20 — End: 2013-07-24
  Administered 2013-07-20 – 2013-07-24 (×5): 325 mg via ORAL
  Filled 2013-07-20 (×5): qty 1

## 2013-07-20 MED ORDER — SENNOSIDES-DOCUSATE SODIUM 8.6-50 MG PO TABS: 1.0000 | ORAL_TABLET | Freq: Every evening | ORAL | Status: DC | PRN

## 2013-07-20 MED ORDER — POLYVINYL ALCOHOL 1.4 % OP SOLN
1.0000 [drp] | Freq: Two times a day (BID) | OPHTHALMIC | Status: DC
  Administered 2013-07-21 – 2013-07-24 (×6): 1 [drp] via OPHTHALMIC
  Filled 2013-07-20: qty 15

## 2013-07-20 MED ORDER — DEXTROSE 50 % IV SOLN
INTRAVENOUS | Status: AC
  Administered 2013-07-20: 50 mL
  Filled 2013-07-20: qty 50

## 2013-07-20 MED ORDER — INSULIN ASPART 100 UNIT/ML ~~LOC~~ SOLN
0.0000 [IU] | Freq: Three times a day (TID) | SUBCUTANEOUS | Status: DC
  Administered 2013-07-21 (×3): 2 [IU] via SUBCUTANEOUS
  Administered 2013-07-22: 3 [IU] via SUBCUTANEOUS
  Administered 2013-07-22: 5 [IU] via SUBCUTANEOUS
  Administered 2013-07-22: 3 [IU] via SUBCUTANEOUS
  Administered 2013-07-23: 2 [IU] via SUBCUTANEOUS
  Administered 2013-07-23: 5 [IU] via SUBCUTANEOUS
  Administered 2013-07-23: 3 [IU] via SUBCUTANEOUS
  Administered 2013-07-24: 2 [IU] via SUBCUTANEOUS

## 2013-07-20 MED ORDER — ENOXAPARIN SODIUM 30 MG/0.3ML ~~LOC~~ SOLN
30.0000 mg | SUBCUTANEOUS | Status: DC
  Administered 2013-07-22 – 2013-07-23 (×2): 30 mg via SUBCUTANEOUS
  Filled 2013-07-20 (×5): qty 0.3

## 2013-07-20 MED ORDER — OMEGA-3-ACID ETHYL ESTERS 1 G PO CAPS
1.0000 g | ORAL_CAPSULE | Freq: Two times a day (BID) | ORAL | Status: DC
  Administered 2013-07-20 – 2013-07-23 (×6): 1 g via ORAL
  Filled 2013-07-20 (×9): qty 1

## 2013-07-20 MED ORDER — INSULIN GLARGINE 100 UNIT/ML ~~LOC~~ SOLN
30.0000 [IU] | Freq: Every day | SUBCUTANEOUS | Status: DC
Start: 2013-07-21 — End: 2013-07-24
  Administered 2013-07-21 – 2013-07-24 (×4): 30 [IU] via SUBCUTANEOUS
  Filled 2013-07-20 (×4): qty 0.3

## 2013-07-20 MED ORDER — ASPIRIN 300 MG RE SUPP
300.0000 mg | Freq: Every day | RECTAL | Status: DC
  Filled 2013-07-20 (×5): qty 1

## 2013-07-20 MED ORDER — SODIUM POLYSTYRENE SULFONATE 15 GM/60ML PO SUSP
30.0000 g | Freq: Once | ORAL | Status: AC
Start: 2013-07-20 — End: 2013-07-20
  Administered 2013-07-20: 30 g via ORAL
  Filled 2013-07-20: qty 120

## 2013-07-20 NOTE — Consult Note (Signed)
Referring Physician: Zavits    Chief Complaint: lethargic with question of CVA  HPI:                                                                                                                                         [name] is an 80 y.o. female who was brought to hospital after [facility] noticed she was more lethargic and hard to arouse than usual. Patients grandson is in the room but he and his mother live elsewhere and only see her every two weeks. They were called after [facility] sent patient to hospital.  He notes his grandmother has a chronic left hemiparesis from her old stroke but her left eye ptosis is new.  He also notes she is more lethargic than usual and usually has no difficulty answering questions.  Patient on exam is drowsy and follows minimal commands--only after prompted multiple times. There is asymmetry of her face (however it looks more as though her left face has puffiness) and a left eye ptosis at rest (but when stimulated left eye will open fully). Patient states she has been having a runny nose and a sense of fullness in her sinuses.   Grandson also noted her BP is much lower than usual.   Date last known well: Unable to determine Time last known well: Unable to determine tPA Given: No: No exact LKN  Past Medical History  Diagnosis Date  . Diabetes mellitus   . Hypertension   . Gout   . Pneumonia   . Stroke     L sided deficits w/slurred speech  . Cancer 07/04/10    endometrial  . Falling episodes   . Hx of radiation therapy 09/13/10- 10/19/10, 6/12, 6/19, 11/14/10    pelvis, external beam tehn brachytherapy    Past Surgical History  Procedure Laterality Date  . Abdominal hysterectomy      Family History  Problem Relation Age of Onset  . Hypertension Mother   . Hypertension Father    Social History:  reports that she has quit smoking. Her smoking use included Cigarettes. She smoked 0.00 packs per day. She does not have any  smokeless tobacco history on file. She reports that she does not drink alcohol. Her drug history is not on file.  Allergies:  Allergies  Allergen Reactions  . Penicillins Other (See Comments)    unknown    Medications:  No current facility-administered medications for this encounter.   Current Outpatient Prescriptions  Medication Sig Dispense Refill  . allopurinol (ZYLOPRIM) 100 MG tablet Take 150 mg by mouth at bedtime.      Marland Kitchen amLODipine (NORVASC) 5 MG tablet Take 5 mg by mouth daily.       . bisoprolol-hydrochlorothiazide (ZIAC) 10-6.25 MG per tablet Take 1 tablet by mouth 2 (two) times daily.      . carboxymethylcellulose (REFRESH PLUS) 0.5 % SOLN Place 1 drop into both eyes 2 (two) times daily.      . Cholecalciferol (HM VITAMIN D3) 4000 UNITS CAPS Take 8,000 Units by mouth daily.      . cloNIDine (CATAPRES) 0.3 MG tablet Take 0.3 mg by mouth at bedtime.       . Cyanocobalamin (VITAMIN B 12 PO) Take 1,000 mcg by mouth.      . divalproex (DEPAKOTE ER) 250 MG 24 hr tablet Take 250 mg by mouth at bedtime.      . fish oil-omega-3 fatty acids 1000 MG capsule Take 2 g by mouth 2 (two) times daily.       Marland Kitchen FLUoxetine (PROZAC) 20 MG capsule Take 20 mg by mouth daily.      . furosemide (LASIX) 40 MG tablet Take 40 mg by mouth See admin instructions. Takes 40 mg every Monday,tuesday,thur,fri & saturday      . gabapentin (NEURONTIN) 300 MG capsule Take 300 mg by mouth at bedtime.      . insulin aspart (NOVOLOG) 100 UNIT/ML injection Inject 0-12 Units into the skin 2 (two) times daily. Sliding scale <150=0 units 151-200=4 units 201-250=6 units 251-300=8 units 301-350=10 units 351-400=12 units      . Insulin Glargine (LANTUS SOLOSTAR) 100 UNIT/ML SOPN Inject 50 Units into the skin every morning.       Marland Kitchen lisinopril (PRINIVIL,ZESTRIL) 10 MG tablet Take 10 mg by mouth daily.       Marland Kitchen LORazepam (ATIVAN) 0.5 MG tablet Take 0.5 mg by mouth every 6 (six) hours as needed for anxiety.      Marland Kitchen losartan (COZAAR) 100 MG tablet Take 100 mg by mouth 2 (two) times daily.      . meloxicam (MOBIC) 7.5 MG tablet Take 7.5 mg by mouth daily.      . metFORMIN (GLUCOPHAGE) 500 MG tablet Take 500 mg by mouth 2 (two) times daily with a meal.      . potassium chloride (K-DUR,KLOR-CON) 10 MEQ tablet Take 10 mEq by mouth daily.      . promethazine (PHENERGAN) 25 MG suppository Place 25 mg rectally every 6 (six) hours as needed for nausea.      . promethazine (PHENERGAN) 25 MG tablet Take 25 mg by mouth every 6 (six) hours as needed for nausea.      Marland Kitchen QUEtiapine (SEROQUEL) 100 MG tablet Take 100 mg by mouth at bedtime.      . traZODone (DESYREL) 50 MG tablet Take 50 mg by mouth at bedtime.         ROS:  History obtained from grandson  General ROS: negative for - chills, fatigue, fever, night sweats, weight gain or weight loss Psychological ROS: negative for - behavioral disorder, hallucinations, memory difficulties, mood swings or suicidal ideation Ophthalmic ROS: negative for - blurry vision, double vision, eye pain or loss of vision ENT ROS: negative for - epistaxis, nasal discharge, oral lesions, sore throat, tinnitus or vertigo Allergy and Immunology ROS: negative for - hives or itchy/watery eyes Hematological and Lymphatic ROS: negative for - bleeding problems, bruising or swollen lymph nodes Endocrine ROS: negative for - galactorrhea, hair pattern changes, polydipsia/polyuria or temperature intolerance Respiratory ROS: negative for - cough, hemoptysis, shortness of breath or wheezing Cardiovascular ROS: negative for - chest pain, dyspnea on exertion, edema or irregular heartbeat Gastrointestinal ROS: negative for - abdominal pain, diarrhea, hematemesis,  nausea/vomiting or stool incontinence Genito-Urinary ROS: negative for - dysuria, hematuria, incontinence or urinary frequency/urgency Musculoskeletal ROS: negative for - joint swelling or muscular weakness Neurological ROS: as noted in HPI Dermatological ROS: negative for rash and skin lesion changes  Neurologic Examination:                                                                                                      Blood pressure 124/40, pulse 63, temperature 97.7 F (36.5 C), temperature source Oral, resp. rate 16, SpO2 100.00%.  Mental Status: Drowsy, requiring me to ask her to perform tasks multiple times. Can follow only simple commands.  Speech is clear with no aphasia.  Cranial Nerves: II: Discs flat bilaterally; Visual fields shows a right upper lateral quadranopia, pupils anisocoric with right > leftequal, round, reactive to light and accommodation III,IV, VI: ptosis present left eye, extra-ocular motions intact bilaterally V,VII: smile symmetric with left face appearing more full than right, facial light touch sensation normal bilaterally VIII: hearing normal bilaterally IX,X: gag reflex present XI: bilateral shoulder shrug XII: midline tongue extension without atrophy or fasciculations  Motor: Right : Upper extremity   4+/5    Left:     Upper extremity   3/5 old  Lower extremity   4+/5     Lower extremity   33/5 old Tone and bulk:normal tone throughout; no atrophy noted Sensory: Pinprick and light touch decreased on the left arm and leg (old) Deep Tendon Reflexes:  Right: Upper Extremity   Left: Upper extremity   biceps (C-5 to C-6) 2/4   biceps (C-5 to C-6) 2/4 tricep (C7) 2/4    triceps (C7) 2/4 Brachioradialis (C6) 2/4  Brachioradialis (C6) 2/4  Lower Extremity Lower Extremity  quadriceps (L-2 to L-4) 1/4   quadriceps (L-2 to L-4) 1/4 Achilles (S1) 1/4   Achilles (S1) 1/4  Plantars: Right: downgoing   Left: downgoing Cerebellar: normal finger-to-nose on  the right,  No ataxia noted on  heel-to-shin test with right leg but patient had difficulty doing this Gait: not tested CV: pulses palpable throughout    Lab Results: Basic Metabolic Panel: No results found for this basename: NA, K, CL, CO2, GLUCOSE, BUN, CREATININE, CALCIUM, MG, PHOS,  in the last 168 hours  Liver  Function Tests: No results found for this basename: AST, ALT, ALKPHOS, BILITOT, PROT, ALBUMIN,  in the last 168 hours No results found for this basename: LIPASE, AMYLASE,  in the last 168 hours No results found for this basename: AMMONIA,  in the last 168 hours  CBC:  Recent Labs Lab 07/20/13 1210  WBC 6.4  NEUTROABS 3.8  HGB 8.8*  HCT 27.0*  MCV 90.9  PLT 213    Cardiac Enzymes: No results found for this basename: CKTOTAL, CKMB, CKMBINDEX, TROPONINI,  in the last 168 hours  Lipid Panel: No results found for this basename: CHOL, TRIG, HDL, CHOLHDL, VLDL, LDLCALC,  in the last 168 hours  CBG:  Recent Labs Lab 07/20/13 1200  GLUCAP 164*    Microbiology: Results for orders placed during the hospital encounter of 08/29/10  URINE CULTURE     Status: None   Collection Time    08/29/10  3:13 PM      Result Value Ref Range Status   Specimen Description URINE, CATHETERIZED   Final   Special Requests NONE   Final   Culture  Setup Time [id number]   Final   Colony Count NO GROWTH   Final   Culture NO GROWTH   Final   Report Status 08/30/2010 FINAL   Final    Coagulation Studies:  Recent Labs  07/20/13 1210  LABPROT 13.7  INR 1.07    Imaging: Dg Chest 2 View  07/20/2013   CLINICAL DATA:  Aphasia and weakness with history of previous tobacco use  EXAM: CHEST  2 VIEW  COMPARISON:  DG CHEST 1V PORT dated 09/23/2011  FINDINGS: The lungs are reasonably well inflated for the sitting position. There is no focal infiltrate. There is no significant pleural effusion. There is no pneumothorax. The cardiac silhouette is top-normal in size. The pulmonary vascularity  is not engorged. There is stable calcification of the pleura in the pulmonary apices.  IMPRESSION: There is no evidence of pneumonia. Mild prominence of the interstitial markings is present especially on the right which may reflect the patient's smoking history. There is no evidence of CHF.   Electronically Signed   By: David  Martinique   On: 07/20/2013 12:51   Ct Head Wo Contrast  07/20/2013   CLINICAL DATA:  Mental status change, weakness  EXAM: CT HEAD WITHOUT CONTRAST  TECHNIQUE: Contiguous axial images were obtained from the base of the skull through the vertex without intravenous contrast.  COMPARISON:  CT HEAD W/O CM dated 09/23/2011  FINDINGS: There is mild diffuse cerebral and cerebellar atrophy with compensatory ventriculomegaly. There is decreased density in the deep white matter of both cerebral hemispheres consistent with chronic small vessel ischemic change. There is no evidence of an acute ischemic or hemorrhagic infarction or evidence of other intracranial hemorrhagic process. The cerebellum and brainstem are normal in density.  At bone window settings there is chronic opacification of the left maxillary sinus. Erosive change of the bony medial maxillary sinus wall is again demonstrated and extension of soft tissue density material into the left nasal passage is present. The remainder the paranasal sinuses appear normal where visualized. The mastoid air cells are clear. There is no evidence of an acute skull fracture.  IMPRESSION: 1. There is no evidence of an acute intracranial hemorrhage nor of an evolving ischemic infarction. 2. There is mild diffuse cerebral and cerebellar atrophy with evidence of chronic small vessel ischemic type change. 3. There is chronic abnormality of the left maxillary sinus. 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   Electronically Signed   By: David  Martinique   On: 07/20/2013 13:45       Assessment and plan discussed with with attending physician and they are in  agreement.    Etta Quill PA-C [facility] [phone]  07/20/2013, 3:45 PM   Assessment: 80 y.o. female presenting to ED after nursing staff at facility had noted she was more somnolent and hard to arouse.  On arrival to ED she was noted to have a facial asymetry and left ptosis. With history of CVA and no facial features on previous CVA, cannot rule out recurrent acute CVA.      Stroke Risk Factors - diabetes mellitus and hypertension  Recommend: 1) MRI brain without contrast to evaluate for CVA.  If positive would continue with stroke work up.   2) If MRI is negative would look for underlying metabolic causes for patients drowsiness.   I personally participated in this patient's evaluation and management, including formulating above clinical impression and recommendations.  Rush Farmer M.D. [facility] [phone]

## 2013-07-20 NOTE — ED Provider Notes (Signed)
Medical screening examination/treatment/procedure(s) were conducted as a shared visit with non-physician practitioner(s) or resident and myself. I personally evaluated the patient during the encounter and agree with the findings and plan unless otherwise indicated.  I have personally reviewed any xrays and/ or EKG's with the provider and I agree with interpretation.  Worsening mild confusion/ left facial weakness since unknown time period. Stroke hx. Family reports pt close to baseline except mild facial droop. Stroke hx with known left paralysis. Exam general weakness, mild dry mm, alert/ oriented, abd soft/ NT, neck supple, PERRL, subtle left facial weakness, no arm or leg drift. Finger nose intact.  Concern for urine infection vs stroke. CT no acute findings. Plan for observation for MRI and further evaluation.  BMP shows ARF, clinically dehydrated, likely prerenal and may explain mild confusion. Fluid bolus in ED.  Neuro consulted, hospitalist to admit.  Altered mental status, Left facial weakness, Dehydration   Mariea Clonts, MD 07/20/13 [phone]

## 2013-07-20 NOTE — ED Notes (Addendum)
Spoke with Stanton Kidney, RN at [facility] about pt. States the pt has been listless and has had poor skin turgor. Has not really been able to help with getting herself dressed and eating today so they wanted her evaluated. States she has been really groggy and had her eyes closed a lot. PA made aware of conversation.

## 2013-07-20 NOTE — H&P (Addendum)
History and Physical  [name] [id number] DOB: [birth year] DOA: 07/20/2013  Referring physician: EDP PCP: Henrine Screws, MD  Outpatient Specialists:  1. None  Chief Complaint: Altered Mental Status  HPI: [name] is a 80 y.o. 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 Patient is unable to provide substantial history secondary to her mental status change. Patient's daughter and grandson at the bedside last visited the patient 2 weeks ago. As per their discussion with nursing facility, patient has been progressively weak, not able to feed herself which is unusual for her, sleeping excessively, low blood pressure, fluctuating blood sugars and hence sent to the hospital for evaluation. Also daughter noticed 2 weeks ago that patient was leaning more towards the right. In the ED, they seem to notice some facial asymmetry. In the ED, CT head without acute findings, sodium 134, potassium 5.7, chloride 94, bicarbonate 20, glucose 185, BUN 87, creatinine 5.3, albumin 2.9, hemoglobin 8.8 and chest x-ray without evidence of pneumonia. Neurology was consulted for possible stroke rule out. Hospitalist admission requested.  Review of Systems: All systems reviewed and apart from history of presenting illness, are negative.  Past Medical History  Diagnosis Date  . Diabetes mellitus   . Hypertension   . Gout   . Pneumonia   . Stroke     L sided deficits w/slurred speech  . Cancer 07/04/10    endometrial  . Falling episodes   . Hx of radiation therapy 09/13/10- 10/19/10, 6/12, 6/19, 11/14/10    pelvis, external beam tehn brachytherapy   Past Surgical History  Procedure Laterality Date  . Abdominal hysterectomy     Social History:  reports that she has quit smoking. Her smoking  use included Cigarettes. She smoked 0.00 packs per day. She does not have any smokeless tobacco history on file. She reports that she does not drink alcohol. Her drug history is not on file. Single. Resident of assisted-living facility for the last 1 year and mobile by wheelchair.  Allergies  Allergen Reactions  . Penicillins Other (See Comments)    unknown    Family History  Problem Relation Age of Onset  . Hypertension Mother   . Hypertension Father     Prior to Admission medications   Medication Sig Start Date End Date Taking? Authorizing Provider  allopurinol (ZYLOPRIM) 100 MG tablet Take 150 mg by mouth at bedtime.   Yes Historical Provider, MD  amLODipine (NORVASC) 5 MG tablet Take 5 mg by mouth daily.    Yes Historical Provider, MD  bisoprolol-hydrochlorothiazide (ZIAC) 10-6.25 MG per tablet Take 1 tablet by mouth 2 (two) times daily.   Yes Historical Provider, MD  carboxymethylcellulose (REFRESH PLUS) 0.5 % SOLN Place 1 drop into both eyes 2 (two) times daily.   Yes Historical Provider, MD  Cholecalciferol (HM VITAMIN D3) 4000 UNITS CAPS Take 8,000 Units by mouth daily.   Yes Historical Provider, MD  cloNIDine (CATAPRES) 0.3 MG tablet Take 0.3 mg by mouth at bedtime.    Yes Historical Provider, MD  Cyanocobalamin (VITAMIN B 12 PO) Take 1,000 mcg by mouth.   Yes Historical Provider, MD  divalproex (DEPAKOTE ER) 250 MG 24 hr tablet Take 250 mg by mouth at bedtime.   Yes Historical Provider, MD  fish oil-omega-3 fatty acids 1000 MG capsule Take 2  g by mouth 2 (two) times daily.    Yes Historical Provider, MD  FLUoxetine (PROZAC) 20 MG capsule Take 20 mg by mouth daily.   Yes Historical Provider, MD  furosemide (LASIX) 40 MG tablet Take 40 mg by mouth See admin instructions. Takes 40 mg every Monday,tuesday,thur,fri & saturday   Yes Historical Provider, MD  gabapentin (NEURONTIN) 300 MG capsule Take 300 mg by mouth at bedtime.   Yes Historical Provider, MD  insulin aspart (NOVOLOG)  100 UNIT/ML injection Inject 0-12 Units into the skin 2 (two) times daily. Sliding scale <150=0 units 151-200=4 units 201-250=6 units 251-300=8 units 301-350=10 units 351-400=12 units   Yes Historical Provider, MD  Insulin Glargine (LANTUS SOLOSTAR) 100 UNIT/ML SOPN Inject 50 Units into the skin every morning.    Yes Historical Provider, MD  lisinopril (PRINIVIL,ZESTRIL) 10 MG tablet Take 10 mg by mouth daily.   Yes Historical Provider, MD  LORazepam (ATIVAN) 0.5 MG tablet Take 0.5 mg by mouth every 6 (six) hours as needed for anxiety.   Yes Historical Provider, MD  losartan (COZAAR) 100 MG tablet Take 100 mg by mouth 2 (two) times daily.   Yes Historical Provider, MD  meloxicam (MOBIC) 7.5 MG tablet Take 7.5 mg by mouth daily.   Yes Historical Provider, MD  metFORMIN (GLUCOPHAGE) 500 MG tablet Take 500 mg by mouth 2 (two) times daily with a meal.   Yes Historical Provider, MD  potassium chloride (K-DUR,KLOR-CON) 10 MEQ tablet Take 10 mEq by mouth daily.   Yes Historical Provider, MD  promethazine (PHENERGAN) 25 MG suppository Place 25 mg rectally every 6 (six) hours as needed for nausea.   Yes Historical Provider, MD  promethazine (PHENERGAN) 25 MG tablet Take 25 mg by mouth every 6 (six) hours as needed for nausea.   Yes Historical Provider, MD  QUEtiapine (SEROQUEL) 100 MG tablet Take 100 mg by mouth at bedtime.   Yes Historical Provider, MD  traZODone (DESYREL) 50 MG tablet Take 50 mg by mouth at bedtime.   Yes Historical Provider, MD   Physical Exam: Filed Vitals:   07/20/13 1545 07/20/13 1555 07/20/13 1600 07/20/13 1615  BP: 114/39  111/46 121/47  Pulse: 65  67 63  Temp:  97.9 F (36.6 C)    TempSrc:      Resp: 14  16 15   SpO2: 100%  99% 100%     General exam: Moderately built and nourished pleasant elderly female patient, lying comfortably supine on the gurney in no obvious distress.  Head, eyes and ENT: Nontraumatic and normocephalic. Pupils equally reacting to light and  accommodation. Oral mucosa dry.  Neck: Supple. No JVD, carotid bruit or thyromegaly.  Lymphatics: No lymphadenopathy.  Respiratory system: Few coarse appearing basal crackles but otherwise clear to auscultation. No increased work of breathing.  Cardiovascular system: S1 and S2 heard, RRR. No JVD, murmurs, gallops, clicks or pedal edema.  Gastrointestinal system: Abdomen is nondistended, soft and nontender. Normal bowel sounds heard. No organomegaly or masses appreciated.  Central nervous system: Alert and oriented x2. Mild dysarthria and facial asymmetry.  Extremities: 5 x 5 power in right limbs. Grade 3 x 5 power left upper extremity and 2 x 5 power left lower extremity. Peripheral pulses symmetrically felt.   Skin: No rashes or acute findings.  Musculoskeletal system: Negative exam.  Psychiatry: Pleasant and cooperative.   Labs on Admission:  Basic Metabolic Panel:  Recent Labs Lab 07/20/13 1526 07/20/13 1552  NA 134* 133*  K 5.7* 5.5*  CL 94* 99  CO2 20  --   GLUCOSE 185* 186*  BUN 87* 97*  CREATININE 5.30* 6.20*  CALCIUM 9.6  --    Liver Function Tests:  Recent Labs Lab 07/20/13 1526  AST 17  ALT 13  ALKPHOS 38*  BILITOT 0.2*  PROT 6.6  ALBUMIN 2.9*   No results found for this basename: LIPASE, AMYLASE,  in the last 168 hours No results found for this basename: AMMONIA,  in the last 168 hours CBC:  Recent Labs Lab 07/20/13 1210 07/20/13 1552  WBC 6.4  --   NEUTROABS 3.8  --   HGB 8.8* 9.9*  HCT 27.0* 29.0*  MCV 90.9  --   PLT 213  --    Cardiac Enzymes: No results found for this basename: CKTOTAL, CKMB, CKMBINDEX, TROPONINI,  in the last 168 hours  BNP (last 3 results) No results found for this basename: PROBNP,  in the last 8760 hours CBG:  Recent Labs Lab 07/20/13 1200  GLUCAP 164*    Radiological Exams on Admission: Dg Chest 2 View  07/20/2013   CLINICAL DATA:  Aphasia and weakness with history of previous tobacco use  EXAM: CHEST   2 VIEW  COMPARISON:  DG CHEST 1V PORT dated 09/23/2011  FINDINGS: The lungs are reasonably well inflated for the sitting position. There is no focal infiltrate. There is no significant pleural effusion. There is no pneumothorax. The cardiac silhouette is top-normal in size. The pulmonary vascularity is not engorged. There is stable calcification of the pleura in the pulmonary apices.  IMPRESSION: There is no evidence of pneumonia. Mild prominence of the interstitial markings is present especially on the right which may reflect the patient's smoking history. There is no evidence of CHF.   Electronically Signed   By: David  Martinique   On: 07/20/2013 12:51   Ct Head Wo Contrast  07/20/2013   CLINICAL DATA:  Mental status change, weakness  EXAM: CT HEAD WITHOUT CONTRAST  TECHNIQUE: Contiguous axial images were obtained from the base of the skull through the vertex without intravenous contrast.  COMPARISON:  CT HEAD W/O CM dated 09/23/2011  FINDINGS: There is mild diffuse cerebral and cerebellar atrophy with compensatory ventriculomegaly. There is decreased density in the deep white matter of both cerebral hemispheres consistent with chronic small vessel ischemic change. There is no evidence of an acute ischemic or hemorrhagic infarction or evidence of other intracranial hemorrhagic process. The cerebellum and brainstem are normal in density.  At bone window settings there is chronic opacification of the left maxillary sinus. Erosive change of the bony medial maxillary sinus wall is again demonstrated and extension of soft tissue density material into the left nasal passage is present. The remainder the paranasal sinuses appear normal where visualized. The mastoid air cells are clear. There is no evidence of an acute skull fracture.  IMPRESSION: 1. There is no evidence of an acute intracranial hemorrhage nor of an evolving ischemic infarction. 2. There is mild diffuse cerebral and cerebellar atrophy with evidence of  chronic small vessel ischemic type change. 3. There is chronic abnormality of the left maxillary sinus. 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   Electronically Signed   By: David  Martinique   On: 07/20/2013 13:45    EKG: Independently reviewed. No sinus rhythm without acute changes.  Assessment/Plan Active Problems:   Renal failure, acute on chronic   Dehydration with hyponatremia   Anemia   Acute encephalopathy   Stroke   Hypertension  Diabetes type 2, uncontrolled   1. Acute encephalopathy: Likely secondary to significant metabolic derangement but CVA cannot be ruled out. Admit to telemetry. Treat dehydration and acute renal failure. Obtain MRI brain without contrast and if positive for stroke then proceed with further stroke workup. Patient passed bedside swallow screen. No aspirin started because of suspicion for CVA is low. For now hold sedative medications i.e. Depakote, Prozac, Neurontin, Ativan, Seroquel and trazodone which may be resumed once her mental status is consistently better. 2. Acute on chronic renal failure/hyperkalemia: Baseline creatinine not known. She had a single creatinine of 1.53 in May 2013 but normal creatinines in the days prior to that. This is secondary to dehydration and medications-lisinopril, losartan, meloxicam, Lasix and HCTZ. Hold the culprit medications. Hydrate with IV fluids and follow daily BMP. If creatinine is not rapidly improving, may consider renal ultrasound to rule out obstruction. Hyperkalemia should improve with hydration alone but will provide a dose of Kayexalate. No indications for urgent dialysis. 3. Dehydration with hyponatremia: Secondary to poor oral intake and diuretics. IV fluid hydration. 4. Uncontrolled type II DM: Will place on reduced dose of Lantus to avoid in-house hypoglycemia and NovoLog SSI. Hold metformin secondary to acute renal failure. 5. Hypertension: Soft blood pressures. Continue low dose amlodipine and hold lisinopril,  losartan, HCTZ and bisoprolol. 6. Anemia: Likely secondary to chronic kidney disease. No history of bleeding. Follow CBC in a.m. Transfuse if hemoglobin is less than 7 g per DL. FOBT negative. 7. 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 8. History of gout: Continue allopurinol     Code Status: Full  Family Communication: Discussed with patient's daughter and grandson at bedside.  Disposition Plan: Return to ALF when medically stable.   Time spent: 58 minutes  Obe Ahlers, MD, FACP, FHM. [facility] Pager [phone]  If 7PM-7AM, please contact night-coverage [email or web address] Password [email or web address] 07/20/2013, 5:02 PM

## 2013-07-20 NOTE — ED Notes (Signed)
Abigail, PA at the bedside.

## 2013-07-20 NOTE — ED Provider Notes (Signed)
CSN: [id number]     Arrival date & time 07/20/13  1147 History   None    Chief Complaint  Patient presents with  . Aphasia     (Consider location/radiation/quality/duration/timing/severity/associated sxs/prior Treatment) HPI  [name] Is an 80 year old female with past medical history of diabetes, hypertension, previous stroke with and left arm and leg hemi-plegia.  The patient is sent here from her assisted living facility for evaluation of her altered mental status.  According to patient's nursing staff she has been somnolent, hard to arouse, not participating in her activities of daily living today.  She they state that she is normally awake and alert.  They deny any recent trauma, fevers, symptoms of upper respiratory infection Patient is unable to provide history and unsure why she is here. Past Medical History  Diagnosis Date  . Diabetes mellitus   . Hypertension   . Gout   . Pneumonia   . Stroke     L sided deficits w/slurred speech  . Cancer 07/04/10    endometrial  . Falling episodes   . Hx of radiation therapy 09/13/10- 10/19/10, 6/12, 6/19, 11/14/10    pelvis, external beam tehn brachytherapy   Past Surgical History  Procedure Laterality Date  . Abdominal hysterectomy     No family history on file. History  Substance Use Topics  . Smoking status: Former Smoker    Types: Cigarettes  . Smokeless tobacco: Not on file     Comment: quit many yrs ago  . Alcohol Use: No   OB History   Grav Para Term Preterm Abortions TAB SAB Ect Mult Living                 Review of Systems  Unable to perform ROS: Dementia      Allergies  Penicillins  Home Medications   Current Outpatient Rx  Name  Route  Sig  Dispense  Refill  . ALPRAZolam (XANAX) 0.25 MG tablet   Oral   Take 0.25 mg by mouth at bedtime as needed for anxiety.          Marland Kitchen amLODipine (NORVASC) 5 MG tablet   Oral   Take 5 mg by mouth 2 (two) times daily.         . bisoprolol-hydrochlorothiazide  (ZIAC) 10-6.25 MG per tablet   Oral   Take 1 tablet by mouth 2 (two) times daily.         . carbidopa-levodopa (SINEMET IR) 25-100 MG per tablet   Oral   Take 1 tablet by mouth 4 (four) times daily.          . Cholecalciferol (VITAMIN D PO)   Oral   Take 2,000 Units by mouth daily.         . cloNIDine (CATAPRES) 0.3 MG tablet   Oral   Take 0.3 mg by mouth 2 (two) times daily.         . Cyanocobalamin (VITAMIN B 12 PO)   Oral   Take 1,000 mcg by mouth.         . divalproex (DEPAKOTE ER) 500 MG 24 hr tablet   Oral   Take 500 mg by mouth at bedtime.         . fish oil-omega-3 fatty acids 1000 MG capsule   Oral   Take 2 g by mouth daily.         Marland Kitchen FLUoxetine (PROZAC) 20 MG capsule   Oral   Take 20 mg by mouth daily.         Marland Kitchen  furosemide (LASIX) 20 MG tablet   Oral   Take 20 mg by mouth daily as needed for edema.          . furosemide (LASIX) 40 MG tablet   Oral   Take 40 mg by mouth every morning.         . Insulin Glargine (LANTUS SOLOSTAR) 100 UNIT/ML SOPN   Subcutaneous   Inject 38 Units into the skin every morning.         Marland Kitchen lisinopril (PRINIVIL,ZESTRIL) 20 MG tablet   Oral   Take 20 mg by mouth daily.         Marland Kitchen losartan (COZAAR) 100 MG tablet   Oral   Take 100 mg by mouth 2 (two) times daily.         . meloxicam (MOBIC) 15 MG tablet               . metFORMIN (GLUCOPHAGE) 500 MG tablet   Oral   Take 500 mg by mouth 2 (two) times daily with a meal.         . potassium chloride (K-DUR,KLOR-CON) 10 MEQ tablet   Oral   Take 10 mEq by mouth daily.         . promethazine (PHENERGAN) 25 MG suppository   Rectal   Place 25 mg rectally every 6 (six) hours as needed for nausea.         . promethazine (PHENERGAN) 25 MG tablet   Oral   Take 25 mg by mouth every 6 (six) hours as needed for nausea.         Marland Kitchen QUEtiapine Fumarate (SEROQUEL PO)   Oral   Take 150 mg by mouth at bedtime.         . traZODone (DESYREL) 50 MG  tablet   Oral   Take 50 mg by mouth at bedtime.          There were no vitals taken for this visit. Physical Exam  Nursing note and vitals reviewed. Constitutional: She appears well-developed and well-nourished. She appears lethargic. No distress.  HENT:  Head: Normocephalic.  Dry oral mucosa   Eyes:  Crusting of lashes  Neck:  Supraclavicular nodule Left  Cardiovascular: Normal rate.   Pulmonary/Chest: Effort normal and breath sounds normal.  Abdominal: Soft. There is no tenderness.  Neurological: She appears lethargic. A cranial nerve deficit is present. GCS eye subscore is 3. GCS verbal subscore is 4. GCS motor subscore is 5.    ED Course  Procedures (including critical care time) Labs Review Labs Reviewed - No data to display Imaging Review No results found.   EKG Interpretation   Date/Time:  Monday July 20 2013 12:01:24 EDT Ventricular Rate:  62 PR Interval:  212 QRS Duration: 80 QT Interval:  430 QTC Calculation: 437 R Axis:   5 Text Interpretation:  Sinus rhythm Borderline prolonged PR interval  Confirmed by ZAVITZ  MD, JOSHUA ([id number]) on 07/20/2013 12:46:14 PM      MDM   Final diagnoses:  None    1:08 PM BP 106/83  Pulse 65  Temp(Src) 97.7 F (36.5 C) (Oral)  Resp 16  SpO2 100% Patient with drop in her hgb 2 points since last year. Will get poct stool card. Fluids given.   2:56 PM Patient with negative fecal occult blood tests.  Her lab work shows anemia with a 2 g drop since last year, Depakote level as well.  Urine appears to be an infected.  A CT  scan is negative for acute abnormality.  Chest x-ray is also clear her EKG is sinus rhythm.  Spoken with her family who is now at bedside.  Her daughter states that 2 weeks ago she came to see her at the nursing home and noted that she was unable to support herself in a seated position.  Grandson also states that today she was unable to open her left eye.  On my examination I noticed facial weakness.   Daughter and grandson state that she did not have any facial weakness after her last stroke.  They state in fact that she did not have to have any speech therapy because her face was not involved.  I am concerned for a new stroke.  Patient has refused previously MRIs however I have spoken with her today and she states that she will be willing to submit to 1 with benzodiazepine and your plugs involved.  Patient does have altered mental status this and I will call for admission and a stroke rule out on the patient.  3:10 PM BP 124/40  Pulse 63  Temp(Src) 97.7 F (36.5 C) (Oral)  Resp 16  SpO2 100% Patient will be seen by Dr. Nicole Kindred in consult.  3:27 PM I work up blood chemistries were missed. I have ordered chem 8 and troponin. Dr. Algis Liming will admit to telemetry.   4:10 PM BP 124/40  Pulse 63  Temp(Src) 97.9 F (36.6 C) (Oral)  Resp 16  SpO2 100% Patient with AKI. Creatinine of 6.2, BUN 97. Second bolus given.  I have spoken with the family as well as Dr. Algis Liming. Ptient changed form obs to admission.  Margarita Mail, PA-C 07/20/13 1612

## 2013-07-20 NOTE — ED Notes (Addendum)
Pt sent from [facility], for slurred speech. Pt AxO x 3, disoriented to date, Pt denies pain. NSD. Pt appears lethargic but answers questions and responds to commands

## 2013-07-20 NOTE — ED Notes (Signed)
hospitalists at bedside

## 2013-07-20 NOTE — ED Notes (Signed)
Patient transported to X-ray 

## 2013-07-21 DIAGNOSIS — R197 Diarrhea, unspecified: Secondary | ICD-10-CM | POA: Diagnosis not present

## 2013-07-21 LAB — GLUCOSE, CAPILLARY
GLUCOSE-CAPILLARY: 166 mg/dL — AB (ref 70–99)
Glucose-Capillary: 161 mg/dL — ABNORMAL HIGH (ref 70–99)
Glucose-Capillary: 176 mg/dL — ABNORMAL HIGH (ref 70–99)
Glucose-Capillary: 164 mg/dL — ABNORMAL HIGH (ref 70–99)

## 2013-07-21 LAB — CBC
HEMATOCRIT: 36 % (ref 36.0–46.0)
HEMOGLOBIN: 11.3 g/dL — AB (ref 12.0–15.0)
MCH: 28.5 pg (ref 26.0–34.0)
MCHC: 31.4 g/dL (ref 30.0–36.0)
MCV: 90.7 fL (ref 78.0–100.0)
Platelets: 254 10*3/uL (ref 150–400)
RBC: 3.97 MIL/uL (ref 3.87–5.11)
RDW: 15.7 % — ABNORMAL HIGH (ref 11.5–15.5)
WBC: 7 10*3/uL (ref 4.0–10.5)

## 2013-07-21 LAB — LIPID PANEL
CHOL/HDL RATIO: 5.7 ratio
CHOLESTEROL: 199 mg/dL (ref 0–200)
HDL: 35 mg/dL — AB (ref >39)
LDL Cholesterol: UNDETERMINED mg/dL (ref 0–99)
Triglycerides: 457 mg/dL — ABNORMAL HIGH (ref <150)
VLDL: UNDETERMINED mg/dL (ref 0–40)

## 2013-07-21 LAB — BASIC METABOLIC PANEL
BUN: 72 mg/dL — AB (ref 6–23)
CHLORIDE: 100 meq/L (ref 96–112)
CO2: 24 meq/L (ref 19–32)
Calcium: 9.6 mg/dL (ref 8.4–10.5)
Creatinine, Ser: 3.58 mg/dL — ABNORMAL HIGH (ref 0.50–1.10)
GFR calc Af Amer: 13 mL/min — ABNORMAL LOW (ref >90)
GFR calc non Af Amer: 11 mL/min — ABNORMAL LOW (ref >90)
GLUCOSE: 114 mg/dL — AB (ref 70–99)
POTASSIUM: 4.9 meq/L (ref 3.7–5.3)
Sodium: 142 mEq/L (ref 137–147)

## 2013-07-21 LAB — HEMOGLOBIN A1C
Hgb A1c MFr Bld: 8 % — ABNORMAL HIGH (ref <5.7)
Mean Plasma Glucose: 183 mg/dL — ABNORMAL HIGH (ref <117)

## 2013-07-21 NOTE — Progress Notes (Addendum)
NEURO HOSPITALIST PROGRESS NOTE   SUBJECTIVE:                                                                                                                        Patient has no complaints and feeling well.   OBJECTIVE:                                                                                                                           Vital signs in last 24 hours: Temp:  [97.3 F (36.3 C)-98.6 F (37 C)] 98.6 F (37 C) (03/10 0922) Pulse Rate:  [63-80] 80 (03/10 0922) Resp:  [13-22] 20 (03/10 0922) BP: (106-189)/(39-88) 176/63 mmHg (03/10 0922) SpO2:  [92 %-100 %] 98 % (03/10 0922) Weight:  [57.5 kg (126 lb 12.2 oz)] 57.5 kg (126 lb 12.2 oz) (03/09 1915)  Intake/Output from previous day: 03/09 0701 - 03/10 0700 In: -  Out: 11 [Urine:11] Intake/Output this shift: Total I/O In: 240 [P.O.:240] Out: -  Nutritional status: Cardiac  Past Medical History  Diagnosis Date  . Diabetes mellitus   . Hypertension   . Gout   . Pneumonia   . Stroke     L sided deficits w/slurred speech  . Cancer 07/04/10    endometrial  . Falling episodes   . Hx of radiation therapy 09/13/10- 10/19/10, 6/12, 6/19, 11/14/10    pelvis, external beam tehn brachytherapy      Neurologic Exam:  Mental Status:  alert and oriented, following commands without difficulty. Speech is clear with no aphasia.  Cranial Nerves:  II: Discs flat bilaterally; Visual fields shows a right upper lateral quadranopia, pupils anisocoric with right > leftequal, round, reactive to light and accommodation  III,IV, VI: ptosis not noted today, extra-ocular motions intact bilaterally  V,VII: smile symmetric with left face appearing more full than right, facial light touch sensation normal bilaterally  VIII: hearing normal bilaterally  IX,X: gag reflex present  XI: bilateral shoulder shrug  XII: midline tongue extension without atrophy or fasciculations  Motor:  Right :  Upper  extremity 4+/5   Left:  Upper extremity 3/5 old   Lower extremity 4+/5   Lower extremity 62/5 old  Tone and bulk:normal tone throughout; no atrophy  noted  Sensory: Pinprick and light touch decreased on the left arm and leg (old)  Deep Tendon Reflexes:  2+ UE and 1+ in KJ no AJ Plantars:  Right: downgoing  Left: downgoing  Cerebellar:  normal finger-to-nose on the right, No ataxia noted on heel-to-shin test with right leg but patient had difficulty doing this       Lab Results: Basic Metabolic Panel:  Recent Labs Lab 07/20/13 1526 07/20/13 1552 07/20/13 2259 07/21/13 0502  NA 134* 133* 139 142  K 5.7* 5.5* 5.5* 4.9  CL 94* 99 98 100  CO2 20  --  24 24  GLUCOSE 185* 186* 110* 114*  BUN 87* 97* 82* 72*  CREATININE 5.30* 6.20* 4.50* 3.58*  CALCIUM 9.6  --  9.8 9.6    Liver Function Tests:  Recent Labs Lab 07/20/13 1526  AST 17  ALT 13  ALKPHOS 38*  BILITOT 0.2*  PROT 6.6  ALBUMIN 2.9*   No results found for this basename: LIPASE, AMYLASE,  in the last 168 hours No results found for this basename: AMMONIA,  in the last 168 hours  CBC:  Recent Labs Lab 07/20/13 1210 07/20/13 1552 07/21/13 0502  WBC 6.4  --  7.0  NEUTROABS 3.8  --   --   HGB 8.8* 9.9* 11.3*  HCT 27.0* 29.0* 36.0  MCV 90.9  --  90.7  PLT 213  --  254    Cardiac Enzymes: No results found for this basename: CKTOTAL, CKMB, CKMBINDEX, TROPONINI,  in the last 168 hours  Lipid Panel:  Recent Labs Lab 07/21/13 0502  CHOL 199  TRIG 457*  HDL 35*  CHOLHDL 5.7  VLDL UNABLE TO CALCULATE IF TRIGLYCERIDE OVER 400 mg/dL  LDLCALC UNABLE TO CALCULATE IF TRIGLYCERIDE OVER 400 mg/dL    CBG:  Recent Labs Lab 07/20/13 1200 07/20/13 2235 07/21/13 0657  GLUCAP 164* 99 161*    Microbiology: Results for orders placed during the hospital encounter of 08/29/10  URINE CULTURE     Status: None   Collection Time    08/29/10  3:13 PM      Result Value Ref Range Status   Specimen Description  URINE, CATHETERIZED   Final   Special Requests NONE   Final   Culture  Setup Time [id number]   Final   Colony Count NO GROWTH   Final   Culture NO GROWTH   Final   Report Status 08/30/2010 FINAL   Final    Coagulation Studies:  Recent Labs  07/20/13 1210  LABPROT 13.7  INR 1.07    Imaging: Dg Chest 2 View  07/20/2013   CLINICAL DATA:  Aphasia and weakness with history of previous tobacco use  EXAM: CHEST  2 VIEW  COMPARISON:  DG CHEST 1V PORT dated 09/23/2011  FINDINGS: The lungs are reasonably well inflated for the sitting position. There is no focal infiltrate. There is no significant pleural effusion. There is no pneumothorax. The cardiac silhouette is top-normal in size. The pulmonary vascularity is not engorged. There is stable calcification of the pleura in the pulmonary apices.  IMPRESSION: There is no evidence of pneumonia. Mild prominence of the interstitial markings is present especially on the right which may reflect the patient's smoking history. There is no evidence of CHF.   Electronically Signed   By: David  Martinique   On: 07/20/2013 12:51   Ct Head Wo Contrast  07/20/2013   CLINICAL DATA:  Mental status change, weakness  EXAM: CT HEAD WITHOUT  CONTRAST  TECHNIQUE: Contiguous axial images were obtained from the base of the skull through the vertex without intravenous contrast.  COMPARISON:  CT HEAD W/O CM dated 09/23/2011  FINDINGS: There is mild diffuse cerebral and cerebellar atrophy with compensatory ventriculomegaly. There is decreased density in the deep white matter of both cerebral hemispheres consistent with chronic small vessel ischemic change. There is no evidence of an acute ischemic or hemorrhagic infarction or evidence of other intracranial hemorrhagic process. The cerebellum and brainstem are normal in density.  At bone window settings there is chronic opacification of the left maxillary sinus. Erosive change of the bony medial maxillary sinus wall is again demonstrated  and extension of soft tissue density material into the left nasal passage is present. The remainder the paranasal sinuses appear normal where visualized. The mastoid air cells are clear. There is no evidence of an acute skull fracture.  IMPRESSION: 1. There is no evidence of an acute intracranial hemorrhage nor of an evolving ischemic infarction. 2. There is mild diffuse cerebral and cerebellar atrophy with evidence of chronic small vessel ischemic type change. 3. There is chronic abnormality of the left maxillary sinus. 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   Electronically Signed   By: David  Martinique   On: 07/20/2013 13:45   Mr Brain Wo Contrast  07/20/2013   CLINICAL DATA:  Stroke.  Diabetes and hypertension  EXAM: MRI HEAD WITHOUT CONTRAST  TECHNIQUE: Multiplanar, multiecho pulse sequences of the brain and surrounding structures were obtained without intravenous contrast.  COMPARISON:  CT head 07/20/2013  FINDINGS: Moderate atrophy. Negative for hydrocephalus. Moderate chronic changes throughout the cerebral white matter similar to the prior study. Mild hyperintensity in the right pons. Diffuse white matter changes are similar to the MRI of 08/30/2010  Negative for acute infarct. Negative for hemorrhage or mass. No shift of the midline structures.  Mild mucosal edema in the paranasal sinuses.  IMPRESSION: Atrophy and chronic microvascular ischemic changes in the white matter. No acute abnormality   Electronically Signed   By: Franchot Gallo M.D.   On: 07/20/2013 17:48       MEDICATIONS                                                                                                                        Scheduled: . allopurinol  150 mg Oral QHS  . amLODipine  5 mg Oral Daily  . aspirin  300 mg Rectal Daily   Or  . aspirin  325 mg Oral Daily  . cholecalciferol  8,000 Units Oral Daily  . enoxaparin (LOVENOX) injection  30 mg Subcutaneous Q24H  . insulin aspart  0-5 Units Subcutaneous QHS  .  insulin aspart  0-9 Units Subcutaneous TID WC  . insulin glargine  30 Units Subcutaneous Daily  . omega-3 acid ethyl esters  1 g Oral BID  . polyvinyl alcohol  1 drop Both Eyes BID    ASSESSMENT/PLAN:  80 YO female presenting to hospital due to increased somnolence noted by nursing staff.  Cr. Was 6.3 which has improved to 3.8 with hydration and K- of 5.7 which is now 4.9.  Today patient is more alert, talkative and shows no facial droop or ptosis.  MRI was negative for acute infarct. Likely peeling the onion effect of old stroke symptoms.   Recommend: 1) Continue to treat underlying dehydration and metabolic issues.   2) PT 3) Continue ASA daily  No further recommendations.     Assessment and plan discussed with with attending physician and they are in agreement.    Etta Quill PA-C [facility] [phone]  07/21/2013, 11:55 AM

## 2013-07-21 NOTE — Progress Notes (Signed)
   CARE MANAGEMENT NOTE 07/21/2013  Patient:  [name], [name]   Account Number:  [id number]  Date Initiated:  07/21/2013  Documentation initiated by:  Olga Coaster  Subjective/Objective Assessment:   ADMITTED WITH ALTERED MENTAL STATUS     Action/Plan:   PATIENT RESIDES IN NURSING FACILITY; [facility] WORKER REFERRAL PLACED   Anticipated DC Date:  07/24/2013   Anticipated DC Plan:  SKILLED NURSING FACILITY  In-house referral  Clinical Social Worker      DC Planning Services  CM consult          Status of service:  In process, will continue to follow Medicare Important Message given?  NA - LOS <3 / Initial given by admissions (If response is "NO", the following Medicare IM given date fields will be blank)  Per UR Regulation:  Reviewed for med. necessity/level of care/duration of stay  Comments:  3/10/2015Mindi Slicker RN,BSN,MHA [id number]

## 2013-07-21 NOTE — Progress Notes (Signed)
OT Cancellation Note  Patient Details Name: [name] MRN: [id number] DOB: 08/16/1928   Cancelled Treatment:    Reason Eval/Treat Not Completed: OT screened, no needs identified, will sign off. Spoke with PT who is recommending SNF for rehab. All further needs can be met at next venue of care.    Benito Mccreedy OTR/L [phone] 07/21/2013, 9:49 AM

## 2013-07-21 NOTE — Progress Notes (Signed)
Subjective: Patient is comfortable and alert. Recognizes me and calls me by name. Complaining of loose stool after Kayexalate last night for elevated potassium  Objective: Weight change:   Intake/Output Summary (Last 24 hours) at 07/21/13 0708 Last data filed at 07/20/13 1417  Gross per 24 hour  Intake      0 ml  Output     11 ml  Net    -11 ml   Filed Vitals:   07/20/13 2324 07/21/13 0130 07/21/13 0330 07/21/13 0530  BP: 156/77 148/70 167/67 151/88  Pulse: 70 74 80 80  Temp: 97.7 F (36.5 C) 97.7 F (36.5 C) 97.5 F (36.4 C) 98.2 F (36.8 C)  TempSrc: Oral Oral Oral Oral  Resp: '18 16 18 20  ' Height:      Weight:      SpO2: 94% 94% 95% 93%    General Appearance: Alert, cooperative, no distress, appears stated age Lungs: Clear to auscultation bilaterally, respirations unlabored Heart: Regular rate and rhythm, S1 and S2 normal, no murmur, rub or gallop Abdomen: Soft, non-tender, bowel sounds active all four quadrants, no masses, no organomegaly Extremities: Extremities normal, atraumatic, no cyanosis or edema Neuro: Oriented x3, persistent left-sided weakness, chronic  Lab Results: Results for orders placed during the hospital encounter of 07/20/13 (from the past 48 hour(s))  CBG MONITORING, ED     Status: Abnormal   Collection Time    07/20/13 12:00 PM      Result Value Ref Range   Glucose-Capillary 164 (*) 70 - 99 mg/dL   Comment 1 Documented in Chart     Comment 2 Notify RN    CBC WITH DIFFERENTIAL     Status: Abnormal   Collection Time    07/20/13 12:10 PM      Result Value Ref Range   WBC 6.4  4.0 - 10.5 K/uL   RBC 2.97 (*) 3.87 - 5.11 MIL/uL   Hemoglobin 8.8 (*) 12.0 - 15.0 g/dL   HCT 27.0 (*) 36.0 - 46.0 %   MCV 90.9  78.0 - 100.0 fL   MCH 29.6  26.0 - 34.0 pg   MCHC 32.6  30.0 - 36.0 g/dL   RDW 15.6 (*) 11.5 - 15.5 %   Platelets 213  150 - 400 K/uL   Neutrophils Relative % 59  43 - 77 %   Neutro Abs 3.8  1.7 - 7.7 K/uL   Lymphocytes Relative 27  12 -  46 %   Lymphs Abs 1.7  0.7 - 4.0 K/uL   Monocytes Relative 9  3 - 12 %   Monocytes Absolute 0.6  0.1 - 1.0 K/uL   Eosinophils Relative 5  0 - 5 %   Eosinophils Absolute 0.3  0.0 - 0.7 K/uL   Basophils Relative 1  0 - 1 %   Basophils Absolute 0.0  0.0 - 0.1 K/uL  VALPROIC ACID LEVEL     Status: Abnormal   Collection Time    07/20/13 12:10 PM      Result Value Ref Range   Valproic Acid Lvl 28.1 (*) 50.0 - 100.0 ug/mL  PROTIME-INR     Status: None   Collection Time    07/20/13 12:10 PM      Result Value Ref Range   Prothrombin Time 13.7  11.6 - 15.2 seconds   INR 1.07  0.00 - 1.49  APTT     Status: None   Collection Time    07/20/13 12:10 PM  Result Value Ref Range   aPTT 37  24 - 37 seconds   Comment:            IF BASELINE aPTT IS ELEVATED,     SUGGEST PATIENT RISK ASSESSMENT     BE USED TO DETERMINE APPROPRIATE     ANTICOAGULANT THERAPY.  POC OCCULT BLOOD, ED     Status: None   Collection Time    07/20/13  2:07 PM      Result Value Ref Range   Fecal Occult Bld NEGATIVE  NEGATIVE  URINALYSIS, ROUTINE W REFLEX MICROSCOPIC     Status: Abnormal   Collection Time    07/20/13  2:17 PM      Result Value Ref Range   Color, Urine YELLOW  YELLOW   APPearance CLEAR  CLEAR   Specific Gravity, Urine 1.019  1.005 - 1.030   pH 5.0  5.0 - 8.0   Glucose, UA NEGATIVE  NEGATIVE mg/dL   Hgb urine dipstick NEGATIVE  NEGATIVE   Bilirubin Urine SMALL (*) NEGATIVE   Ketones, ur NEGATIVE  NEGATIVE mg/dL   Protein, ur NEGATIVE  NEGATIVE mg/dL   Urobilinogen, UA 0.2  0.0 - 1.0 mg/dL   Nitrite NEGATIVE  NEGATIVE   Leukocytes, UA NEGATIVE  NEGATIVE   Comment: MICROSCOPIC NOT DONE ON URINES WITH NEGATIVE PROTEIN, BLOOD, LEUKOCYTES, NITRITE, OR GLUCOSE <1000 mg/dL.  COMPREHENSIVE METABOLIC PANEL     Status: Abnormal   Collection Time    07/20/13  3:26 PM      Result Value Ref Range   Sodium 134 (*) 137 - 147 mEq/L   Potassium 5.7 (*) 3.7 - 5.3 mEq/L   Chloride 94 (*) 96 - 112 mEq/L    CO2 20  19 - 32 mEq/L   Glucose, Bld 185 (*) 70 - 99 mg/dL   BUN 87 (*) 6 - 23 mg/dL   Creatinine, Ser 5.30 (*) 0.50 - 1.10 mg/dL   Calcium 9.6  8.4 - 10.5 mg/dL   Total Protein 6.6  6.0 - 8.3 g/dL   Albumin 2.9 (*) 3.5 - 5.2 g/dL   AST 17  0 - 37 U/L   ALT 13  0 - 35 U/L   Alkaline Phosphatase 38 (*) 39 - 117 U/L   Total Bilirubin 0.2 (*) 0.3 - 1.2 mg/dL   GFR calc non Af Amer 7 (*) >90 mL/min   GFR calc Af Amer 8 (*) >90 mL/min   Comment: (NOTE)     The eGFR has been calculated using the CKD EPI equation.     This calculation has not been validated in all clinical situations.     eGFR's persistently <90 mL/min signify possible Chronic Kidney     Disease.  Randolm Idol, ED     Status: None   Collection Time    07/20/13  3:52 PM      Result Value Ref Range   Troponin i, poc 0.01  0.00 - 0.08 ng/mL   Comment 3            Comment: Due to the release kinetics of cTnI,     a negative result within the first hours     of the onset of symptoms does not rule out     myocardial infarction with certainty.     If myocardial infarction is still suspected,     repeat the test at appropriate intervals.  I-STAT CHEM 8, ED     Status: Abnormal   Collection Time  07/20/13  3:52 PM      Result Value Ref Range   Sodium 133 (*) 137 - 147 mEq/L   Potassium 5.5 (*) 3.7 - 5.3 mEq/L   Chloride 99  96 - 112 mEq/L   BUN 97 (*) 6 - 23 mg/dL   Creatinine, Ser 6.20 (*) 0.50 - 1.10 mg/dL   Glucose, Bld 186 (*) 70 - 99 mg/dL   Calcium, Ion 1.21  1.13 - 1.30 mmol/L   TCO2 23  0 - 100 mmol/L   Hemoglobin 9.9 (*) 12.0 - 15.0 g/dL   HCT 29.0 (*) 36.0 - 46.0 %  GLUCOSE, CAPILLARY     Status: None   Collection Time    07/20/13 10:35 PM      Result Value Ref Range   Glucose-Capillary 99  70 - 99 mg/dL   Comment 1 Documented in Chart     Comment 2 Notify RN    BASIC METABOLIC PANEL     Status: Abnormal   Collection Time    07/20/13 10:59 PM      Result Value Ref Range   Sodium 139  137 - 147  mEq/L   Potassium 5.5 (*) 3.7 - 5.3 mEq/L   Chloride 98  96 - 112 mEq/L   CO2 24  19 - 32 mEq/L   Glucose, Bld 110 (*) 70 - 99 mg/dL   BUN 82 (*) 6 - 23 mg/dL   Creatinine, Ser 4.50 (*) 0.50 - 1.10 mg/dL   Calcium 9.8  8.4 - 10.5 mg/dL   GFR calc non Af Amer 8 (*) >90 mL/min   GFR calc Af Amer 10 (*) >90 mL/min   Comment: (NOTE)     The eGFR has been calculated using the CKD EPI equation.     This calculation has not been validated in all clinical situations.     eGFR's persistently <90 mL/min signify possible Chronic Kidney     Disease.  CBC     Status: Abnormal   Collection Time    07/21/13  5:02 AM      Result Value Ref Range   WBC 7.0  4.0 - 10.5 K/uL   RBC 3.97  3.87 - 5.11 MIL/uL   Hemoglobin 11.3 (*) 12.0 - 15.0 g/dL   HCT 36.0  36.0 - 46.0 %   MCV 90.7  78.0 - 100.0 fL   MCH 28.5  26.0 - 34.0 pg   MCHC 31.4  30.0 - 36.0 g/dL   RDW 15.7 (*) 11.5 - 15.5 %   Platelets 254  150 - 400 K/uL  BASIC METABOLIC PANEL     Status: Abnormal   Collection Time    07/21/13  5:02 AM      Result Value Ref Range   Sodium 142  137 - 147 mEq/L   Potassium 4.9  3.7 - 5.3 mEq/L   Chloride 100  96 - 112 mEq/L   CO2 24  19 - 32 mEq/L   Glucose, Bld 114 (*) 70 - 99 mg/dL   BUN 72 (*) 6 - 23 mg/dL   Creatinine, Ser 3.58 (*) 0.50 - 1.10 mg/dL   Calcium 9.6  8.4 - 10.5 mg/dL   GFR calc non Af Amer 11 (*) >90 mL/min   GFR calc Af Amer 13 (*) >90 mL/min   Comment: (NOTE)     The eGFR has been calculated using the CKD EPI equation.     This calculation has not been validated in all clinical situations.  eGFR's persistently <90 mL/min signify possible Chronic Kidney     Disease.  LIPID PANEL     Status: Abnormal   Collection Time    07/21/13  5:02 AM      Result Value Ref Range   Cholesterol 199  0 - 200 mg/dL   Triglycerides 457 (*) <150 mg/dL   HDL 35 (*) >39 mg/dL   Total CHOL/HDL Ratio 5.7     VLDL UNABLE TO CALCULATE IF TRIGLYCERIDE OVER 400 mg/dL  0 - 40 mg/dL   LDL  Cholesterol UNABLE TO CALCULATE IF TRIGLYCERIDE OVER 400 mg/dL  0 - 99 mg/dL   Comment:            Total Cholesterol/HDL:CHD Risk     Coronary Heart Disease Risk Table                         Men   Women      1/2 Average Risk   3.4   3.3      Average Risk       5.0   4.4      2 X Average Risk   9.6   7.1      3 X Average Risk  23.4   11.0                Use the calculated Patient Ratio     above and the CHD Risk Table     to determine the patient's CHD Risk.                ATP III CLASSIFICATION (LDL):      <100     mg/dL   Optimal      100-129  mg/dL   Near or Above                        Optimal      130-159  mg/dL   Borderline      160-189  mg/dL   High      >190     mg/dL   Very High    Studies/Results: Dg Chest 2 View  07/20/2013   CLINICAL DATA:  Aphasia and weakness with history of previous tobacco use  EXAM: CHEST  2 VIEW  COMPARISON:  DG CHEST 1V PORT dated 09/23/2011  FINDINGS: The lungs are reasonably well inflated for the sitting position. There is no focal infiltrate. There is no significant pleural effusion. There is no pneumothorax. The cardiac silhouette is top-normal in size. The pulmonary vascularity is not engorged. There is stable calcification of the pleura in the pulmonary apices.  IMPRESSION: There is no evidence of pneumonia. Mild prominence of the interstitial markings is present especially on the right which may reflect the patient's smoking history. There is no evidence of CHF.   Electronically Signed   By: David  Martinique   On: 07/20/2013 12:51   Ct Head Wo Contrast  07/20/2013   CLINICAL DATA:  Mental status change, weakness  EXAM: CT HEAD WITHOUT CONTRAST  TECHNIQUE: Contiguous axial images were obtained from the base of the skull through the vertex without intravenous contrast.  COMPARISON:  CT HEAD W/O CM dated 09/23/2011  FINDINGS: There is mild diffuse cerebral and cerebellar atrophy with compensatory ventriculomegaly. There is decreased density in the deep  white matter of both cerebral hemispheres consistent with chronic small vessel ischemic change. There is no evidence of an acute ischemic or  hemorrhagic infarction or evidence of other intracranial hemorrhagic process. The cerebellum and brainstem are normal in density.  At bone window settings there is chronic opacification of the left maxillary sinus. Erosive change of the bony medial maxillary sinus wall is again demonstrated and extension of soft tissue density material into the left nasal passage is present. The remainder the paranasal sinuses appear normal where visualized. The mastoid air cells are clear. There is no evidence of an acute skull fracture.  IMPRESSION: 1. There is no evidence of an acute intracranial hemorrhage nor of an evolving ischemic infarction. 2. There is mild diffuse cerebral and cerebellar atrophy with evidence of chronic small vessel ischemic type change. 3. There is chronic abnormality of the left maxillary sinus. 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   Electronically Signed   By: David  Martinique   On: 07/20/2013 13:45   Mr Brain Wo Contrast  07/20/2013   CLINICAL DATA:  Stroke.  Diabetes and hypertension  EXAM: MRI HEAD WITHOUT CONTRAST  TECHNIQUE: Multiplanar, multiecho pulse sequences of the brain and surrounding structures were obtained without intravenous contrast.  COMPARISON:  CT head 07/20/2013  FINDINGS: Moderate atrophy. Negative for hydrocephalus. Moderate chronic changes throughout the cerebral white matter similar to the prior study. Mild hyperintensity in the right pons. Diffuse white matter changes are similar to the MRI of 08/30/2010  Negative for acute infarct. Negative for hemorrhage or mass. No shift of the midline structures.  Mild mucosal edema in the paranasal sinuses.  IMPRESSION: Atrophy and chronic microvascular ischemic changes in the white matter. No acute abnormality   Electronically Signed   By: Franchot Gallo M.D.   On: 07/20/2013 17:48    Medications: Scheduled Meds: . allopurinol  150 mg Oral QHS  . amLODipine  5 mg Oral Daily  . aspirin  300 mg Rectal Daily   Or  . aspirin  325 mg Oral Daily  . cholecalciferol  8,000 Units Oral Daily  . enoxaparin (LOVENOX) injection  30 mg Subcutaneous Q24H  . insulin aspart  0-5 Units Subcutaneous QHS  . insulin aspart  0-9 Units Subcutaneous TID WC  . insulin glargine  30 Units Subcutaneous Daily  . omega-3 acid ethyl esters  1 g Oral BID  . polyvinyl alcohol  1 drop Both Eyes BID   Continuous Infusions: . sodium chloride 125 mL/hr at 07/21/13 0051   PRN Meds:.acetaminophen, acetaminophen, senna-docusate  Assessment/Plan: Active Problems:   Renal failure, acute on chronic   Dehydration with hyponatremia   Anemia   Acute encephalopathy   Stroke   Hypertension   Diabetes type 2, uncontrolled   Diarrhea  1. Acute encephalopathy: Improving with treatment of acute renal failure. MRI of the brain without acute stroke. Patient passed bedside swallow screen. For now continue to hold sedative medications i.e. Depakote, Prozac, Neurontin, Ativan, Seroquel and trazodone which may be resumed once her mental status is consistently better. 2. Acute on chronic renal failure/hyperkalemia: Baseline creatinine is 1.3-1.5. Acute renal failure is most likely secondary to dehydration and medications-lisinopril, losartan, meloxicam, Lasix and HCTZ. Hold the culprit medications. Continue to hydrate with IV fluids and follow daily BMP. If creatinine is not rapidly improving, may consider renal ultrasound to rule out obstruction. Hyperkalemia improved with hydration alone but will use Kayexalate as needed. No indications for urgent dialysis. 3. Dehydration with hyponatremia: Secondary to poor oral intake and diuretics. IV fluid hydration. Clinically better hydrated with moist mucous membranes and well-hydrated tongue 4. Uncontrolled type II DM: Continue reduced  dose of Lantus to avoid in-house  hypoglycemia and NovoLog SSI. Hold metformin secondary to acute renal failure. 5. Hypertension: Soft blood pressures. Continue low dose amlodipine and continue to hold lisinopril, losartan, HCTZ and bisoprolol. 6. Anemia: Likely secondary to chronic kidney disease. No history of bleeding. CBC stable. Transfuse if hemoglobin is less than 7 g per DL. FOBT negative. 7. 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 8. History of gout: Continue allopurinol   LOS: 1 day   Henrine Screws, MD 07/21/2013, 7:08 AM

## 2013-07-21 NOTE — Evaluation (Signed)
Physical Therapy Evaluation Patient Details Name: MYSTERY VILLATORO MRN: [id number] DOB: [birth year] Today's Date: 07/21/2013 Time: [id number] PT Time Calculation (min): 47 min  PT Assessment / Plan / Recommendation History of Present Illness  pt presents with Renal Failure and Encephalopathy with hx of CVA with L Hemi.    Clinical Impression  Pt soaked in bed and having BM.  Pt required total A for hygiene in bed.  Pt again needing to use 3-in-1 and required 2nd person for transfers and to perform peri hygiene.  Pt states it has been taking 1-2 people to A her at [facility].  Feel that pt may be more appropriate for SNF level of care rather than returning to ALF.  Will continue to follow to work on transfers while on acute.      PT Assessment  Patient needs continued PT services    Follow Up Recommendations  SNF    Does the patient have the potential to tolerate intense rehabilitation      Barriers to Discharge        Equipment Recommendations  None recommended by PT    Recommendations for Other Services     Frequency Min 2X/week    Precautions / Restrictions Precautions Precautions: Fall Restrictions Weight Bearing Restrictions: No   Pertinent Vitals/Pain Only indicated hemorrhoid pain during peri hygiene.  RN aware.        Mobility  Bed Mobility Overal bed mobility: Needs Assistance Bed Mobility: Rolling;Sidelying to Sit Rolling: Max assist Sidelying to sit: Max assist General bed mobility comments: pt attempts to A with R UE on bed rail.   Transfers Overall transfer level: Needs assistance Equipment used: 1 person hand held assist;2 person hand held assist Transfers: Sit to/from [facility] Sit to Stand: Max assist Stand pivot transfers: Max assist;+2 physical assistance General transfer comment: pt attempts to use R UE and LE to A with mobility, but globally weak.  2nd person needed for pivot to 3-in-1 and to recliner.      Exercises     PT  Diagnosis: Generalized weakness  PT Problem List: Decreased strength;Decreased activity tolerance;Decreased balance;Decreased mobility;Decreased coordination;Decreased cognition;Impaired tone PT Treatment Interventions: Functional mobility training;Therapeutic activities;Therapeutic exercise;Balance training;Neuromuscular re-education;Patient/family education;Cognitive remediation     PT Goals(Current goals can be found in the care plan section) Acute Rehab PT Goals Patient Stated Goal: None stated.   PT Goal Formulation: With patient Time For Goal Achievement: 08/04/13 Potential to Achieve Goals: Good  Visit Information  Last PT Received On: 07/21/13 Assistance Needed: +2 History of Present Illness: pt presents with Renal Failure and Encephalopathy with hx of CVA with L Hemi.         Prior [place] expects to be discharged to:: Skilled nursing facility Additional Comments: pt from [facility], but needs SNF level of care at D/C.   Prior Function Level of Independence: Needs assistance Gait / Transfers Assistance Needed: 1-2 person A for squat pivot to W/C.   ADL's / Homemaking Assistance Needed: Staff A with all ADLs.   Communication Communication: No difficulties    Cognition  Cognition Arousal/Alertness: Awake/alert Behavior During Therapy: WFL for tasks assessed/performed Overall Cognitive Status: History of cognitive impairments - at baseline    Extremity/Trunk Assessment Upper Extremity Assessment Upper Extremity Assessment: LUE deficits/detail LUE Deficits / Details: Increased flexion tone from previous CVA, no active movement.   Lower Extremity Assessment Lower Extremity Assessment: Generalized weakness;LLE deficits/detail LLE Deficits / Details: Increased flexor tone from old  CVA.  No active movement.     Balance Balance Overall balance assessment: Needs assistance Sitting-balance support: Single extremity supported;Feet  unsupported Sitting balance-Leahy Scale: Poor Postural control: Posterior lean Standing balance support: Single extremity supported Standing balance-Leahy Scale: Zero  End of Session PT - End of Session Equipment Utilized During Treatment: Gait belt Activity Tolerance: Patient tolerated treatment well Patient left: in chair;with call bell/phone within reach;with chair alarm set Nurse Communication: Mobility status  GP Functional Assessment Tool Used: Clinical Judgement Functional Limitation: Mobility: Walking and moving around Mobility: Walking and Moving Around Current Status [id number]): At least 80 percent but less than 100 percent impaired, limited or restricted Mobility: Walking and Moving Around Goal Status [phone]): At least 20 percent but less than 40 percent impaired, limited or restricted   Catarina Hartshorn, [place] 07/21/2013, 9:54 AM

## 2013-07-21 NOTE — Clinical Social Work Psychosocial (Signed)
Clinical Social Work Department BRIEF PSYCHOSOCIAL ASSESSMENT 07/21/2013  Patient:  [name], [name]     Account Number:  [id number]     Admit date:  07/20/2013  Clinical Social Worker:  Donna Christen  Date/Time:  07/21/2013 04:00 PM  Referred by:  Physician  Date Referred:  07/21/2013 Referred for  SNF Placement   Other Referral:   none.   Interview type:  Patient Other interview type:   none.    PSYCHOSOCIAL DATA Living Status:  FACILITY Admitted from facility:  [place] Level of care:  Assisted Living Primary support name:  [name] and [name] Primary support relationship to patient:  CHILD, ADULT Degree of support available:   Pt reports having strong relationship with both of her children.    CURRENT CONCERNS Current Concerns  Post-Acute Placement   Other Concerns:   none.    SOCIAL WORK ASSESSMENT / PLAN CSW met with pt at bedside to discuss discharge disposition. CSW defined CSW role with [facility]. Pt stated that she was agreeable to speaking with CSW regarding discharge disposition. CSW informed pt of PT recommendation for SNF placement, and asked pt what her thoughts were on SNF placement for short-term rehabilitation. Pt stated that she did NOT want to be placed in SNF and would prefer to return to her ALF ([facility] ) at time of discharge. CSW informed pt of need to make sure pt is safe and able to return to ALF with her current medical state. Pt insistant that she is "fine" to return to ALF. CSW to continue to follow and assist with discharge planning needs.   Assessment/plan status:  Psychosocial Support/Ongoing Assessment of Needs Other assessment/ plan:   none.   Information/referral to community resources:   Pt wishes to return to ALF and would not allow CSW to fax clinical infomation to [facility].    PATIENTS/FAMILYS RESPONSE TO PLAN OF CARE: Pt is insistant that she is returning to ALF at time of discharge.  CSW to contact ALF.     Pati Gallo, [place] Social Worker [phone]

## 2013-07-22 ENCOUNTER — Inpatient Hospital Stay (HOSPITAL_COMMUNITY): Payer: Medicare Other

## 2013-07-22 LAB — BASIC METABOLIC PANEL
BUN: 49 mg/dL — ABNORMAL HIGH (ref 6–23)
CALCIUM: 10.1 mg/dL (ref 8.4–10.5)
CO2: 25 meq/L (ref 19–32)
Chloride: 98 mEq/L (ref 96–112)
Creatinine, Ser: 1.81 mg/dL — ABNORMAL HIGH (ref 0.50–1.10)
GFR calc Af Amer: 29 mL/min — ABNORMAL LOW (ref >90)
GFR calc non Af Amer: 25 mL/min — ABNORMAL LOW (ref >90)
GLUCOSE: 214 mg/dL — AB (ref 70–99)
Potassium: 3.9 mEq/L (ref 3.7–5.3)
SODIUM: 140 meq/L (ref 137–147)

## 2013-07-22 LAB — CBC WITH DIFFERENTIAL/PLATELET
BASOS ABS: 0 10*3/uL (ref 0.0–0.1)
Basophils Relative: 0 % (ref 0–1)
Eosinophils Absolute: 0.2 10*3/uL (ref 0.0–0.7)
Eosinophils Relative: 2 % (ref 0–5)
HEMATOCRIT: 34.8 % — AB (ref 36.0–46.0)
Hemoglobin: 11.2 g/dL — ABNORMAL LOW (ref 12.0–15.0)
LYMPHS PCT: 21 % (ref 12–46)
Lymphs Abs: 2.8 10*3/uL (ref 0.7–4.0)
MCH: 29 pg (ref 26.0–34.0)
MCHC: 32.2 g/dL (ref 30.0–36.0)
MCV: 90.2 fL (ref 78.0–100.0)
Monocytes Absolute: 1.1 10*3/uL — ABNORMAL HIGH (ref 0.1–1.0)
Monocytes Relative: 8 % (ref 3–12)
NEUTROS ABS: 9.1 10*3/uL — AB (ref 1.7–7.7)
Neutrophils Relative %: 69 % (ref 43–77)
PLATELETS: 376 10*3/uL (ref 150–400)
RBC: 3.86 MIL/uL — ABNORMAL LOW (ref 3.87–5.11)
RDW: 16 % — AB (ref 11.5–15.5)
WBC: 13.2 10*3/uL — AB (ref 4.0–10.5)

## 2013-07-22 LAB — GLUCOSE, CAPILLARY
GLUCOSE-CAPILLARY: 204 mg/dL — AB (ref 70–99)
GLUCOSE-CAPILLARY: 257 mg/dL — AB (ref 70–99)
GLUCOSE-CAPILLARY: 208 mg/dL — AB (ref 70–99)
Glucose-Capillary: 243 mg/dL — ABNORMAL HIGH (ref 70–99)

## 2013-07-22 MED ORDER — TRAZODONE HCL 50 MG PO TABS
50.0000 mg | ORAL_TABLET | Freq: Every day | ORAL | Status: DC
  Administered 2013-07-22 – 2013-07-23 (×2): 50 mg via ORAL
  Filled 2013-07-22 (×3): qty 1

## 2013-07-22 MED ORDER — BISOPROLOL FUMARATE 10 MG PO TABS
10.0000 mg | ORAL_TABLET | Freq: Every day | ORAL | Status: DC
  Administered 2013-07-22 – 2013-07-24 (×3): 10 mg via ORAL
  Filled 2013-07-22 (×3): qty 1

## 2013-07-22 MED ORDER — CLONIDINE HCL 0.1 MG PO TABS
0.1000 mg | ORAL_TABLET | Freq: Two times a day (BID) | ORAL | Status: DC
  Administered 2013-07-22 – 2013-07-24 (×5): 0.1 mg via ORAL
  Filled 2013-07-22 (×7): qty 1

## 2013-07-22 NOTE — Progress Notes (Addendum)
Subjective: Patient did not sleep last night and experienced some hallucinations. She is off of her trazodone and Seroquel. We'll resume both of those. Renal function from this morning still pending. Blood pressure is up some and will resume clonidine and continue amlodipine. Feels well. Conversive and clear this morning and able to negotiate her breakfast.  Objective: Weight change:   Intake/Output Summary (Last 24 hours) at 07/22/13 0734 Last data filed at 07/21/13 0826  Gross per 24 hour  Intake    240 ml  Output      0 ml  Net    240 ml   Filed Vitals:   07/21/13 1700 07/21/13 2142 07/22/13 0206 07/22/13 0541  BP: 178/72 176/44 166/79 139/101  Pulse: 76 81 74 81  Temp: 97.8 F (36.6 C) 98.5 F (36.9 C) 98.1 F (36.7 C) 97.7 F (36.5 C)  TempSrc: Oral Oral Oral Oral  Resp: _0 Height:      Weight:      SpO2: 97% 94% 95% 96%    General Appearance: Alert, cooperative, no distress, appears stated age  Lungs: Clear to auscultation bilaterally, respirations unlabored  Heart: Regular rate and rhythm, S1 and S2 normal, no murmur, rub or gallop  Abdomen: Soft, non-tender, bowel sounds active all four quadrants, no masses, no organomegaly  Extremities: Extremities normal, atraumatic, no cyanosis or edema  Neuro: Oriented x3, persistent left-sided weakness, chronic    Lab Results: Results for orders placed during the hospital encounter of 07/20/13 (from the past 48 hour(s))  CBG MONITORING, ED     Status: Abnormal   Collection Time    07/20/13 12:00 PM      Result Value Ref Range   Glucose-Capillary 164 (*) 70 - 99 mg/dL   Comment 1 Documented in Chart     Comment 2 Notify RN    CBC WITH DIFFERENTIAL     Status: Abnormal   Collection Time    07/20/13 12:10 PM      Result Value Ref Range   WBC 6.4  4.0 - 10.5 K/uL   RBC 2.97 (*) 3.87 - 5.11 MIL/uL   Hemoglobin 8.8 (*) 12.0 - 15.0 g/dL   HCT 27.0 (*) 36.0 - 46.0 %   MCV 90.9  78.0 - 100.0 fL   MCH 29.6  26.0 -  34.0 pg   MCHC 32.6  30.0 - 36.0 g/dL   RDW 15.6 (*) 11.5 - 15.5 %   Platelets 213  150 - 400 K/uL   Neutrophils Relative % 59  43 - 77 %   Neutro Abs 3.8  1.7 - 7.7 K/uL   Lymphocytes Relative 27  12 - 46 %   Lymphs Abs 1.7  0.7 - 4.0 K/uL   Monocytes Relative 9  3 - 12 %   Monocytes Absolute 0.6  0.1 - 1.0 K/uL   Eosinophils Relative 5  0 - 5 %   Eosinophils Absolute 0.3  0.0 - 0.7 K/uL   Basophils Relative 1  0 - 1 %   Basophils Absolute 0.0  0.0 - 0.1 K/uL  VALPROIC ACID LEVEL     Status: Abnormal   Collection Time    07/20/13 12:10 PM      Result Value Ref Range   Valproic Acid Lvl 28.1 (*) 50.0 - 100.0 ug/mL  PROTIME-INR     Status: None   Collection Time    07/20/13 12:10 PM      Result Value Ref Range  Prothrombin Time 13.7  11.6 - 15.2 seconds   INR 1.07  0.00 - 1.49  APTT     Status: None   Collection Time    07/20/13 12:10 PM      Result Value Ref Range   aPTT 37  24 - 37 seconds   Comment:            IF BASELINE aPTT IS ELEVATED,     SUGGEST PATIENT RISK ASSESSMENT     BE USED TO DETERMINE APPROPRIATE     ANTICOAGULANT THERAPY.  POC OCCULT BLOOD, ED     Status: None   Collection Time    07/20/13  2:07 PM      Result Value Ref Range   Fecal Occult Bld NEGATIVE  NEGATIVE  URINALYSIS, ROUTINE W REFLEX MICROSCOPIC     Status: Abnormal   Collection Time    07/20/13  2:17 PM      Result Value Ref Range   Color, Urine YELLOW  YELLOW   APPearance CLEAR  CLEAR   Specific Gravity, Urine 1.019  1.005 - 1.030   pH 5.0  5.0 - 8.0   Glucose, UA NEGATIVE  NEGATIVE mg/dL   Hgb urine dipstick NEGATIVE  NEGATIVE   Bilirubin Urine SMALL (*) NEGATIVE   Ketones, ur NEGATIVE  NEGATIVE mg/dL   Protein, ur NEGATIVE  NEGATIVE mg/dL   Urobilinogen, UA 0.2  0.0 - 1.0 mg/dL   Nitrite NEGATIVE  NEGATIVE   Leukocytes, UA NEGATIVE  NEGATIVE   Comment: MICROSCOPIC NOT DONE ON URINES WITH NEGATIVE PROTEIN, BLOOD, LEUKOCYTES, NITRITE, OR GLUCOSE <1000 mg/dL.  COMPREHENSIVE  METABOLIC PANEL     Status: Abnormal   Collection Time    07/20/13  3:26 PM      Result Value Ref Range   Sodium 134 (*) 137 - 147 mEq/L   Potassium 5.7 (*) 3.7 - 5.3 mEq/L   Chloride 94 (*) 96 - 112 mEq/L   CO2 20  19 - 32 mEq/L   Glucose, Bld 185 (*) 70 - 99 mg/dL   BUN 87 (*) 6 - 23 mg/dL   Creatinine, Ser 5.30 (*) 0.50 - 1.10 mg/dL   Calcium 9.6  8.4 - 10.5 mg/dL   Total Protein 6.6  6.0 - 8.3 g/dL   Albumin 2.9 (*) 3.5 - 5.2 g/dL   AST 17  0 - 37 U/L   ALT 13  0 - 35 U/L   Alkaline Phosphatase 38 (*) 39 - 117 U/L   Total Bilirubin 0.2 (*) 0.3 - 1.2 mg/dL   GFR calc non Af Amer 7 (*) >90 mL/min   GFR calc Af Amer 8 (*) >90 mL/min   Comment: (NOTE)     The eGFR has been calculated using the CKD EPI equation.     This calculation has not been validated in all clinical situations.     eGFR's persistently <90 mL/min signify possible Chronic Kidney     Disease.  Randolm Idol, ED     Status: None   Collection Time    07/20/13  3:52 PM      Result Value Ref Range   Troponin i, poc 0.01  0.00 - 0.08 ng/mL   Comment 3            Comment: Due to the release kinetics of cTnI,     a negative result within the first hours     of the onset of symptoms does not rule out  myocardial infarction with certainty.     If myocardial infarction is still suspected,     repeat the test at appropriate intervals.  I-STAT CHEM 8, ED     Status: Abnormal   Collection Time    07/20/13  3:52 PM      Result Value Ref Range   Sodium 133 (*) 137 - 147 mEq/L   Potassium 5.5 (*) 3.7 - 5.3 mEq/L   Chloride 99  96 - 112 mEq/L   BUN 97 (*) 6 - 23 mg/dL   Creatinine, Ser 6.20 (*) 0.50 - 1.10 mg/dL   Glucose, Bld 186 (*) 70 - 99 mg/dL   Calcium, Ion 1.21  1.13 - 1.30 mmol/L   TCO2 23  0 - 100 mmol/L   Hemoglobin 9.9 (*) 12.0 - 15.0 g/dL   HCT 29.0 (*) 36.0 - 46.0 %  GLUCOSE, CAPILLARY     Status: None   Collection Time    07/20/13 10:35 PM      Result Value Ref Range   Glucose-Capillary 99   70 - 99 mg/dL   Comment 1 Documented in Chart     Comment 2 Notify RN    BASIC METABOLIC PANEL     Status: Abnormal   Collection Time    07/20/13 10:59 PM      Result Value Ref Range   Sodium 139  137 - 147 mEq/L   Potassium 5.5 (*) 3.7 - 5.3 mEq/L   Chloride 98  96 - 112 mEq/L   CO2 24  19 - 32 mEq/L   Glucose, Bld 110 (*) 70 - 99 mg/dL   BUN 82 (*) 6 - 23 mg/dL   Creatinine, Ser 4.50 (*) 0.50 - 1.10 mg/dL   Calcium 9.8  8.4 - 10.5 mg/dL   GFR calc non Af Amer 8 (*) >90 mL/min   GFR calc Af Amer 10 (*) >90 mL/min   Comment: (NOTE)     The eGFR has been calculated using the CKD EPI equation.     This calculation has not been validated in all clinical situations.     eGFR's persistently <90 mL/min signify possible Chronic Kidney     Disease.  CBC     Status: Abnormal   Collection Time    07/21/13  5:02 AM      Result Value Ref Range   WBC 7.0  4.0 - 10.5 K/uL   RBC 3.97  3.87 - 5.11 MIL/uL   Hemoglobin 11.3 (*) 12.0 - 15.0 g/dL   HCT 36.0  36.0 - 46.0 %   MCV 90.7  78.0 - 100.0 fL   MCH 28.5  26.0 - 34.0 pg   MCHC 31.4  30.0 - 36.0 g/dL   RDW 15.7 (*) 11.5 - 15.5 %   Platelets 254  150 - 400 K/uL  BASIC METABOLIC PANEL     Status: Abnormal   Collection Time    07/21/13  5:02 AM      Result Value Ref Range   Sodium 142  137 - 147 mEq/L   Potassium 4.9  3.7 - 5.3 mEq/L   Chloride 100  96 - 112 mEq/L   CO2 24  19 - 32 mEq/L   Glucose, Bld 114 (*) 70 - 99 mg/dL   BUN 72 (*) 6 - 23 mg/dL   Creatinine, Ser 3.58 (*) 0.50 - 1.10 mg/dL   Calcium 9.6  8.4 - 10.5 mg/dL   GFR calc non Af Amer 11 (*) >90 mL/min  GFR calc Af Amer 13 (*) >90 mL/min   Comment: (NOTE)     The eGFR has been calculated using the CKD EPI equation.     This calculation has not been validated in all clinical situations.     eGFR's persistently <90 mL/min signify possible Chronic Kidney     Disease.  HEMOGLOBIN A1C     Status: Abnormal   Collection Time    07/21/13  5:02 AM      Result Value Ref  Range   Hemoglobin A1C 8.0 (*) <5.7 %   Comment: (NOTE)                                                                               According to the ADA Clinical Practice Recommendations for 2011, when     HbA1c is used as a screening test:      >=6.5%   Diagnostic of Diabetes Mellitus               (if abnormal result is confirmed)     5.7-6.4%   Increased risk of developing Diabetes Mellitus     References:Diagnosis and Classification of Diabetes Mellitus,Diabetes     QBHA,1937,90(WIOXB 1):S62-S69 and Standards of Medical Care in             Diabetes - 2011,Diabetes Care,2011,34 (Suppl 1):S11-S61.   Mean Plasma Glucose 183 (*) <117 mg/dL   Comment: Performed at [place]: Abnormal   Collection Time    07/21/13  5:02 AM      Result Value Ref Range   Cholesterol 199  0 - 200 mg/dL   Triglycerides 457 (*) <150 mg/dL   HDL 35 (*) >39 mg/dL   Total CHOL/HDL Ratio 5.7     VLDL UNABLE TO CALCULATE IF TRIGLYCERIDE OVER 400 mg/dL  0 - 40 mg/dL   LDL Cholesterol UNABLE TO CALCULATE IF TRIGLYCERIDE OVER 400 mg/dL  0 - 99 mg/dL   Comment:            Total Cholesterol/HDL:CHD Risk     Coronary Heart Disease Risk Table                         Men   Women      1/2 Average Risk   3.4   3.3      Average Risk       5.0   4.4      2 X Average Risk   9.6   7.1      3 X Average Risk  23.4   11.0                Use the calculated Patient Ratio     above and the CHD Risk Table     to determine the patient's CHD Risk.                ATP III CLASSIFICATION (LDL):      <100     mg/dL   Optimal      100-129  mg/dL   Near or Above  Optimal      130-159  mg/dL   Borderline      160-189  mg/dL   High      >190     mg/dL   Very High  GLUCOSE, CAPILLARY     Status: Abnormal   Collection Time    07/21/13  6:57 AM      Result Value Ref Range   Glucose-Capillary 161 (*) 70 - 99 mg/dL   Comment 1 Documented in Chart     Comment 2 Notify RN     GLUCOSE, CAPILLARY     Status: Abnormal   Collection Time    07/21/13 11:57 AM      Result Value Ref Range   Glucose-Capillary 176 (*) 70 - 99 mg/dL   Comment 1 Notify RN     Comment 2 Documented in Chart    GLUCOSE, CAPILLARY     Status: Abnormal   Collection Time    07/21/13  4:30 PM      Result Value Ref Range   Glucose-Capillary 164 (*) 70 - 99 mg/dL   Comment 1 Notify RN     Comment 2 Documented in Chart    GLUCOSE, CAPILLARY     Status: Abnormal   Collection Time    07/21/13  9:45 PM      Result Value Ref Range   Glucose-Capillary 166 (*) 70 - 99 mg/dL  GLUCOSE, CAPILLARY     Status: Abnormal   Collection Time    07/22/13  6:52 AM      Result Value Ref Range   Glucose-Capillary 204 (*) 70 - 99 mg/dL    Studies/Results: Dg Chest 2 View  07/20/2013   CLINICAL DATA:  Aphasia and weakness with history of previous tobacco use  EXAM: CHEST  2 VIEW  COMPARISON:  DG CHEST 1V PORT dated 09/23/2011  FINDINGS: The lungs are reasonably well inflated for the sitting position. There is no focal infiltrate. There is no significant pleural effusion. There is no pneumothorax. The cardiac silhouette is top-normal in size. The pulmonary vascularity is not engorged. There is stable calcification of the pleura in the pulmonary apices.  IMPRESSION: There is no evidence of pneumonia. Mild prominence of the interstitial markings is present especially on the right which may reflect the patient's smoking history. There is no evidence of CHF.   Electronically Signed   By: David  Martinique   On: 07/20/2013 12:51   Ct Head Wo Contrast  07/20/2013   CLINICAL DATA:  Mental status change, weakness  EXAM: CT HEAD WITHOUT CONTRAST  TECHNIQUE: Contiguous axial images were obtained from the base of the skull through the vertex without intravenous contrast.  COMPARISON:  CT HEAD W/O CM dated 09/23/2011  FINDINGS: There is mild diffuse cerebral and cerebellar atrophy with compensatory ventriculomegaly. There is decreased  density in the deep white matter of both cerebral hemispheres consistent with chronic small vessel ischemic change. There is no evidence of an acute ischemic or hemorrhagic infarction or evidence of other intracranial hemorrhagic process. The cerebellum and brainstem are normal in density.  At bone window settings there is chronic opacification of the left maxillary sinus. Erosive change of the bony medial maxillary sinus wall is again demonstrated and extension of soft tissue density material into the left nasal passage is present. The remainder the paranasal sinuses appear normal where visualized. The mastoid air cells are clear. There is no evidence of an acute skull fracture.  IMPRESSION: 1. There is no evidence of  an acute intracranial hemorrhage nor of an evolving ischemic infarction. 2. There is mild diffuse cerebral and cerebellar atrophy with evidence of chronic small vessel ischemic type change. 3. There is chronic abnormality of the left maxillary sinus. 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   Electronically Signed   By: David  [place]   On: 07/20/2013 13:45   Mr Brain Wo Contrast  07/20/2013   CLINICAL DATA:  Stroke.  Diabetes and hypertension  EXAM: MRI HEAD WITHOUT CONTRAST  TECHNIQUE: Multiplanar, multiecho pulse sequences of the brain and surrounding structures were obtained without intravenous contrast.  COMPARISON:  CT head 07/20/2013  FINDINGS: Moderate atrophy. Negative for hydrocephalus. Moderate chronic changes throughout the cerebral white matter similar to the prior study. Mild hyperintensity in the right pons. Diffuse white matter changes are similar to the MRI of 08/30/2010  Negative for acute infarct. Negative for hemorrhage or mass. No shift of the midline structures.  Mild mucosal edema in the paranasal sinuses.  IMPRESSION: Atrophy and chronic microvascular ischemic changes in the white matter. No acute abnormality   Electronically Signed   By: Franchot Gallo M.D.   On: 07/20/2013  17:48   Medications: Scheduled Meds: . allopurinol  150 mg Oral QHS  . amLODipine  5 mg Oral Daily  . aspirin  300 mg Rectal Daily   Or  . aspirin  325 mg Oral Daily  . cholecalciferol  8,000 Units Oral Daily  . cloNIDine  0.1 mg Oral BID  . enoxaparin (LOVENOX) injection  30 mg Subcutaneous Q24H  . insulin aspart  0-5 Units Subcutaneous QHS  . insulin aspart  0-9 Units Subcutaneous TID WC  . insulin glargine  30 Units Subcutaneous Daily  . omega-3 acid ethyl esters  1 g Oral BID  . polyvinyl alcohol  1 drop Both Eyes BID  . traZODone  50 mg Oral QHS   Continuous Infusions:  PRN Meds:.acetaminophen, acetaminophen, senna-docusate  Assessment/Plan: Active Problems:   Renal failure, acute on chronic   Dehydration with hyponatremia   Anemia   Acute encephalopathy   Stroke   Hypertension   Diabetes type 2, uncontrolled   Diarrhea  1. Acute encephalopathy: Improved with treatment of acute renal failure. MRI of the brain without acute stroke. Patient passed bedside swallow screen. Patient had some hallucinations last night but is alert and clear and able to converse meaningfully and manage her breakfast tray. Did not sleep last night and we'll 3 and Seroquel and trazodone at bedtime. Continue to hold Depakote and Ativan 2. Acute on chronic renal failure/hyperkalemia: Baseline creatinine is 1.3-1.5. Acute renal failure is most likely secondary to dehydration and medications-lisinopril, losartan, meloxicam, Lasix and HCTZ. Hold the culprit medications. Continue to hydrate with IV fluids and follow daily BMP. Check creatinine again today, Check renal ultrasound to rule out obstruction. Hyperkalemia improved with hydration alone but will use Kayexalate as needed. No indications for urgent dialysis. 3. Dehydration with hyponatremia: Secondary to poor oral intake and diuretics. Continue IV fluid hydration. Clinically better hydrated with moist mucous membranes and well-hydrated tongue. Continue  fluids at 60 cc an hour along with oral intake 4. Uncontrolled type II DM: Continue reduced dose of Lantus to avoid in-house hypoglycemia and NovoLog SSI. Hold metformin secondary to acute renal failure. 5. Hypertension: Blood pressure rising. Continue low dose amlodipine and continue to hold lisinopril, losartan, HCTZ and resume clonidine and bisoprolol. 6. Anemia: Likely secondary to chronic kidney disease. No history of bleeding. CBC pending today. Transfuse if hemoglobin is  less than 7 g per DL. FOBT negative. 7. 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 8. History of gout: Continue allopurinol - consider holding if renal function does not improve quickly. 9. Disposition:  Probable transfer to assisted living in a.m.   LOS: 2 days   Henrine Screws, MD 07/22/2013, 7:34 AM

## 2013-07-22 NOTE — Progress Notes (Signed)
Inpatient Diabetes Program Recommendations  AACE/ADA: New Consensus Statement on Inpatient Glycemic Control (2013)  Target Ranges:  Prepandial:   less than 140 mg/dL      Peak postprandial:   less than 180 mg/dL (1-2 hours)      Critically ill patients:  140 - 180 mg/dL   Reason for Visit: Results for KRISTY-LEE, FLOHR (MRN [id number]) as of 07/22/2013 12:51  Ref. Range 07/22/2013 06:52 07/22/2013 12:04  Glucose-Capillary Latest Range: 70-99 mg/dL 204 (H) 257 (H)    Consider increasing Lantus to 36 units daily.    Thanks, Adah Perl, RN, BC-ADM Inpatient Diabetes Coordinator Pager [phone]

## 2013-07-22 NOTE — Progress Notes (Signed)
Subjective: Patient awake and alert.  Seems to be close to baseline.  No complaints.    Objective: Current vital signs: BP 196/79  Pulse 82  Temp(Src) 98.3 F (36.8 C) (Oral)  Resp 18  Ht 5' (1.524 m)  Wt 57.5 kg (126 lb 12.2 oz)  BMI 24.76 kg/m2  SpO2 99% Vital signs in last 24 hours: Temp:  [97.7 F (36.5 C)-98.6 F (37 C)] 98.3 F (36.8 C) (03/11 0949) Pulse Rate:  [74-82] 82 (03/11 0949) Resp:  [18-20] 18 (03/11 0949) BP: (139-196)/(44-101) 196/79 mmHg (03/11 0949) SpO2:  [94 %-100 %] 99 % (03/11 0949)  Intake/Output from previous day: 03/10 0701 - 03/11 0700 In: 240 [P.O.:240] Out: -  Intake/Output this shift: Total I/O In: 380 [P.O.:380] Out: -  Nutritional status: Cardiac  Neurologic Exam: Mental Status: Alert.  Does not know where she is but does know that she resides at [facility]. Speech fluent without evidence of aphasia.  Able to follow commands without difficulty. Cranial Nerves: II: Discs flat bilaterally; Visual fields grossly normal, pupils equal, round, reactive to light and accommodation III,IV, VI: ptosis not present, extra-ocular motions intact bilaterally V,VII: left facial droop, facial light touch sensation normal bilaterally VIII: hearing normal bilaterally IX,X: gag reflex present XI: bilateral shoulder shrug XII: midline tongue extension Motor: Able to lift the left arm and leg off the bed but less so than on the right.   Tone and bulk:increased tone on the left Sensory: Decreased on the left arm and leg Deep Tendon Reflexes: 2+ in the upper extremities, 1+ at the knees, absent at the ankles Plantars: Right: downgoing   Left: downgoing Cerebellar: normal finger-to-nose on the right   Lab Results: Basic Metabolic Panel:  Recent Labs Lab 07/20/13 1526 07/20/13 1552 07/20/13 2259 07/21/13 0502 07/22/13 0725  NA 134* 133* 139 142 140  K 5.7* 5.5* 5.5* 4.9 3.9  CL 94* 99 98 100 98  CO2 20  --  24 24 25   GLUCOSE 185* 186* 110* 114*  214*  BUN 87* 97* 82* 72* 49*  CREATININE 5.30* 6.20* 4.50* 3.58* 1.81*  CALCIUM 9.6  --  9.8 9.6 10.1    Liver Function Tests:  Recent Labs Lab 07/20/13 1526  AST 17  ALT 13  ALKPHOS 38*  BILITOT 0.2*  PROT 6.6  ALBUMIN 2.9*   No results found for this basename: LIPASE, AMYLASE,  in the last 168 hours No results found for this basename: AMMONIA,  in the last 168 hours  CBC:  Recent Labs Lab 07/20/13 1210 07/20/13 1552 07/21/13 0502 07/22/13 0910  WBC 6.4  --  7.0 13.2*  NEUTROABS 3.8  --   --  9.1*  HGB 8.8* 9.9* 11.3* 11.2*  HCT 27.0* 29.0* 36.0 34.8*  MCV 90.9  --  90.7 90.2  PLT 213  --  254 376    Cardiac Enzymes: No results found for this basename: CKTOTAL, CKMB, CKMBINDEX, TROPONINI,  in the last 168 hours  Lipid Panel:  Recent Labs Lab 07/21/13 0502  CHOL 199  TRIG 457*  HDL 35*  CHOLHDL 5.7  VLDL UNABLE TO CALCULATE IF TRIGLYCERIDE OVER 400 mg/dL  LDLCALC UNABLE TO CALCULATE IF TRIGLYCERIDE OVER 400 mg/dL    CBG:  Recent Labs Lab 07/21/13 0657 07/21/13 1157 07/21/13 1630 07/21/13 2145 07/22/13 0652  GLUCAP 161* 176* 164* 166* 204*    Microbiology: Results for orders placed during the hospital encounter of 08/29/10  URINE CULTURE     Status: None  Collection Time    08/29/10  3:13 PM      Result Value Ref Range Status   Specimen Description URINE, CATHETERIZED   Final   Special Requests NONE   Final   Culture  Setup Time [id number]   Final   Colony Count NO GROWTH   Final   Culture NO GROWTH   Final   Report Status 08/30/2010 FINAL   Final    Coagulation Studies:  Recent Labs  07/20/13 1210  LABPROT 13.7  INR 1.07    Imaging: Dg Chest 2 View  07/20/2013   CLINICAL DATA:  Aphasia and weakness with history of previous tobacco use  EXAM: CHEST  2 VIEW  COMPARISON:  DG CHEST 1V PORT dated 09/23/2011  FINDINGS: The lungs are reasonably well inflated for the sitting position. There is no focal infiltrate. There is no  significant pleural effusion. There is no pneumothorax. The cardiac silhouette is top-normal in size. The pulmonary vascularity is not engorged. There is stable calcification of the pleura in the pulmonary apices.  IMPRESSION: There is no evidence of pneumonia. Mild prominence of the interstitial markings is present especially on the right which may reflect the patient's smoking history. There is no evidence of CHF.   Electronically Signed   By: David  Martinique   On: 07/20/2013 12:51   Ct Head Wo Contrast  07/20/2013   CLINICAL DATA:  Mental status change, weakness  EXAM: CT HEAD WITHOUT CONTRAST  TECHNIQUE: Contiguous axial images were obtained from the base of the skull through the vertex without intravenous contrast.  COMPARISON:  CT HEAD W/O CM dated 09/23/2011  FINDINGS: There is mild diffuse cerebral and cerebellar atrophy with compensatory ventriculomegaly. There is decreased density in the deep white matter of both cerebral hemispheres consistent with chronic small vessel ischemic change. There is no evidence of an acute ischemic or hemorrhagic infarction or evidence of other intracranial hemorrhagic process. The cerebellum and brainstem are normal in density.  At bone window settings there is chronic opacification of the left maxillary sinus. Erosive change of the bony medial maxillary sinus wall is again demonstrated and extension of soft tissue density material into the left nasal passage is present. The remainder the paranasal sinuses appear normal where visualized. The mastoid air cells are clear. There is no evidence of an acute skull fracture.  IMPRESSION: 1. There is no evidence of an acute intracranial hemorrhage nor of an evolving ischemic infarction. 2. There is mild diffuse cerebral and cerebellar atrophy with evidence of chronic small vessel ischemic type change. 3. There is chronic abnormality of the left maxillary sinus. 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   Electronically Signed   By:  David  Martinique   On: 07/20/2013 13:45   Mr Brain Wo Contrast  07/20/2013   CLINICAL DATA:  Stroke.  Diabetes and hypertension  EXAM: MRI HEAD WITHOUT CONTRAST  TECHNIQUE: Multiplanar, multiecho pulse sequences of the brain and surrounding structures were obtained without intravenous contrast.  COMPARISON:  CT head 07/20/2013  FINDINGS: Moderate atrophy. Negative for hydrocephalus. Moderate chronic changes throughout the cerebral white matter similar to the prior study. Mild hyperintensity in the right pons. Diffuse white matter changes are similar to the MRI of 08/30/2010  Negative for acute infarct. Negative for hemorrhage or mass. No shift of the midline structures.  Mild mucosal edema in the paranasal sinuses.  IMPRESSION: Atrophy and chronic microvascular ischemic changes in the white matter. No acute abnormality   Electronically Signed   By:  Franchot Gallo M.D.   On: 07/20/2013 17:48    Medications:  I have reviewed the patient's current medications. Scheduled: . allopurinol  150 mg Oral QHS  . amLODipine  5 mg Oral Daily  . aspirin  300 mg Rectal Daily   Or  . aspirin  325 mg Oral Daily  . bisoprolol  10 mg Oral Daily  . cholecalciferol  8,000 Units Oral Daily  . cloNIDine  0.1 mg Oral BID  . enoxaparin (LOVENOX) injection  30 mg Subcutaneous Q24H  . insulin aspart  0-5 Units Subcutaneous QHS  . insulin aspart  0-9 Units Subcutaneous TID WC  . insulin glargine  30 Units Subcutaneous Daily  . omega-3 acid ethyl esters  1 g Oral BID  . polyvinyl alcohol  1 drop Both Eyes BID  . traZODone  50 mg Oral QHS    Assessment/Plan: Patient improved.  Seems at baseline with improvement in mental status as renal function has improved.  MRI showed no acute infarct.    Recommendations: 1.  No further neurologic intervention is recommended at this time.  If further questions arise, please call or page at that time.  Thank you for allowing neurology to participate in the care of this  patient.  Alexis Goodell, MD [facility] [phone] 07/22/2013  11:11 AM    LOS: 2 days

## 2013-07-22 NOTE — Progress Notes (Signed)
Patient noted to not have slept none during shift. Experienced urinary frequency all shift. Urine clear and yellow. Patient experienced hallucinations and slight change in mental status. Patient reoriented and redirected. Will pass off to am nurse to monitor baseline mental status and report as needed.

## 2013-07-22 NOTE — Evaluation (Signed)
SLP Cancellation Note  Patient Details Name: [name] MRN: [id number] DOB: 01/11/1929   Cancelled treatment:       Reason Eval/Treat Not Completed: Patient at procedure or test/unavailable (pt at ultrasound per nurse secretary, will reattempt eval at later time)   Claudie Fisherman, [place] [facility] SLP [phone]

## 2013-07-23 LAB — GLUCOSE, CAPILLARY
GLUCOSE-CAPILLARY: 215 mg/dL — AB (ref 70–99)
Glucose-Capillary: 168 mg/dL — ABNORMAL HIGH (ref 70–99)
Glucose-Capillary: 270 mg/dL — ABNORMAL HIGH (ref 70–99)
Glucose-Capillary: 176 mg/dL — ABNORMAL HIGH (ref 70–99)

## 2013-07-23 LAB — CBC WITH DIFFERENTIAL/PLATELET
Basophils Absolute: 0 10*3/uL (ref 0.0–0.1)
Basophils Relative: 0 % (ref 0–1)
EOS ABS: 0.2 10*3/uL (ref 0.0–0.7)
Eosinophils Relative: 3 % (ref 0–5)
HCT: 33.2 % — ABNORMAL LOW (ref 36.0–46.0)
Hemoglobin: 10.7 g/dL — ABNORMAL LOW (ref 12.0–15.0)
LYMPHS ABS: 1.9 10*3/uL (ref 0.7–4.0)
LYMPHS PCT: 22 % (ref 12–46)
MCH: 29.1 pg (ref 26.0–34.0)
MCHC: 32.2 g/dL (ref 30.0–36.0)
MCV: 90.2 fL (ref 78.0–100.0)
Monocytes Absolute: 0.8 10*3/uL (ref 0.1–1.0)
Monocytes Relative: 10 % (ref 3–12)
NEUTROS PCT: 66 % (ref 43–77)
Neutro Abs: 5.7 10*3/uL (ref 1.7–7.7)
PLATELETS: 302 10*3/uL (ref 150–400)
RBC: 3.68 MIL/uL — AB (ref 3.87–5.11)
RDW: 15.9 % — ABNORMAL HIGH (ref 11.5–15.5)
WBC: 8.6 10*3/uL (ref 4.0–10.5)

## 2013-07-23 MED ORDER — BISOPROLOL FUMARATE 10 MG PO TABS: 10.0000 mg | ORAL_TABLET | Freq: Every day | ORAL | Status: AC

## 2013-07-23 MED ORDER — INSULIN GLARGINE 100 UNIT/ML SOLOSTAR PEN: 40.0000 [IU] | PEN_INJECTOR | SUBCUTANEOUS | Status: DC

## 2013-07-23 MED ORDER — LOSARTAN POTASSIUM 100 MG PO TABS: 100.0000 mg | ORAL_TABLET | Freq: Every day | ORAL | Status: DC

## 2013-07-23 MED ORDER — FUROSEMIDE 40 MG PO TABS: 40.0000 mg | ORAL_TABLET | ORAL | Status: DC

## 2013-07-23 NOTE — Evaluation (Signed)
Speech Language Pathology Evaluation Patient Details Name: [name] MRN: [id number] DOB: 1928/09/23 Today's Date: 07/23/2013 Time: [id number] SLP Time Calculation (min): 14 min  Problem List:  Patient Active Problem List   Diagnosis Date Noted  . Diarrhea 07/21/2013  . Renal failure, acute on chronic 07/20/2013  . Dehydration with hyponatremia 07/20/2013  . Anemia 07/20/2013  . Acute encephalopathy 07/20/2013  . Diabetes type 2, uncontrolled 07/20/2013  . Stroke   . Hypertension   . Hx of radiation therapy   . Cancer   . Malignant neoplasm of corpus uteri, except isthmus 06/11/2011   Past Medical History:  Past Medical History  Diagnosis Date  . Diabetes mellitus   . Hypertension   . Gout   . Pneumonia   . Stroke     L sided deficits w/slurred speech  . Cancer 07/04/10    endometrial  . Falling episodes   . Hx of radiation therapy 09/13/10- 10/19/10, 6/12, 6/19, 11/14/10    pelvis, external beam tehn brachytherapy   Past Surgical History:  Past Surgical History  Procedure Laterality Date  . Abdominal hysterectomy     HPI:  80 y.o. 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 As per their discussion with nursing facility, patient has been progressively weak, not able to feed herself which is unusual for her, sleeping excessively, low blood pressure, fluctuating blood sugars and hence sent to the hospital for evaluation.  MRI revealed Atrophy and chronic microvascular ischemic changes in the white matter. No acute abnormality.    Assessment / Plan / Recommendation Clinical Impression  No family present to inquire baseline cognitive functioning.  She appears functional for basic, hospital situations regarding cognitive abilities.  Suspect mild baseline defictis provided  with new/unfamiliar information or routine and may need cueing/prompts.  Pt. demonstrated appropriate reasoning and inquired why she is taking a certain med; able to recall RN's name.  Suspect she is at her baseline from cognitive standpoint and no further ST recommended.    SLP Assessment  Patient does not need any further Speech Lanaguage Pathology Services    Follow Up Recommendations  None    Frequency and Duration        Pertinent Vitals/Pain WDL       SLP Evaluation Prior Functioning  Cognitive/Linguistic Baseline: Information not available (suspect memory difficulties) Type of Home:  (ALF [facility])   Cognition  Overall Cognitive Status: No family/caregiver present to determine baseline cognitive functioning (suspect impairments in memory) Arousal/Alertness: Awake/alert Orientation Level: Disoriented to time;Oriented to person;Oriented to place;Oriented to situation ("I don't keep up with the days") Attention: Sustained Sustained Attention: Appears intact Memory:  (functional for assessment) Awareness: Appears intact Problem Solving: Appears intact (for verbal problem solving) Safety/Judgment: Appears intact    Comprehension  Auditory Comprehension Overall Auditory Comprehension: Appears within functional limits for tasks assessed Visual Recognition/Discrimination Discrimination: Not tested Reading Comprehension Reading Status: Not tested    Expression Expression Primary Mode of Expression: Verbal Verbal Expression Overall Verbal Expression: Appears within functional limits for tasks assessed Written Expression Written Expression: Not tested   Oral / Motor Oral Motor/Sensory Function Overall Oral Motor/Sensory Function: Appears within functional limits for tasks assessed Motor Speech Overall Motor Speech: Appears within functional limits for tasks assessed   GO     Houston Siren M.Ed [employer] [phone]  07/23/2013  

## 2013-07-23 NOTE — Discharge Summary (Signed)
Physician Discharge Summary  NAME:Kenady CERISE LENOIR  [id number]  DOB: [birth year]   Admit date: 07/20/2013 Discharge date: 07/23/2013  Discharge Diagnoses:  Active Problems:   Renal failure, acute on chronic   Dehydration with hyponatremia   Anemia   Acute encephalopathy   Stroke   Hypertension   Diabetes type 2, uncontrolled   Diarrhea  1. Acute encephalopathy: Improved with treatment of acute renal failure. MRI of the brain without acute stroke. Patient passed bedside swallow screen. Patient had some hallucinations night before last but is alert and clear and able to converse meaningfully and manage her breakfast tray. Continue to hold Ativan and Depakote 2. Acute on chronic renal failure/hyperkalemia: Baseline creatinine is 1.3-1.5. Acute renal failure is most likely secondary to dehydration and medications-lisinopril, losartan, meloxicam, Lasix and HCTZ. Hold the culprit medications. Continue to hydrate with IV fluids until discharge in a.m. Renal ultrasound ruled out obstruction. Hyperkalemia improved with hydration alone. 3. Dehydration with hyponatremia: Secondary to poor oral intake and diuretics. Continue IV fluid hydration until discharge. Clinically better hydrated with moist mucous membranes and well-hydrated tongue. Continue fluids at 60 cc an hour along with oral intake 4. Uncontrolled type II DM: Continue reduced dose of Lantus to avoid in-house hypoglycemia and NovoLog SSI. Hold metformin secondary to acute renal failure. Resume if creatinine returns to lower than 1.5 5. Hypertension: Blood pressure improved. Continue low dose amlodipine and continue to hold lisinopril,resume clonidine and bisoprolol and losartan when she gets back to the assisted living 6. Anemia: Likely secondary to chronic kidney disease. No history of bleeding. CBC pending today. Transfuse if hemoglobin is less than 7 g per DL. FOBT negative. 7. History of prior CVA and residual left hemiparesis and  dysarthria: Management as above. 8. History of gout: Continue allopurinol - consider holding if renal function does not improve quickly. 9. Disposition: Transfer to assisted living in a.m. - it is too late to transfer today    Discharge Physical Exam:  General Appearance: Alert, cooperative, no distress, appears stated age  Weight change:   Intake/Output Summary (Last 24 hours) at 07/23/13 1646 Last data filed at 07/22/13 1706  Gross per 24 hour  Intake    240 ml  Output      0 ml  Net    240 ml   Filed Vitals:   07/22/13 2155 07/23/13 0225 07/23/13 0539 07/23/13 0928  BP: 196/68 134/56 132/55 157/74  Pulse:  74 73 75  Temp:  98 F (36.7 C) 98.3 F (36.8 C) 98.5 F (36.9 C)  TempSrc:  Oral Oral Oral  Resp:  20 20 18   Height:      Weight:      SpO2:  100% 100% 96%     General Appearance: Alert, cooperative, no distress, appears stated age  Lungs: Clear to auscultation bilaterally, respirations unlabored  Heart: Regular rate and rhythm, S1 and S2 normal, no murmur, rub or gallop  Abdomen: Soft, non-tender, bowel sounds active all four quadrants, no masses, no organomegaly  Extremities: Extremities normal, atraumatic, no cyanosis or edema  Neuro: Oriented x3, persistent left-sided weakness, chronic   Discharge Condition: Improved  Hospital Course: MAKENNA AMOROSO is a 80 y.o. 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 She was unable to provide substantial history secondary to her mental status change. She had become  progressively weak, not able to feed herself which was unusual for her, sleeping excessively, low blood pressure, fluctuating blood sugars and hence sent to the hospital for evaluation.In the ED, they seem to notice some facial asymmetry. In the ED, CT head  without acute findings, sodium 134, potassium 5.7, chloride 94, bicarbonate 20, glucose 185, BUN 87, creatinine 5.3, albumin 2.9, hemoglobin 8.8 and chest x-ray without evidence of pneumonia. Neurology was consulted for possible stroke rule out. MRI of the brain revealed no stroke and with hydration and correction of renal failure, mental status returned to baseline. Renal echo was performed and there was no hydronephrosis. Blood pressure has been well-controlled with institution of some of her previous medications. She will be transferred back to [facility] assisted [facility] in the a.m.   Things to follow up in the outpatient setting: Blood pressure in mental status and renal function  Consults: Inpatient neurology  Disposition: Transfer via ambulance in a.m. to [facility] in [place], [place]  Discharge Orders   Future Orders Complete By Expires   Call MD for:  difficulty breathing, headache or visual disturbances  As directed    Call MD for:  extreme fatigue  As directed    Call MD for:  persistant nausea and vomiting  As directed    Call MD for:  severe uncontrolled pain  As directed    Call MD for:  temperature >100.4  As directed    Diet - low sodium heart healthy  As directed    Increase activity slowly  As directed        Medication List    STOP taking these medications       bisoprolol-hydrochlorothiazide 10-6.25 MG per tablet  Commonly known as:  ZIAC     lisinopril 10 MG tablet  Commonly known as:  PRINIVIL,ZESTRIL     metFORMIN 500 MG tablet  Commonly known as:  GLUCOPHAGE     potassium chloride 10 MEQ tablet  Commonly known as:  K-DUR,KLOR-CON     QUEtiapine 100 MG tablet  Commonly known as:  SEROQUEL      TAKE these medications       allopurinol 100 MG tablet  Commonly known as:  ZYLOPRIM  Take 150 mg by mouth at bedtime.     amLODipine 5 MG tablet  Commonly known as:  NORVASC  Take 5 mg by mouth daily.     bisoprolol 10 MG tablet   Commonly known as:  ZEBETA  Take 1 tablet (10 mg total) by mouth daily.     carboxymethylcellulose 0.5 % Soln  Commonly known as:  REFRESH PLUS  Place 1 drop into both eyes 2 (two) times daily.     cloNIDine 0.3 MG tablet  Commonly known as:  CATAPRES  Take 0.3 mg by mouth at bedtime.     divalproex 250 MG 24 hr tablet  Commonly known as:  DEPAKOTE ER  Take 250 mg by mouth at bedtime.     fish oil-omega-3 fatty acids 1000 MG capsule  Take 2 g by mouth 2 (two) times daily.     FLUoxetine 20 MG capsule  Commonly known as:  PROZAC  Take 20 mg by mouth daily.     furosemide 40 MG tablet  Commonly known as:  LASIX  Take 1 tablet (40 mg total) by mouth See admin instructions. Takes 40 mg every Monday, Wednesday and Friday     gabapentin 300 MG capsule  Commonly known as:  NEURONTIN  Take 300 mg by mouth at bedtime.     HM VITAMIN D3 4000 UNITS Caps  Generic drug:  Cholecalciferol  Take 8,000 Units by mouth daily.     insulin aspart 100 UNIT/ML injection  Commonly known as:  novoLOG  Inject 0-12 Units into the skin 2 (two) times daily. Sliding scale <150=0 units 151-200=4 units 201-250=6 units 251-300=8 units 301-350=10 units 351-400=12 units     Insulin Glargine 100 UNIT/ML Solostar Pen  Commonly known as:  LANTUS SOLOSTAR  Inject 40 Units into the skin every morning.     LORazepam 0.5 MG tablet  Commonly known as:  ATIVAN  Take 0.5 mg by mouth every 6 (six) hours as needed for anxiety.     losartan 100 MG tablet  Commonly known as:  COZAAR  Take 1 tablet (100 mg total) by mouth daily.     meloxicam 7.5 MG tablet  Commonly known as:  MOBIC  Take 7.5 mg by mouth daily.     promethazine 25 MG suppository  Commonly known as:  PHENERGAN  Place 25 mg rectally every 6 (six) hours as needed for nausea.     promethazine 25 MG tablet  Commonly known as:  PHENERGAN  Take 25 mg by mouth every 6 (six) hours as needed for nausea.     traZODone 50 MG tablet  Commonly  known as:  DESYREL  Take 50 mg by mouth at bedtime.     VITAMIN B 12 PO  Take 1,000 mcg by mouth.         The results of significant diagnostics from this hospitalization (including imaging, microbiology, ancillary and laboratory) are listed below for reference.    Significant Diagnostic Studies: Dg Chest 2 View  07/20/2013   CLINICAL DATA:  Aphasia and weakness with history of previous tobacco use  EXAM: CHEST  2 VIEW  COMPARISON:  DG CHEST 1V PORT dated 09/23/2011  FINDINGS: The lungs are reasonably well inflated for the sitting position. There is no focal infiltrate. There is no significant pleural effusion. There is no pneumothorax. The cardiac silhouette is top-normal in size. The pulmonary vascularity is not engorged. There is stable calcification of the pleura in the pulmonary apices.  IMPRESSION: There is no evidence of pneumonia. Mild prominence of the interstitial markings is present especially on the right which may reflect the patient's smoking history. There is no evidence of CHF.   Electronically Signed   By: David  Martinique   On: 07/20/2013 12:51   Ct Head Wo Contrast  07/20/2013   CLINICAL DATA:  Mental status change, weakness  EXAM: CT HEAD WITHOUT CONTRAST  TECHNIQUE: Contiguous axial images were obtained from the base of the skull through the vertex without intravenous contrast.  COMPARISON:  CT HEAD W/O CM dated 09/23/2011  FINDINGS: There is mild diffuse cerebral and cerebellar atrophy with compensatory ventriculomegaly. There is decreased density in the deep white matter of both cerebral hemispheres consistent with chronic small vessel ischemic change. There is no evidence of an acute ischemic or hemorrhagic infarction or evidence of other intracranial hemorrhagic process. The cerebellum and brainstem are normal in density.  At bone window settings there is chronic opacification of the left maxillary sinus. Erosive change of the bony medial maxillary sinus wall is again demonstrated  and extension of soft tissue density material into the left nasal passage is present. The remainder the paranasal sinuses appear normal where visualized. The mastoid air cells are clear. There is no evidence of an acute  skull fracture.  IMPRESSION: 1. There is no evidence of an acute intracranial hemorrhage nor of an evolving ischemic infarction. 2. There is mild diffuse cerebral and cerebellar atrophy with evidence of chronic small vessel ischemic type change. 3. There is chronic abnormality of the left maxillary sinus. 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   Electronically Signed   By: David  Martinique   On: 07/20/2013 13:45   Mr Brain Wo Contrast  07/20/2013   CLINICAL DATA:  Stroke.  Diabetes and hypertension  EXAM: MRI HEAD WITHOUT CONTRAST  TECHNIQUE: Multiplanar, multiecho pulse sequences of the brain and surrounding structures were obtained without intravenous contrast.  COMPARISON:  CT head 07/20/2013  FINDINGS: Moderate atrophy. Negative for hydrocephalus. Moderate chronic changes throughout the cerebral white matter similar to the prior study. Mild hyperintensity in the right pons. Diffuse white matter changes are similar to the MRI of 08/30/2010  Negative for acute infarct. Negative for hemorrhage or mass. No shift of the midline structures.  Mild mucosal edema in the paranasal sinuses.  IMPRESSION: Atrophy and chronic microvascular ischemic changes in the white matter. No acute abnormality   Electronically Signed   By: Franchot Gallo M.D.   On: 07/20/2013 17:48   US Renal  07/22/2013   CLINICAL DATA Hypertension and diabetes.  EXAM RENAL/URINARY TRACT ULTRASOUND COMPLETE  COMPARISON None.  FINDINGS Right Kidney:  Length: 10.2 cm. Echogenicity within normal limits. No mass or hydronephrosis visualized.  Left Kidney:  Length: 10.2 cm. Echogenicity within normal limits. No mass or hydronephrosis visualized.  Bladder:  Appears normal for degree of bladder distention.  IMPRESSION Normal exam.  SIGNATURE   Electronically Signed   By: Marcello Moores  Register   On: 07/22/2013 12:03    Microbiology: No results found for this or any previous visit (from the past 240 hour(s)).   Labs: Results for orders placed during the hospital encounter of 07/20/13  CBC WITH DIFFERENTIAL      Result Value Ref Range   WBC 6.4  4.0 - 10.5 K/uL   RBC 2.97 (*) 3.87 - 5.11 MIL/uL   Hemoglobin 8.8 (*) 12.0 - 15.0 g/dL   HCT 27.0 (*) 36.0 - 46.0 %   MCV 90.9  78.0 - 100.0 fL   MCH 29.6  26.0 - 34.0 pg   MCHC 32.6  30.0 - 36.0 g/dL   RDW 15.6 (*) 11.5 - 15.5 %   Platelets 213  150 - 400 K/uL   Neutrophils Relative % 59  43 - 77 %   Neutro Abs 3.8  1.7 - 7.7 K/uL   Lymphocytes Relative 27  12 - 46 %   Lymphs Abs 1.7  0.7 - 4.0 K/uL   Monocytes Relative 9  3 - 12 %   Monocytes Absolute 0.6  0.1 - 1.0 K/uL   Eosinophils Relative 5  0 - 5 %   Eosinophils Absolute 0.3  0.0 - 0.7 K/uL   Basophils Relative 1  0 - 1 %   Basophils Absolute 0.0  0.0 - 0.1 K/uL  VALPROIC ACID LEVEL      Result Value Ref Range   Valproic Acid Lvl 28.1 (*) 50.0 - 100.0 ug/mL  PROTIME-INR      Result Value Ref Range   Prothrombin Time 13.7  11.6 - 15.2 seconds   INR 1.07  0.00 - 1.49  APTT      Result Value Ref Range   aPTT 37  24 - 37 seconds  URINALYSIS, ROUTINE W REFLEX MICROSCOPIC  Result Value Ref Range   Color, Urine YELLOW  YELLOW   APPearance CLEAR  CLEAR   Specific Gravity, Urine 1.019  1.005 - 1.030   pH 5.0  5.0 - 8.0   Glucose, UA NEGATIVE  NEGATIVE mg/dL   Hgb urine dipstick NEGATIVE  NEGATIVE   Bilirubin Urine SMALL (*) NEGATIVE   Ketones, ur NEGATIVE  NEGATIVE mg/dL   Protein, ur NEGATIVE  NEGATIVE mg/dL   Urobilinogen, UA 0.2  0.0 - 1.0 mg/dL   Nitrite NEGATIVE  NEGATIVE   Leukocytes, UA NEGATIVE  NEGATIVE  COMPREHENSIVE METABOLIC PANEL      Result Value Ref Range   Sodium 134 (*) 137 - 147 mEq/L   Potassium 5.7 (*) 3.7 - 5.3 mEq/L   Chloride 94 (*) 96 - 112 mEq/L   CO2 20  19 - 32 mEq/L   Glucose, Bld  185 (*) 70 - 99 mg/dL   BUN 87 (*) 6 - 23 mg/dL   Creatinine, Ser 5.30 (*) 0.50 - 1.10 mg/dL   Calcium 9.6  8.4 - 10.5 mg/dL   Total Protein 6.6  6.0 - 8.3 g/dL   Albumin 2.9 (*) 3.5 - 5.2 g/dL   AST 17  0 - 37 U/L   ALT 13  0 - 35 U/L   Alkaline Phosphatase 38 (*) 39 - 117 U/L   Total Bilirubin 0.2 (*) 0.3 - 1.2 mg/dL   GFR calc non Af Amer 7 (*) >90 mL/min   GFR calc Af Amer 8 (*) >90 mL/min  CBC      Result Value Ref Range   WBC 7.0  4.0 - 10.5 K/uL   RBC 3.97  3.87 - 5.11 MIL/uL   Hemoglobin 11.3 (*) 12.0 - 15.0 g/dL   HCT 36.0  36.0 - 46.0 %   MCV 90.7  78.0 - 100.0 fL   MCH 28.5  26.0 - 34.0 pg   MCHC 31.4  30.0 - 36.0 g/dL   RDW 15.7 (*) 11.5 - 15.5 %   Platelets 254  150 - 400 K/uL  BASIC METABOLIC PANEL      Result Value Ref Range   Sodium 142  137 - 147 mEq/L   Potassium 4.9  3.7 - 5.3 mEq/L   Chloride 100  96 - 112 mEq/L   CO2 24  19 - 32 mEq/L   Glucose, Bld 114 (*) 70 - 99 mg/dL   BUN 72 (*) 6 - 23 mg/dL   Creatinine, Ser 3.58 (*) 0.50 - 1.10 mg/dL   Calcium 9.6  8.4 - 10.5 mg/dL   GFR calc non Af Amer 11 (*) >90 mL/min   GFR calc Af Amer 13 (*) >90 mL/min  HEMOGLOBIN A1C      Result Value Ref Range   Hemoglobin A1C 8.0 (*) <5.7 %   Mean Plasma Glucose 183 (*) <117 mg/dL  LIPID PANEL      Result Value Ref Range   Cholesterol 199  0 - 200 mg/dL   Triglycerides 457 (*) <150 mg/dL   HDL 35 (*) >39 mg/dL   Total CHOL/HDL Ratio 5.7     VLDL UNABLE TO CALCULATE IF TRIGLYCERIDE OVER 400 mg/dL  0 - 40 mg/dL   LDL Cholesterol UNABLE TO CALCULATE IF TRIGLYCERIDE OVER 400 mg/dL  0 - 99 mg/dL  BASIC METABOLIC PANEL      Result Value Ref Range   Sodium 139  137 - 147 mEq/L   Potassium 5.5 (*) 3.7 - 5.3 mEq/L  Chloride 98  96 - 112 mEq/L   CO2 24  19 - 32 mEq/L   Glucose, Bld 110 (*) 70 - 99 mg/dL   BUN 82 (*) 6 - 23 mg/dL   Creatinine, Ser 4.50 (*) 0.50 - 1.10 mg/dL   Calcium 9.8  8.4 - 10.5 mg/dL   GFR calc non Af Amer 8 (*) >90 mL/min   GFR calc Af Amer 10  (*) >90 mL/min  GLUCOSE, CAPILLARY      Result Value Ref Range   Glucose-Capillary 99  70 - 99 mg/dL   Comment 1 Documented in Chart     Comment 2 Notify RN    GLUCOSE, CAPILLARY      Result Value Ref Range   Glucose-Capillary 161 (*) 70 - 99 mg/dL   Comment 1 Documented in Chart     Comment 2 Notify RN    GLUCOSE, CAPILLARY      Result Value Ref Range   Glucose-Capillary 176 (*) 70 - 99 mg/dL   Comment 1 Notify RN     Comment 2 Documented in Chart    BASIC METABOLIC PANEL      Result Value Ref Range   Sodium 140  137 - 147 mEq/L   Potassium 3.9  3.7 - 5.3 mEq/L   Chloride 98  96 - 112 mEq/L   CO2 25  19 - 32 mEq/L   Glucose, Bld 214 (*) 70 - 99 mg/dL   BUN 49 (*) 6 - 23 mg/dL   Creatinine, Ser 1.81 (*) 0.50 - 1.10 mg/dL   Calcium 10.1  8.4 - 10.5 mg/dL   GFR calc non Af Amer 25 (*) >90 mL/min   GFR calc Af Amer 29 (*) >90 mL/min  GLUCOSE, CAPILLARY      Result Value Ref Range   Glucose-Capillary 164 (*) 70 - 99 mg/dL   Comment 1 Notify RN     Comment 2 Documented in Chart    GLUCOSE, CAPILLARY      Result Value Ref Range   Glucose-Capillary 166 (*) 70 - 99 mg/dL  GLUCOSE, CAPILLARY      Result Value Ref Range   Glucose-Capillary 204 (*) 70 - 99 mg/dL  CBC WITH DIFFERENTIAL      Result Value Ref Range   WBC 13.2 (*) 4.0 - 10.5 K/uL   RBC 3.86 (*) 3.87 - 5.11 MIL/uL   Hemoglobin 11.2 (*) 12.0 - 15.0 g/dL   HCT 34.8 (*) 36.0 - 46.0 %   MCV 90.2  78.0 - 100.0 fL   MCH 29.0  26.0 - 34.0 pg   MCHC 32.2  30.0 - 36.0 g/dL   RDW 16.0 (*) 11.5 - 15.5 %   Platelets 376  150 - 400 K/uL   Neutrophils Relative % 69  43 - 77 %   Neutro Abs 9.1 (*) 1.7 - 7.7 K/uL   Lymphocytes Relative 21  12 - 46 %   Lymphs Abs 2.8  0.7 - 4.0 K/uL   Monocytes Relative 8  3 - 12 %   Monocytes Absolute 1.1 (*) 0.1 - 1.0 K/uL   Eosinophils Relative 2  0 - 5 %   Eosinophils Absolute 0.2  0.0 - 0.7 K/uL   Basophils Relative 0  0 - 1 %   Basophils Absolute 0.0  0.0 - 0.1 K/uL  GLUCOSE,  CAPILLARY      Result Value Ref Range   Glucose-Capillary 257 (*) 70 - 99 mg/dL  GLUCOSE, CAPILLARY  Result Value Ref Range   Glucose-Capillary 208 (*) 70 - 99 mg/dL  CBC WITH DIFFERENTIAL      Result Value Ref Range   WBC 8.6  4.0 - 10.5 K/uL   RBC 3.68 (*) 3.87 - 5.11 MIL/uL   Hemoglobin 10.7 (*) 12.0 - 15.0 g/dL   HCT 33.2 (*) 36.0 - 46.0 %   MCV 90.2  78.0 - 100.0 fL   MCH 29.1  26.0 - 34.0 pg   MCHC 32.2  30.0 - 36.0 g/dL   RDW 15.9 (*) 11.5 - 15.5 %   Platelets 302  150 - 400 K/uL   Neutrophils Relative % 66  43 - 77 %   Neutro Abs 5.7  1.7 - 7.7 K/uL   Lymphocytes Relative 22  12 - 46 %   Lymphs Abs 1.9  0.7 - 4.0 K/uL   Monocytes Relative 10  3 - 12 %   Monocytes Absolute 0.8  0.1 - 1.0 K/uL   Eosinophils Relative 3  0 - 5 %   Eosinophils Absolute 0.2  0.0 - 0.7 K/uL   Basophils Relative 0  0 - 1 %   Basophils Absolute 0.0  0.0 - 0.1 K/uL  GLUCOSE, CAPILLARY      Result Value Ref Range   Glucose-Capillary 243 (*) 70 - 99 mg/dL   Comment 1 Documented in Chart     Comment 2 Notify RN    GLUCOSE, CAPILLARY      Result Value Ref Range   Glucose-Capillary 168 (*) 70 - 99 mg/dL  CBG MONITORING, ED      Result Value Ref Range   Glucose-Capillary 164 (*) 70 - 99 mg/dL   Comment 1 Documented in Chart     Comment 2 Notify RN    POC OCCULT BLOOD, ED      Result Value Ref Range   Fecal Occult Bld NEGATIVE  NEGATIVE  I-STAT TROPOININ, ED      Result Value Ref Range   Troponin i, poc 0.01  0.00 - 0.08 ng/mL   Comment 3           I-STAT CHEM 8, ED      Result Value Ref Range   Sodium 133 (*) 137 - 147 mEq/L   Potassium 5.5 (*) 3.7 - 5.3 mEq/L   Chloride 99  96 - 112 mEq/L   BUN 97 (*) 6 - 23 mg/dL   Creatinine, Ser 6.20 (*) 0.50 - 1.10 mg/dL   Glucose, Bld 186 (*) 70 - 99 mg/dL   Calcium, Ion 1.21  1.13 - 1.30 mmol/L   TCO2 23  0 - 100 mmol/L   Hemoglobin 9.9 (*) 12.0 - 15.0 g/dL   HCT 29.0 (*) 36.0 - 46.0 %    Time coordinating discharge: 40  minutes  Signed: Henrine Screws, MD 07/23/2013, 4:46 PM

## 2013-07-24 LAB — GLUCOSE, CAPILLARY: GLUCOSE-CAPILLARY: 168 mg/dL — AB (ref 70–99)

## 2013-07-24 MED ORDER — SIMVASTATIN 10 MG PO TABS: 10.0000 mg | ORAL_TABLET | Freq: Every day | ORAL | Status: DC

## 2013-07-24 MED ORDER — ASPIRIN 325 MG PO TABS: 162.0000 mg | ORAL_TABLET | Freq: Every day | ORAL | Status: DC

## 2013-07-24 NOTE — Progress Notes (Signed)
Pt. DC'd via [facility] to [facility].  Vital signs and assessments were stable.

## 2013-07-24 NOTE — Clinical Social Work Note (Signed)
CSW has faxed discharge summary and updated FL2 with discharge medications to [facility]. CSW confirmed with admissions liaison at [facility] that their facility has received documents and are able to admit pt on 07/24/2013. CSW has completed discharge packet and placed on pt's shadow chart. CSW has arranged for ambulance transportation from [facility] to [facility]. Pt's RN aware of information above.  RN to please call report to [facility] at [phone] *If no answer, please call [phone]*  Pati Gallo, [place] Social Worker [phone]

## 2013-07-24 NOTE — Progress Notes (Signed)
Report called and given to [facility], RN at [facility]

## 2013-07-24 NOTE — Progress Notes (Signed)
Inpatient Diabetes Program Recommendations  AACE/ADA: New Consensus Statement on Inpatient Glycemic Control (2013)  Target Ranges:  Prepandial:   less than 140 mg/dL      Peak postprandial:   less than 180 mg/dL (1-2 hours)      Critically ill patients:  140 - 180 mg/dL     Results for BARBY, KANNING (MRN [id number]) as of 07/24/2013 09:45  Ref. Range 07/23/2013 08:02 07/23/2013 11:53 07/23/2013 16:47 07/23/2013 22:20  Glucose-Capillary Latest Range: 70-99 mg/dL 168 (H) 215 (H) 270 (H) 176 (H)     **PO intake poor (10-50% of meals)   **MD- Please increase Novolog SSI to Moderate scale (curently ordered as Sensitive scale)   Will follow. Wyn Quaker RN, MSN, CDE Diabetes Coordinator Inpatient Diabetes Program Team Pager: [phone] (8a-10p)

## 2013-07-24 NOTE — Progress Notes (Signed)
Subjective: Slept well last night. Blood pressure relatively well controlled. Ready for discharge to [facility]  Objective: Weight change:   Intake/Output Summary (Last 24 hours) at 07/24/13 0749 Last data filed at 07/23/13 2227  Gross per 24 hour  Intake    440 ml  Output      0 ml  Net    440 ml   Filed Vitals:   07/23/13 1950 07/23/13 2324 07/24/13 0200 07/24/13 0610  BP: 163/82 165/98 180/98 156/93  Pulse: 75 80 71 59  Temp:  98.2 F (36.8 C) 97.8 F (36.6 C) 97.6 F (36.4 C)  TempSrc:  Oral Oral Oral  Resp: 18 18 16    Height:      Weight:      SpO2: 100% 99% 95%     Lab Results: Results for orders placed during the hospital encounter of 07/20/13 (from the past 48 hour(s))  CBC WITH DIFFERENTIAL     Status: Abnormal   Collection Time    07/22/13  9:10 AM      Result Value Ref Range   WBC 13.2 (*) 4.0 - 10.5 K/uL   RBC 3.86 (*) 3.87 - 5.11 MIL/uL   Hemoglobin 11.2 (*) 12.0 - 15.0 g/dL   HCT 34.8 (*) 36.0 - 46.0 %   MCV 90.2  78.0 - 100.0 fL   MCH 29.0  26.0 - 34.0 pg   MCHC 32.2  30.0 - 36.0 g/dL   RDW 16.0 (*) 11.5 - 15.5 %   Platelets 376  150 - 400 K/uL   Neutrophils Relative % 69  43 - 77 %   Neutro Abs 9.1 (*) 1.7 - 7.7 K/uL   Lymphocytes Relative 21  12 - 46 %   Lymphs Abs 2.8  0.7 - 4.0 K/uL   Monocytes Relative 8  3 - 12 %   Monocytes Absolute 1.1 (*) 0.1 - 1.0 K/uL   Eosinophils Relative 2  0 - 5 %   Eosinophils Absolute 0.2  0.0 - 0.7 K/uL   Basophils Relative 0  0 - 1 %   Basophils Absolute 0.0  0.0 - 0.1 K/uL  GLUCOSE, CAPILLARY     Status: Abnormal   Collection Time    07/22/13 12:04 PM      Result Value Ref Range   Glucose-Capillary 257 (*) 70 - 99 mg/dL  GLUCOSE, CAPILLARY     Status: Abnormal   Collection Time    07/22/13  4:37 PM      Result Value Ref Range   Glucose-Capillary 208 (*) 70 - 99 mg/dL  GLUCOSE, CAPILLARY     Status: Abnormal   Collection Time    07/22/13  9:04 PM      Result Value Ref Range   Glucose-Capillary 243 (*) 70 - 99 mg/dL   Comment 1 Documented in Chart     Comment 2 Notify RN    CBC WITH DIFFERENTIAL     Status: Abnormal   Collection Time    07/23/13  4:45 AM      Result Value Ref Range   WBC 8.6  4.0 - 10.5 K/uL   RBC 3.68 (*) 3.87 - 5.11 MIL/uL   Hemoglobin 10.7 (*) 12.0 - 15.0 g/dL   HCT 33.2 (*) 36.0 - 46.0 %   MCV 90.2  78.0 - 100.0 fL   MCH 29.1  26.0 - 34.0 pg   MCHC 32.2  30.0 - 36.0 g/dL   RDW 15.9 (*) 11.5 - 15.5 %  Platelets 302  150 - 400 K/uL   Neutrophils Relative % 66  43 - 77 %   Neutro Abs 5.7  1.7 - 7.7 K/uL   Lymphocytes Relative 22  12 - 46 %   Lymphs Abs 1.9  0.7 - 4.0 K/uL   Monocytes Relative 10  3 - 12 %   Monocytes Absolute 0.8  0.1 - 1.0 K/uL   Eosinophils Relative 3  0 - 5 %   Eosinophils Absolute 0.2  0.0 - 0.7 K/uL   Basophils Relative 0  0 - 1 %   Basophils Absolute 0.0  0.0 - 0.1 K/uL  GLUCOSE, CAPILLARY     Status: Abnormal   Collection Time    07/23/13  8:02 AM      Result Value Ref Range   Glucose-Capillary 168 (*) 70 - 99 mg/dL  GLUCOSE, CAPILLARY     Status: Abnormal   Collection Time    07/23/13 11:53 AM      Result Value Ref Range   Glucose-Capillary 215 (*) 70 - 99 mg/dL  GLUCOSE, CAPILLARY     Status: Abnormal   Collection Time    07/23/13  4:47 PM      Result Value Ref Range   Glucose-Capillary 270 (*) 70 - 99 mg/dL   Comment 1 Documented in Chart     Comment 2 Notify RN    GLUCOSE, CAPILLARY     Status: Abnormal   Collection Time    07/23/13 10:20 PM      Result Value Ref Range   Glucose-Capillary 176 (*) 70 - 99 mg/dL   Comment 1 Documented in Chart     Comment 2 Notify RN    GLUCOSE, CAPILLARY     Status: Abnormal   Collection Time    07/24/13  6:41 AM      Result Value Ref Range   Glucose-Capillary 168 (*) 70 - 99 mg/dL    Studies/Results: US Renal  07/22/2013   CLINICAL DATA Hypertension and diabetes.  EXAM RENAL/URINARY TRACT ULTRASOUND COMPLETE  COMPARISON None.  FINDINGS Right Kidney:   Length: 10.2 cm. Echogenicity within normal limits. No mass or hydronephrosis visualized.  Left Kidney:  Length: 10.2 cm. Echogenicity within normal limits. No mass or hydronephrosis visualized.  Bladder:  Appears normal for degree of bladder distention.  IMPRESSION Normal exam.  SIGNATURE  Electronically Signed   By: Marcello Moores  Register   On: 07/22/2013 12:03   Medications: Scheduled Meds: . allopurinol  150 mg Oral QHS  . amLODipine  5 mg Oral Daily  . aspirin  300 mg Rectal Daily   Or  . aspirin  325 mg Oral Daily  . bisoprolol  10 mg Oral Daily  . cholecalciferol  8,000 Units Oral Daily  . cloNIDine  0.1 mg Oral BID  . enoxaparin (LOVENOX) injection  30 mg Subcutaneous Q24H  . insulin aspart  0-5 Units Subcutaneous QHS  . insulin aspart  0-9 Units Subcutaneous TID WC  . insulin glargine  30 Units Subcutaneous Daily  . omega-3 acid ethyl esters  1 g Oral BID  . polyvinyl alcohol  1 drop Both Eyes BID  . traZODone  50 mg Oral QHS   Continuous Infusions:  PRN Meds:.acetaminophen, acetaminophen, senna-docusate  Assessment/Plan: Active Problems:   Renal failure, acute on chronic   Dehydration with hyponatremia   Anemia   Acute encephalopathy   Stroke   Hypertension   Diabetes type 2, uncontrolled   Diarrhea    LOS: 4  days   Henrine Screws, MD 07/24/2013, 7:49 AM

## 2013-07-29 ENCOUNTER — Inpatient Hospital Stay (HOSPITAL_COMMUNITY)
Admission: EM | Admit: 2013-07-29 | Discharge: 2013-08-03 | DRG: 377 | Disposition: A | Discharge status: Skilled Nursing Facility | Payer: Medicare Other | Attending: Internal Medicine | Admitting: Internal Medicine

## 2013-07-29 ENCOUNTER — Emergency Department (HOSPITAL_COMMUNITY): Payer: Medicare Other

## 2013-07-29 ENCOUNTER — Encounter (HOSPITAL_COMMUNITY): Payer: Self-pay | Admitting: Emergency Medicine

## 2013-07-29 DIAGNOSIS — Z794 Long term (current) use of insulin: Secondary | ICD-10-CM

## 2013-07-29 DIAGNOSIS — G9340 Encephalopathy, unspecified: Secondary | ICD-10-CM | POA: Diagnosis present

## 2013-07-29 DIAGNOSIS — I959 Hypotension, unspecified: Secondary | ICD-10-CM

## 2013-07-29 DIAGNOSIS — K922 Gastrointestinal hemorrhage, unspecified: Principal | ICD-10-CM | POA: Diagnosis present

## 2013-07-29 DIAGNOSIS — I10 Essential (primary) hypertension: Secondary | ICD-10-CM | POA: Diagnosis present

## 2013-07-29 DIAGNOSIS — E1122 Type 2 diabetes mellitus with diabetic chronic kidney disease: Secondary | ICD-10-CM | POA: Diagnosis present

## 2013-07-29 DIAGNOSIS — G4710 Hypersomnia, unspecified: Secondary | ICD-10-CM | POA: Diagnosis present

## 2013-07-29 DIAGNOSIS — N184 Chronic kidney disease, stage 4 (severe): Secondary | ICD-10-CM

## 2013-07-29 DIAGNOSIS — N179 Acute kidney failure, unspecified: Secondary | ICD-10-CM | POA: Diagnosis present

## 2013-07-29 DIAGNOSIS — I639 Cerebral infarction, unspecified: Secondary | ICD-10-CM | POA: Diagnosis present

## 2013-07-29 DIAGNOSIS — R4181 Age-related cognitive decline: Secondary | ICD-10-CM | POA: Diagnosis present

## 2013-07-29 DIAGNOSIS — E1165 Type 2 diabetes mellitus with hyperglycemia: Secondary | ICD-10-CM

## 2013-07-29 DIAGNOSIS — Z87891 Personal history of nicotine dependence: Secondary | ICD-10-CM

## 2013-07-29 DIAGNOSIS — I69922 Dysarthria following unspecified cerebrovascular disease: Secondary | ICD-10-CM

## 2013-07-29 DIAGNOSIS — IMO0001 Reserved for inherently not codable concepts without codable children: Secondary | ICD-10-CM | POA: Diagnosis present

## 2013-07-29 DIAGNOSIS — M109 Gout, unspecified: Secondary | ICD-10-CM | POA: Diagnosis present

## 2013-07-29 DIAGNOSIS — G9349 Other encephalopathy: Secondary | ICD-10-CM | POA: Diagnosis present

## 2013-07-29 DIAGNOSIS — I69959 Hemiplegia and hemiparesis following unspecified cerebrovascular disease affecting unspecified side: Secondary | ICD-10-CM

## 2013-07-29 DIAGNOSIS — R4182 Altered mental status, unspecified: Secondary | ICD-10-CM

## 2013-07-29 DIAGNOSIS — J9600 Acute respiratory failure, unspecified whether with hypoxia or hypercapnia: Secondary | ICD-10-CM | POA: Diagnosis present

## 2013-07-29 DIAGNOSIS — Z6825 Body mass index (BMI) 25.0-25.9, adult: Secondary | ICD-10-CM

## 2013-07-29 DIAGNOSIS — R3989 Other symptoms and signs involving the genitourinary system: Secondary | ICD-10-CM | POA: Diagnosis present

## 2013-07-29 DIAGNOSIS — Z923 Personal history of irradiation: Secondary | ICD-10-CM

## 2013-07-29 DIAGNOSIS — J690 Pneumonitis due to inhalation of food and vomit: Secondary | ICD-10-CM

## 2013-07-29 DIAGNOSIS — D500 Iron deficiency anemia secondary to blood loss (chronic): Secondary | ICD-10-CM | POA: Diagnosis present

## 2013-07-29 DIAGNOSIS — J9690 Respiratory failure, unspecified, unspecified whether with hypoxia or hypercapnia: Secondary | ICD-10-CM

## 2013-07-29 DIAGNOSIS — Z66 Do not resuscitate: Secondary | ICD-10-CM | POA: Diagnosis present

## 2013-07-29 DIAGNOSIS — Z8249 Family history of ischemic heart disease and other diseases of the circulatory system: Secondary | ICD-10-CM

## 2013-07-29 DIAGNOSIS — Z79899 Other long term (current) drug therapy: Secondary | ICD-10-CM

## 2013-07-29 DIAGNOSIS — Z880 Allergy status to penicillin: Secondary | ICD-10-CM

## 2013-07-29 DIAGNOSIS — Z8542 Personal history of malignant neoplasm of other parts of uterus: Secondary | ICD-10-CM

## 2013-07-29 DIAGNOSIS — D649 Anemia, unspecified: Secondary | ICD-10-CM

## 2013-07-29 DIAGNOSIS — D62 Acute posthemorrhagic anemia: Secondary | ICD-10-CM | POA: Diagnosis present

## 2013-07-29 HISTORY — DX: Disorder of kidney and ureter, unspecified: N28.9

## 2013-07-29 LAB — BASIC METABOLIC PANEL
BUN: 118 mg/dL — ABNORMAL HIGH (ref 6–23)
CHLORIDE: 92 meq/L — AB (ref 96–112)
CO2: 24 mEq/L (ref 19–32)
CREATININE: 1.91 mg/dL — AB (ref 0.50–1.10)
Calcium: 10.3 mg/dL (ref 8.4–10.5)
GFR calc non Af Amer: 23 mL/min — ABNORMAL LOW (ref >90)
GFR, EST AFRICAN AMERICAN: 27 mL/min — AB (ref >90)
Glucose, Bld: 135 mg/dL — ABNORMAL HIGH (ref 70–99)
Potassium: 4 mEq/L (ref 3.7–5.3)
Sodium: 134 mEq/L — ABNORMAL LOW (ref 137–147)

## 2013-07-29 LAB — URINALYSIS, ROUTINE W REFLEX MICROSCOPIC
Bilirubin Urine: NEGATIVE
GLUCOSE, UA: NEGATIVE mg/dL
Hgb urine dipstick: NEGATIVE
Ketones, ur: NEGATIVE mg/dL
Leukocytes, UA: NEGATIVE
Nitrite: NEGATIVE
PROTEIN: NEGATIVE mg/dL
SPECIFIC GRAVITY, URINE: 1.015 (ref 1.005–1.030)
Urobilinogen, UA: 0.2 mg/dL (ref 0.0–1.0)
pH: 5 (ref 5.0–8.0)

## 2013-07-29 LAB — CBC WITH DIFFERENTIAL/PLATELET
BASOS PCT: 0 % (ref 0–1)
Basophils Absolute: 0 10*3/uL (ref 0.0–0.1)
EOS PCT: 1 % (ref 0–5)
Eosinophils Absolute: 0.2 10*3/uL (ref 0.0–0.7)
HEMATOCRIT: 17.6 % — AB (ref 36.0–46.0)
HEMOGLOBIN: 5.7 g/dL — AB (ref 12.0–15.0)
Lymphocytes Relative: 16 % (ref 12–46)
Lymphs Abs: 1.9 10*3/uL (ref 0.7–4.0)
MCH: 28.5 pg (ref 26.0–34.0)
MCHC: 32.4 g/dL (ref 30.0–36.0)
MCV: 88 fL (ref 78.0–100.0)
MONO ABS: 0.7 10*3/uL (ref 0.1–1.0)
Monocytes Relative: 6 % (ref 3–12)
Neutro Abs: 8.8 10*3/uL — ABNORMAL HIGH (ref 1.7–7.7)
Neutrophils Relative %: 76 % (ref 43–77)
Platelets: 303 10*3/uL (ref 150–400)
RBC: 2 MIL/uL — ABNORMAL LOW (ref 3.87–5.11)
RDW: 16.4 % — AB (ref 11.5–15.5)
WBC: 11.6 10*3/uL — ABNORMAL HIGH (ref 4.0–10.5)

## 2013-07-29 LAB — POC OCCULT BLOOD, ED: Fecal Occult Bld: POSITIVE — AB

## 2013-07-29 LAB — PRO B NATRIURETIC PEPTIDE: Pro B Natriuretic peptide (BNP): 512.7 pg/mL — ABNORMAL HIGH (ref 0–450)

## 2013-07-29 LAB — HEMOGLOBIN AND HEMATOCRIT, BLOOD
HEMATOCRIT: 17.5 % — AB (ref 36.0–46.0)
HEMOGLOBIN: 5.7 g/dL — AB (ref 12.0–15.0)

## 2013-07-29 LAB — PROTIME-INR
INR: 1.09 (ref 0.00–1.49)
PROTHROMBIN TIME: 13.9 s (ref 11.6–15.2)

## 2013-07-29 LAB — TROPONIN I: Troponin I: <0.3 ng/mL (ref <0.30)

## 2013-07-29 LAB — MRSA PCR SCREENING: MRSA by PCR: NEGATIVE

## 2013-07-29 LAB — APTT: aPTT: 28 seconds (ref 24–37)

## 2013-07-29 LAB — LACTIC ACID, PLASMA: Lactic Acid, Venous: 2.3 mmol/L — ABNORMAL HIGH (ref 0.5–2.2)

## 2013-07-29 LAB — PREPARE RBC (CROSSMATCH)

## 2013-07-29 LAB — ABO/RH: ABO/RH(D): A NEG

## 2013-07-29 MED ORDER — ASPIRIN 325 MG PO TABS
162.0000 mg | ORAL_TABLET | Freq: Every day | ORAL | Status: DC
  Filled 2013-07-29: qty 0.5

## 2013-07-29 MED ORDER — SODIUM CHLORIDE 0.9 % IV BOLUS (SEPSIS)
250.0000 mL | Freq: Once | INTRAVENOUS | Status: AC
  Administered 2013-07-29: 250 mL via INTRAVENOUS

## 2013-07-29 MED ORDER — SODIUM CHLORIDE 0.9 % IJ SOLN
3.0000 mL | INTRAMUSCULAR | Status: DC | PRN
  Administered 2013-08-01: 3 mL via INTRAVENOUS

## 2013-07-29 MED ORDER — ALBUTEROL SULFATE (2.5 MG/3ML) 0.083% IN NEBU: 2.5000 mg | INHALATION_SOLUTION | RESPIRATORY_TRACT | Status: DC | PRN

## 2013-07-29 MED ORDER — SIMVASTATIN 10 MG PO TABS
10.0000 mg | ORAL_TABLET | Freq: Every day | ORAL | Status: DC
  Administered 2013-07-29 – 2013-08-02 (×5): 10 mg via ORAL
  Filled 2013-07-29 (×6): qty 1

## 2013-07-29 MED ORDER — CARBOXYMETHYLCELLULOSE SODIUM 0.5 % OP SOLN: 1.0000 [drp] | Freq: Two times a day (BID) | OPHTHALMIC | Status: DC

## 2013-07-29 MED ORDER — INSULIN GLARGINE 100 UNIT/ML ~~LOC~~ SOLN
40.0000 [IU] | SUBCUTANEOUS | Status: DC
  Administered 2013-07-30 – 2013-08-03 (×5): 40 [IU] via SUBCUTANEOUS
  Filled 2013-07-29 (×5): qty 0.4

## 2013-07-29 MED ORDER — INSULIN ASPART 100 UNIT/ML ~~LOC~~ SOLN
0.0000 [IU] | Freq: Three times a day (TID) | SUBCUTANEOUS | Status: DC
  Administered 2013-07-30 (×2): 3 [IU] via SUBCUTANEOUS

## 2013-07-29 MED ORDER — SODIUM CHLORIDE 0.9 % IJ SOLN
3.0000 mL | Freq: Two times a day (BID) | INTRAMUSCULAR | Status: DC
  Administered 2013-07-29 – 2013-08-02 (×5): 3 mL via INTRAVENOUS

## 2013-07-29 MED ORDER — VANCOMYCIN HCL IN DEXTROSE 1-5 GM/200ML-% IV SOLN: 1000.0000 mg | INTRAVENOUS | Status: DC

## 2013-07-29 MED ORDER — FUROSEMIDE 10 MG/ML IJ SOLN
20.0000 mg | Freq: Once | INTRAMUSCULAR | Status: AC
  Administered 2013-07-29: 20 mg via INTRAVENOUS
  Filled 2013-07-29: qty 2

## 2013-07-29 MED ORDER — CALCIUM CARBONATE ANTACID 500 MG PO CHEW
1.0000 | CHEWABLE_TABLET | Freq: Three times a day (TID) | ORAL | Status: DC | PRN
  Administered 2013-07-29: 200 mg via ORAL
  Filled 2013-07-29: qty 3

## 2013-07-29 MED ORDER — LEVOFLOXACIN IN D5W 750 MG/150ML IV SOLN
750.0000 mg | Freq: Once | INTRAVENOUS | Status: AC
  Administered 2013-07-29: 750 mg via INTRAVENOUS
  Filled 2013-07-29: qty 150

## 2013-07-29 MED ORDER — VANCOMYCIN HCL IN DEXTROSE 1-5 GM/200ML-% IV SOLN
1000.0000 mg | Freq: Once | INTRAVENOUS | Status: AC
  Administered 2013-07-29: 1000 mg via INTRAVENOUS
  Filled 2013-07-29: qty 200

## 2013-07-29 MED ORDER — POLYVINYL ALCOHOL 1.4 % OP SOLN
1.0000 [drp] | Freq: Two times a day (BID) | OPHTHALMIC | Status: DC
  Administered 2013-07-29 – 2013-08-03 (×9): 1 [drp] via OPHTHALMIC
  Filled 2013-07-29 (×2): qty 15

## 2013-07-29 MED ORDER — SODIUM CHLORIDE 0.9 % IV SOLN: 250.0000 mL | INTRAVENOUS | Status: DC | PRN

## 2013-07-29 NOTE — ED Notes (Signed)
Pt O2 sats noted in the mid 80's on 3L. Talked with EDP and orders to placed on Bipap. RT called for machine

## 2013-07-29 NOTE — ED Notes (Signed)
Pt arrives via EMS from [facility] [address]. Staff called EMS after finding the pt unresponsive this morning. Ssaff states that pt is normally conscious and alert . States that she was her normal all day yesterday and started having slight changes in mental status after dinner. Upon EMS arrival pt was unresponsive, lungs were rhoncus, congested cough present. BP 142/61 HR 68 90% on RA, placed on 3L [place] 98%, 97.5 tympanic, RR 16, CBG 155. 12 lead NSR. Staff states pt was in hospital recently and diagnosed with kidney failure. DNR present.   Pt 81% on RA in the ED. Placed on 3L [place] now 99%

## 2013-07-29 NOTE — ED Notes (Signed)
Report called to [facility]. Waiting on Respiratory for transport.

## 2013-07-29 NOTE — Progress Notes (Signed)
Placed on nasal canula by Resp therapist. Pt tolerating well 02 sats 98 %

## 2013-07-29 NOTE — ED Notes (Signed)
Phlebotomy at the bedside  

## 2013-07-29 NOTE — ED Notes (Signed)
Pt now on Bi-pap, tolerating well. O2 sats at 99%. No distress noted.

## 2013-07-29 NOTE — Progress Notes (Signed)
ANTIBIOTIC CONSULT NOTE - INITIAL  Pharmacy Consult for vancomycin Indication: pneumonia  Allergies  Allergen Reactions  . Penicillins Other (See Comments)    unknown    Patient Measurements: Height: 4\' 11"  (149.9 cm) Weight: 126 lb 12.2 oz (57.5 kg) IBW/kg (Calculated) : 43.2  Vital Signs: Temp: 98.1 F (36.7 C) (03/18 1100) Temp src: Oral (03/18 0701) BP: 138/42 mmHg (03/18 1130) Pulse Rate: 73 (03/18 1130) Intake/Output from previous day:   Intake/Output from this shift: Total I/O In: 302.5 [Blood:302.5] Out: -   Labs:  Recent Labs  07/29/13 0804 07/29/13 0930  WBC 11.6*  --   HGB 5.7* 5.7*  PLT 303  --   CREATININE 1.91*  --    Estimated Creatinine Clearance: 17.5 ml/min (by C-G formula based on Cr of 1.91). No results found for this basename: VANCOTROUGH, VANCOPEAK, VANCORANDOM, GENTTROUGH, GENTPEAK, GENTRANDOM, TOBRATROUGH, TOBRAPEAK, TOBRARND, AMIKACINPEAK, AMIKACINTROU, AMIKACIN,  in the last 72 hours   Microbiology: No results found for this or any previous visit (from the past 720 hour(s)).  Medical History: Past Medical History  Diagnosis Date  . Diabetes mellitus   . Hypertension   . Gout   . Pneumonia   . Stroke     L sided deficits w/slurred speech  . Cancer 07/04/10    endometrial  . Falling episodes   . Hx of radiation therapy 09/13/10- 10/19/10, 6/12, 6/19, 11/14/10    pelvis, external beam tehn brachytherapy  . Renal disorder     Assessment: [name] from [facility] admitted after being found unresponsive by staff this morning. WBC elevated at 11.6, lactic acid elevated at 2.3. tmax 99.1 in the ED. Levofloxacin 750mg  IV x1 has been ordered by EDP. SCr 1.91 with est CrCL ~31mL/min. No noted history of HD.  Goal of Therapy:  Vancomycin trough level 15-20 mcg/ml  Plan:  1. Vancomycin 1000mg  (~20mg /kg) IV x1 as a loading dose 2. Vancomycin 1000mg  IV q48h starting 3/20 3. Follow renal function, clinical progression, LOT, trough at SS 4.  Will follow for other maintenance antibiotic orders  Milas Schappell D. Kenard Morawski, PharmD, BCPS Clinical Pharmacist Pager: [phone] 07/29/2013 11:42 AM

## 2013-07-29 NOTE — H&P (Signed)
[facility] History and Physical  [name] [id number] DOB: December 07, 1928 DOA: 07/29/2013  Referring physician: Dr. Betsey Holiday PCP: Henrine Screws, MD   Chief Complaint: Altered mental status and anemia with GI source  HPI: [name] is a 80 y.o. 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  Upon evaluation in the ED patient was found to be hypoxic although chest x-ray was negative for pneumonia. Patient was also found to have a hemoglobin of 5.5 with guiaic positive stools and as such we were consulted for further evaluation and recommendations. While patient was in the ED she was placed on BiPAP and reportedly her mentation has improved.    Review of Systems:  Unable to accurately assess due to alteration in mental status  Past Medical History  Diagnosis Date  . Diabetes mellitus   . Hypertension   . Gout   . Pneumonia   . Stroke     L sided deficits w/slurred speech  . Cancer 07/04/10    endometrial  . Falling episodes   . Hx of radiation therapy 09/13/10- 10/19/10, 6/12, 6/19, 11/14/10    pelvis, external beam tehn brachytherapy  . Renal disorder    Past Surgical History  Procedure Laterality Date  . Abdominal hysterectomy     Social History:  reports that she has quit smoking. Her smoking use included Cigarettes. She smoked 0.00 packs per day. She does not have any smokeless tobacco history on file. She reports that she does not drink alcohol. Her drug history is not on file.  Allergies  Allergen Reactions  . Penicillins Other (See Comments)    unknown    Family History  Problem Relation Age of Onset  . Hypertension Mother   . Hypertension Father      Prior to Admission medications     Medication Sig Start Date End Date Taking? Authorizing Provider  allopurinol (ZYLOPRIM) 100 MG tablet Take 150 mg by mouth at bedtime.   Yes Historical Provider, MD  amLODipine (NORVASC) 5 MG tablet Take 5 mg by mouth daily.    Yes Historical Provider, MD  aspirin 325 MG tablet Take 0.5 tablets (162 mg total) by mouth daily. 07/24/13  Yes Josetta Huddle, MD  bisoprolol (ZEBETA) 10 MG tablet Take 1 tablet (10 mg total) by mouth daily. 07/23/13  Yes Josetta Huddle, MD  carboxymethylcellulose (REFRESH PLUS) 0.5 % SOLN Place 1 drop into both eyes 2 (two) times daily.   Yes Historical Provider, MD  Cholecalciferol (HM VITAMIN D3) 4000 UNITS CAPS Take 8,000 Units by mouth daily.   Yes Historical Provider, MD  Cyanocobalamin (VITAMIN B 12 PO) Take 1 tablet by mouth daily.    Yes Historical Provider, MD  insulin aspart (NOVOLOG) 100 UNIT/ML injection Inject 0-12 Units into the skin 2 (two) times daily. Sliding scale <150=0 units 151-200=4 units 201-250=6 units 251-300=8 units 301-350=10 units 351-400=12 units   Yes Historical Provider, MD  Insulin Glargine (LANTUS SOLOSTAR) 100 UNIT/ML Solostar Pen Inject 40 Units into the skin every morning. 07/23/13  Yes Josetta Huddle, MD  LORazepam (ATIVAN) 0.5 MG tablet Take 0.5 mg by mouth every 6 (six) hours as needed for anxiety.   Yes Historical Provider, MD  losartan (COZAAR) 100 MG tablet Take 1 tablet (100 mg total) by mouth daily.  07/23/13  Yes Josetta Huddle, MD  meloxicam (MOBIC) 7.5 MG tablet Take 7.5 mg by mouth daily.   Yes Historical Provider, MD  promethazine (PHENERGAN) 25 MG suppository Place 25 mg rectally every 6 (six) hours as needed for nausea.   Yes Historical Provider, MD  promethazine (PHENERGAN) 25 MG tablet Take 25 mg by mouth every 6 (six) hours as needed for nausea.   Yes Historical Provider, MD  simvastatin (ZOCOR) 10 MG tablet Take 1 tablet (10 mg total) by mouth at bedtime. 07/24/13  Yes Josetta Huddle, MD  traZODone (DESYREL) 50 MG tablet Take 50 mg  by mouth at bedtime.   Yes Historical Provider, MD   Physical Exam: Filed Vitals:   07/29/13 1130  BP: 138/42  Pulse: 73  Temp:   Resp: 22    BP 138/42  Pulse 73  Temp(Src) 98.1 F (36.7 C) (Oral)  Resp 22  Ht 4\' 11"  (1.499 m)  Wt 57.5 kg (126 lb 12.2 oz)  BMI 25.59 kg/m2  SpO2 100%  General:  Alert and awake, Oriented to person and place Eyes: PERRL, normal lids, irises & conjunctiva ENT: grossly normal hearing, lips & tongue Neck: no LAD, masses or thyromegaly Cardiovascular: RRR, no m/r/g. No LE edema. Telemetry: SR, no arrhythmias  Respiratory: CTA bilaterally, no w/r/r. Normal respiratory effort. Abdomen: soft, ntnd Skin: no rash or induration seen on limited exam Musculoskeletal: no cyanosis or clubbing Psychiatric: Difficult exam due to limited patient cooperation Neurologic: answers questions, no facial asymmetry          Labs on Admission:  Basic Metabolic Panel:  Recent Labs Lab 07/29/13 0804  NA 134*  K 4.0  CL 92*  CO2 24  GLUCOSE 135*  BUN 118*  CREATININE 1.91*  CALCIUM 10.3   Liver Function Tests: No results found for this basename: AST, ALT, ALKPHOS, BILITOT, PROT, ALBUMIN,  in the last 168 hours No results found for this basename: LIPASE, AMYLASE,  in the last 168 hours No results found for this basename: AMMONIA,  in the last 168 hours CBC:  Recent Labs Lab 07/23/13 0445 07/29/13 0804 07/29/13 0930  WBC 8.6 11.6*  --   NEUTROABS 5.7 8.8*  --   HGB 10.7* 5.7* 5.7*  HCT 33.2* 17.6* 17.5*  MCV 90.2 88.0  --   PLT 302 303  --    Cardiac Enzymes:  Recent Labs Lab 07/29/13 0804  TROPONINI <0.30    BNP (last 3 results)  Recent Labs  07/29/13 0804  PROBNP 512.7*   CBG:  Recent Labs Lab 07/23/13 0802 07/23/13 1153 07/23/13 1647 07/23/13 2220 07/24/13 0641  GLUCAP 168* 215* 270* 176* 168*    Radiological Exams on Admission: Ct Head Wo Contrast  07/29/2013   CLINICAL DATA:  Altered mental status  EXAM: CT HEAD  WITHOUT CONTRAST  TECHNIQUE: Contiguous axial images were obtained from the base of the skull through the vertex without intravenous contrast. Study was obtained within 24 hr of patient's arrival at the emergency department.  COMPARISON:  Brain MRI and brain CT July 20, 2013  FINDINGS: Moderate diffuse atrophy is stable. There is no mass, hemorrhage, extra-axial fluid collection, or midline shift. There is small vessel disease throughout the centra semiovale bilaterally, stable. There is a prior small lacunar infarct in the head of the caudate nucleus on the right. There is no new gray-white compartment lesion. There is no demonstrable acute infarct.  The bony calvarium appears intact. The mastoid air cells are clear. There is  opacification in the left maxillary antrum. Opacity extends into the left naris. Question mucocele given this appearance. There is calcification within portions of this opacification in the left maxillary antrum.  IMPRESSION: Atrophy with periventricular small vessel disease, stable. Small prior lacunar infarct right caudate nucleus head. No intracranial mass, hemorrhage, or acute infarct.  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 Calcification this area may represent superimposed chronic fungal infection.   Electronically Signed   By: Lowella Grip M.D.   On: 07/29/2013 07:39   Dg Chest Port 1 View  07/29/2013   CLINICAL DATA:  Short of breath  EXAM: PORTABLE CHEST - 1 VIEW  COMPARISON:  07/20/2013  FINDINGS: The heart size and mediastinal contours are within normal limits. Both lungs are clear. The visualized skeletal structures are unremarkable.  IMPRESSION: No active disease.   Electronically Signed   By: Franchot Gallo M.D.   On: 07/29/2013 07:21    EKG: Independently reviewed. Last EKG showed normal sinus rhythm with no ST elevations or depressions.  Assessment/Plan Active Problems:   GI  bleed/Anemia associated with acute blood loss - Will continue to monitor CBCs - ED physician to consult GI per our discussion  Altered mental status - Multifactorial most likely secondary to profound anemia associated with shortness of breath. Currently patient's mental status improved with transfusion and BiPAP administration.  DM 2 - Diabetic diet - Continue Lantus and SSI  HTN - Given anemia and low blood pressures we'll hold blood pressure medication  Gout - Hold allopurinol at this point.  H/o Stroke - Continue statin and aspirin. - CT of head negative for new stroke this admission.   Code Status: Full code Family Communication: No family at bedside. Disposition Plan: Stepdown  Time spent: > 35 minutes  Velvet Bathe [facility] Pager [phone]

## 2013-07-29 NOTE — Progress Notes (Signed)
Unit CM UR Completed by [facility] ED CM  W. Anju Sereno RN  

## 2013-07-29 NOTE — ED Provider Notes (Addendum)
CSN: [id number]     Arrival date & time 07/29/13  0654 History   First MD Initiated Contact with Patient 07/29/13 [id number]     Chief Complaint  Patient presents with  . Altered Mental Status     (Consider location/radiation/quality/duration/timing/severity/associated sxs/prior Treatment) HPI Comments: Patient brought to the emergency department from nursing home unresponsive. She was reportedly her normal self yesterday up until around dinnertime yesterday. They started to notice some effusion after that. This morning when the morning shift check on her they could not wake her up. Patient brought to the ER by EMS. She was reportedly 90% on room air when they arrived, placed on oxygen. Level V Caveat due to unresponsiveness.  Patient is a 80 y.o. female presenting with altered mental status.  Altered Mental Status   Past Medical History  Diagnosis Date  . Diabetes mellitus   . Hypertension   . Gout   . Pneumonia   . Stroke     L sided deficits w/slurred speech  . Cancer 07/04/10    endometrial  . Falling episodes   . Hx of radiation therapy 09/13/10- 10/19/10, 6/12, 6/19, 11/14/10    pelvis, external beam tehn brachytherapy  . Renal disorder    Past Surgical History  Procedure Laterality Date  . Abdominal hysterectomy     Family History  Problem Relation Age of Onset  . Hypertension Mother   . Hypertension Father    History  Substance Use Topics  . Smoking status: Former Smoker    Types: Cigarettes  . Smokeless tobacco: Not on file     Comment: quit many yrs ago  . Alcohol Use: No   OB History   Grav Para Term Preterm Abortions TAB SAB Ect Mult Living                 Review of Systems  Unable to perform ROS: Patient unresponsive      Allergies  Penicillins  Home Medications   Current Outpatient Rx  Name  Route  Sig  Dispense  Refill  . allopurinol (ZYLOPRIM) 100 MG tablet   Oral   Take 150 mg by mouth at bedtime.         Marland Kitchen amLODipine (NORVASC) 5 MG tablet    Oral   Take 5 mg by mouth daily.          Marland Kitchen aspirin 325 MG tablet   Oral   Take 0.5 tablets (162 mg total) by mouth daily.   30 tablet   5   . bisoprolol (ZEBETA) 10 MG tablet   Oral   Take 1 tablet (10 mg total) by mouth daily.   30 tablet   5   . carboxymethylcellulose (REFRESH PLUS) 0.5 % SOLN   Both Eyes   Place 1 drop into both eyes 2 (two) times daily.         . Cholecalciferol (HM VITAMIN D3) 4000 UNITS CAPS   Oral   Take 8,000 Units by mouth daily.         . Cyanocobalamin (VITAMIN B 12 PO)   Oral   Take 1 tablet by mouth daily.          . insulin aspart (NOVOLOG) 100 UNIT/ML injection   Subcutaneous   Inject 0-12 Units into the skin 2 (two) times daily. Sliding scale <150=0 units 151-200=4 units 201-250=6 units 251-300=8 units 301-350=10 units 351-400=12 units         . Insulin Glargine (LANTUS SOLOSTAR) 100 UNIT/ML Solostar Pen  Subcutaneous   Inject 40 Units into the skin every morning.   15 mL   11   . LORazepam (ATIVAN) 0.5 MG tablet   Oral   Take 0.5 mg by mouth every 6 (six) hours as needed for anxiety.         Marland Kitchen losartan (COZAAR) 100 MG tablet   Oral   Take 1 tablet (100 mg total) by mouth daily.   30 tablet   5   . meloxicam (MOBIC) 7.5 MG tablet   Oral   Take 7.5 mg by mouth daily.         . promethazine (PHENERGAN) 25 MG suppository   Rectal   Place 25 mg rectally every 6 (six) hours as needed for nausea.         . promethazine (PHENERGAN) 25 MG tablet   Oral   Take 25 mg by mouth every 6 (six) hours as needed for nausea.         . simvastatin (ZOCOR) 10 MG tablet   Oral   Take 1 tablet (10 mg total) by mouth at bedtime.   30 tablet   5   . traZODone (DESYREL) 50 MG tablet   Oral   Take 50 mg by mouth at bedtime.          BP 107/35  Pulse 64  Temp(Src) 98.1 F (36.7 C) (Oral)  Resp 21  SpO2 96% Physical Exam  Constitutional: She appears well-developed and well-nourished. No distress.  HENT:  Head:  Normocephalic and atraumatic.  Right Ear: Hearing normal.  Left Ear: Hearing normal.  Nose: Nose normal.  Mouth/Throat: Oropharynx is clear and moist and mucous membranes are normal.  Eyes: Conjunctivae and EOM are normal. Pupils are equal, round, and reactive to light.  Neck: Normal range of motion. Neck supple.  Cardiovascular: Regular rhythm, S1 normal and S2 normal.  Exam reveals no gallop and no friction rub.   No murmur heard. Pulmonary/Chest: Effort normal. No respiratory distress. She has decreased breath sounds. She has rhonchi. She has rales. She exhibits no tenderness.  Abdominal: Soft. Normal appearance and bowel sounds are normal. There is no hepatosplenomegaly. There is no tenderness. There is no rebound, no guarding, no tenderness at McBurney's point and negative Murphy's sign. No hernia.  Musculoskeletal: Normal range of motion.  Neurological: She is unresponsive. No cranial nerve deficit. GCS eye subscore is 1. GCS verbal subscore is 1. GCS motor subscore is 4.  Skin: Skin is warm, dry and intact. No rash noted. No cyanosis.  Psychiatric: She is noncommunicative.    ED Course  Procedures (including critical care time) Labs Review Labs Reviewed  CBC WITH DIFFERENTIAL - Abnormal; Notable for the following:    WBC 11.6 (*)    RBC 2.00 (*)    Hemoglobin 5.7 (*)    HCT 17.6 (*)    RDW 16.4 (*)    Neutro Abs 8.8 (*)    All other components within normal limits  BASIC METABOLIC PANEL - Abnormal; Notable for the following:    Sodium 134 (*)    Chloride 92 (*)    Glucose, Bld 135 (*)    BUN 118 (*)    Creatinine, Ser 1.91 (*)    GFR calc non Af Amer 23 (*)    GFR calc Af Amer 27 (*)    All other components within normal limits  PRO B NATRIURETIC PEPTIDE - Abnormal; Notable for the following:    Pro B Natriuretic peptide (BNP) 512.7 (*)  All other components within normal limits  LACTIC ACID, PLASMA - Abnormal; Notable for the following:    Lactic Acid, Venous 2.3  (*)    All other components within normal limits  HEMOGLOBIN AND HEMATOCRIT, BLOOD - Abnormal; Notable for the following:    Hemoglobin 5.7 (*)    HCT 17.5 (*)    All other components within normal limits  POC OCCULT BLOOD, ED - Abnormal; Notable for the following:    Fecal Occult Bld POSITIVE (*)    All other components within normal limits  URINE CULTURE  CULTURE, BLOOD (ROUTINE X 2)  CULTURE, BLOOD (ROUTINE X 2)  TROPONIN I  URINALYSIS, ROUTINE W REFLEX MICROSCOPIC  TYPE AND SCREEN  PREPARE RBC (CROSSMATCH)  ABO/RH   Imaging Review Ct Head Wo Contrast  07/29/2013   CLINICAL DATA:  Altered mental status  EXAM: CT HEAD WITHOUT CONTRAST  TECHNIQUE: Contiguous axial images were obtained from the base of the skull through the vertex without intravenous contrast. Study was obtained within 24 hr of patient's arrival at the emergency department.  COMPARISON:  Brain MRI and brain CT July 20, 2013  FINDINGS: Moderate diffuse atrophy is stable. There is no mass, hemorrhage, extra-axial fluid collection, or midline shift. There is small vessel disease throughout the centra semiovale bilaterally, stable. There is a prior small lacunar infarct in the head of the caudate nucleus on the right. There is no new gray-white compartment lesion. There is no demonstrable acute infarct.  The bony calvarium appears intact. The mastoid air cells are clear. There is opacification in the left maxillary antrum. Opacity extends into the left naris. Question mucocele given this appearance. There is calcification within portions of this opacification in the left maxillary antrum.  IMPRESSION: Atrophy with periventricular small vessel disease, stable. Small prior lacunar infarct right caudate nucleus head. No intracranial mass, hemorrhage, or acute infarct.  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 Calcification this area may represent superimposed  chronic fungal infection.   Electronically Signed   By: Lowella Grip M.D.   On: 07/29/2013 07:39   Dg Chest Port 1 View  07/29/2013   CLINICAL DATA:  Short of breath  EXAM: PORTABLE CHEST - 1 VIEW  COMPARISON:  07/20/2013  FINDINGS: The heart size and mediastinal contours are within normal limits. Both lungs are clear. The visualized skeletal structures are unremarkable.  IMPRESSION: No active disease.   Electronically Signed   By: Franchot Gallo M.D.   On: 07/29/2013 07:21     EKG Interpretation None      Date: 07/29/2013  Rate: 62  Rhythm: normal sinus rhythm  QRS Axis: normal  Intervals: normal  ST/T Wave abnormalities: ST depressions laterally  Conduction Disutrbances:none  Narrative Interpretation:   Old EKG Reviewed: non-specific later ST changes are new   MDM   Final diagnoses:  Mental status change  Anemia  GI bleed  Hypotension  Respiratory failure  Aspiration pneumonia    Patient brought to the ER for evaluation of acute mental status changes. Sometime overnight patient became unresponsive. Upon arrival to the ER, she has minimal response even to painful stimuli. She appears short of breath and was hypoxic. This is concerning for possible aspiration. Patient is a DO NOT RESUSCITATE, therefore respiratory status was monitored closely, but intubation not possible. She has had progressively declining oxygenation through the course of the evaluation here in the ER, and initiated on BiPAP for this.  Basic workup does not show any obvious reason for the acute mental status changes. The hair was clear. Chest x-ray did not show obvious pneumonia, although clinically she does appear to have pneumonia. This would fit with aspiration. Patient's CBC shows significant anemia. Rectal exam performed, gross blood present.  Patient repeatedly reevaluated here in the ER. She appears to be tolerating the BiPAP. Transfusion of 3 units of packed red blood cells has been ordered. She  came in normotensive, but has progressively become more hypotensive. She was given gentle hydration initially, awaiting transfusion of packed red blood cells.  Patient will require admission to the step down unit.  CRITICAL CARE Performed by: Orpah Greek   Total critical care time: 30 min  Critical care time was exclusive of separately billable procedures and treating other patients.  Critical care was necessary to treat or prevent imminent or life-threatening deterioration.  Critical care was time spent personally by me on the following activities: development of treatment plan with patient and/or surrogate as well as nursing, discussions with consultants, evaluation of patient's response to treatment, examination of patient, obtaining history from patient or surrogate, ordering and performing treatments and interventions, ordering and review of laboratory studies, ordering and review of radiographic studies, pulse oximetry and re-evaluation of patient's condition.  Addendum: Discussed briefly with GI, they will see the patient.  Orpah Greek, MD 07/29/13 [place], MD 07/29/13 [phone]

## 2013-07-29 NOTE — ED Notes (Signed)
Patient taken to CT.

## 2013-07-29 NOTE — ED Notes (Signed)
Admitting MD at the bedside.  

## 2013-07-29 NOTE — Progress Notes (Signed)
RN paged this NP that family confirmed pt is a DNR and has a goldenrod form. Order changed. Clance Boll, NP [facility]

## 2013-07-29 NOTE — ED Notes (Addendum)
Lattie Haw, [place] notified of patient's critical value.

## 2013-07-29 NOTE — Progress Notes (Signed)
Pt arrived from ED per stretcher and on BiPap. Accompanied by 2 RN's and Resp therapist also daughter. Pt moved to new bed oriented to room.

## 2013-07-29 NOTE — Progress Notes (Signed)
Pt taken off BiPAP and titrated to 4 LPM [place] per MD [facility] respiratory consult order. Pt is in no distress. Vitals are stable. SPO2 100%. RT will continue to monitor.

## 2013-07-30 DIAGNOSIS — D62 Acute posthemorrhagic anemia: Secondary | ICD-10-CM

## 2013-07-30 DIAGNOSIS — I10 Essential (primary) hypertension: Secondary | ICD-10-CM

## 2013-07-30 DIAGNOSIS — G9340 Encephalopathy, unspecified: Secondary | ICD-10-CM

## 2013-07-30 DIAGNOSIS — J9600 Acute respiratory failure, unspecified whether with hypoxia or hypercapnia: Secondary | ICD-10-CM | POA: Diagnosis present

## 2013-07-30 LAB — BASIC METABOLIC PANEL
BUN: 97 mg/dL — AB (ref 6–23)
CALCIUM: 10.2 mg/dL (ref 8.4–10.5)
CHLORIDE: 92 meq/L — AB (ref 96–112)
CO2: 23 meq/L (ref 19–32)
Creatinine, Ser: 1.36 mg/dL — ABNORMAL HIGH (ref 0.50–1.10)
GFR calc Af Amer: 41 mL/min — ABNORMAL LOW (ref >90)
GFR calc non Af Amer: 35 mL/min — ABNORMAL LOW (ref >90)
Glucose, Bld: 192 mg/dL — ABNORMAL HIGH (ref 70–99)
Potassium: 4.3 mEq/L (ref 3.7–5.3)
Sodium: 136 mEq/L — ABNORMAL LOW (ref 137–147)

## 2013-07-30 LAB — GLUCOSE, CAPILLARY
Glucose-Capillary: 165 mg/dL — ABNORMAL HIGH (ref 70–99)
Glucose-Capillary: 210 mg/dL — ABNORMAL HIGH (ref 70–99)
Glucose-Capillary: 228 mg/dL — ABNORMAL HIGH (ref 70–99)
Glucose-Capillary: 227 mg/dL — ABNORMAL HIGH (ref 70–99)
Glucose-Capillary: 263 mg/dL — ABNORMAL HIGH (ref 70–99)

## 2013-07-30 LAB — URINE CULTURE
COLONY COUNT: NO GROWTH
Culture: NO GROWTH

## 2013-07-30 LAB — CBC
HCT: 35.1 % — ABNORMAL LOW (ref 36.0–46.0)
HEMATOCRIT: 35.5 % — AB (ref 36.0–46.0)
HEMATOCRIT: 34.4 % — AB (ref 36.0–46.0)
HEMOGLOBIN: 12.3 g/dL (ref 12.0–15.0)
Hemoglobin: 12.8 g/dL (ref 12.0–15.0)
Hemoglobin: 12.5 g/dL (ref 12.0–15.0)
MCH: 30.2 pg (ref 26.0–34.0)
MCH: 30.3 pg (ref 26.0–34.0)
MCH: 30.1 pg (ref 26.0–34.0)
MCHC: 35.6 g/dL (ref 30.0–36.0)
MCHC: 35.8 g/dL (ref 30.0–36.0)
MCHC: 36.1 g/dL — ABNORMAL HIGH (ref 30.0–36.0)
MCV: 83.7 fL (ref 78.0–100.0)
MCV: 85 fL (ref 78.0–100.0)
MCV: 84.1 fL (ref 78.0–100.0)
Platelets: 231 10*3/uL (ref 150–400)
Platelets: 234 10*3/uL (ref 150–400)
Platelets: 255 10*3/uL (ref 150–400)
RBC: 4.09 MIL/uL (ref 3.87–5.11)
RBC: 4.13 MIL/uL (ref 3.87–5.11)
RBC: 4.24 MIL/uL (ref 3.87–5.11)
RDW: 14.8 % (ref 11.5–15.5)
RDW: 15.2 % (ref 11.5–15.5)
RDW: 15.7 % — AB (ref 11.5–15.5)
WBC: 13.9 10*3/uL — AB (ref 4.0–10.5)
WBC: 13.2 10*3/uL — AB (ref 4.0–10.5)
WBC: 14 10*3/uL — AB (ref 4.0–10.5)

## 2013-07-30 LAB — TYPE AND SCREEN
ABO/RH(D): A NEG
Antibody Screen: NEGATIVE
UNIT DIVISION: 0
UNIT DIVISION: 0
Unit division: 0

## 2013-07-30 LAB — MAGNESIUM: Magnesium: 1.5 mg/dL (ref 1.5–2.5)

## 2013-07-30 LAB — LACTIC ACID, PLASMA: Lactic Acid, Venous: 1.5 mmol/L (ref 0.5–2.2)

## 2013-07-30 LAB — TROPONIN I: Troponin I: <0.3 ng/mL (ref <0.30)

## 2013-07-30 LAB — PHOSPHORUS: Phosphorus: 3.3 mg/dL (ref 2.3–4.6)

## 2013-07-30 LAB — PRO B NATRIURETIC PEPTIDE: Pro B Natriuretic peptide (BNP): 580.2 pg/mL — ABNORMAL HIGH (ref 0–450)

## 2013-07-30 MED ORDER — PANTOPRAZOLE SODIUM 40 MG IV SOLR
40.0000 mg | Freq: Two times a day (BID) | INTRAVENOUS | Status: DC
  Administered 2013-07-30 (×2): 40 mg via INTRAVENOUS
  Filled 2013-07-30 (×4): qty 40

## 2013-07-30 MED ORDER — INSULIN ASPART 100 UNIT/ML ~~LOC~~ SOLN
0.0000 [IU] | SUBCUTANEOUS | Status: DC
  Administered 2013-07-30: 5 [IU] via SUBCUTANEOUS
  Administered 2013-07-30: 3 [IU] via SUBCUTANEOUS
  Administered 2013-07-31: 1 [IU] via SUBCUTANEOUS
  Administered 2013-07-31: 3 [IU] via SUBCUTANEOUS
  Administered 2013-07-31: 7 [IU] via SUBCUTANEOUS
  Administered 2013-07-31: 3 [IU] via SUBCUTANEOUS
  Administered 2013-07-31: 2 [IU] via SUBCUTANEOUS
  Administered 2013-07-31 – 2013-08-01 (×2): 1 [IU] via SUBCUTANEOUS
  Administered 2013-08-02: 5 [IU] via SUBCUTANEOUS
  Administered 2013-08-02: 3 [IU] via SUBCUTANEOUS
  Administered 2013-08-02: 2 [IU] via SUBCUTANEOUS
  Administered 2013-08-02 (×2): 1 [IU] via SUBCUTANEOUS

## 2013-07-30 MED ORDER — HYDRALAZINE HCL 20 MG/ML IJ SOLN
10.0000 mg | Freq: Four times a day (QID) | INTRAMUSCULAR | Status: DC | PRN
  Administered 2013-07-30 – 2013-08-02 (×8): 10 mg via INTRAVENOUS
  Filled 2013-07-30 (×9): qty 1

## 2013-07-30 MED ORDER — VANCOMYCIN HCL 500 MG IV SOLR
500.0000 mg | INTRAVENOUS | Status: DC
  Administered 2013-07-30: 500 mg via INTRAVENOUS
  Filled 2013-07-30 (×2): qty 500

## 2013-07-30 MED ORDER — SODIUM CHLORIDE 0.9 % IV SOLN
INTRAVENOUS | Status: DC
  Administered 2013-07-30 – 2013-07-31 (×2): via INTRAVENOUS

## 2013-07-30 MED ORDER — SODIUM CHLORIDE 0.9 % IV SOLN: 250.0000 mL | INTRAVENOUS | Status: DC | PRN

## 2013-07-30 NOTE — Progress Notes (Signed)
[facility] PROGRESS NOTE  [name] [id number] DOB: 11-Aug-1928 DOA: 07/29/2013 PCP: Henrine Screws, MD  Assessment/Plan: Active Problems:   Acute encephalopathy: Looks to be secondary to hypoxia and/or the setting of significant blood loss anemia. With blood cell transfusion and supple no oxygen, patient Claritin interactive today.    Stroke, history of: Stable. CT scan unremarkable    Hypertension: Blood pressures markedly elevated this morning. Likely from blood cell transfusion. Patient not really volume overloaded as BNP okay. Added when necessary hydralazine.   Diabetes type 2, uncontrolled   GI bleed: Appreciate gastroenterology help. Monitor for now. Following H./H. Possible EGD later. Continue n.p.o.   Anemia associated with acute blood loss   Acute respiratory failure: Unclear etiology. Possibly aspiration? Chest x-ray unremarkable. Would favor repeat chest x-ray tomorrow 2 view to followup. If pneumonia noted, starting about. Patient and he was afebrile. Repeat CBC in the morning.  Code Status: DO NOT RESUSCITATE Family Communication: Left message on daughter cell phone Disposition Plan: Back to facility possibly tomorrow   Consultants:  [facility] gastroenterology  Procedures:  None  Antibiotics:  Received one dose of Levaquin in the emergency room  HPI/Subjective: Patient states she saw a little better than yesterday. Overall she feels quite uncomfortable  Objective: Filed Vitals:   07/30/13 1137  BP: 135/104  Pulse: 86  Temp: 97.8 F (36.6 C)  Resp: 25    Intake/Output Summary (Last 24 hours) at 07/30/13 1556 Last data filed at 07/30/13 1435  Gross per 24 hour  Intake 288.42 ml  Output      1 ml  Net 287.42 ml   Filed Weights   07/29/13 1130 07/30/13 0407  Weight: 57.5 kg (126 lb 12.2 oz) 54.1 kg (119 lb 4.3 oz)    Exam:   General:  Alert and oriented x3, mild distress secondary to just feeling uncomfortable  Cardiovascular:  Regular rhythm, rate controlled  Respiratory: Clear to auscultation bilaterally  Abdomen: Soft, nontender, nondistended, positive bowel sounds  Musculoskeletal: No clubbing or cyanosis, trace edema  Data Reviewed: Basic Metabolic Panel:  Recent Labs Lab 07/29/13 0804 07/29/13 2339 07/30/13 0347  NA 134*  --  136*  K 4.0  --  4.3  CL 92*  --  92*  CO2 24  --  23  GLUCOSE 135*  --  192*  BUN 118*  --  97*  CREATININE 1.91*  --  1.36*  CALCIUM 10.3  --  10.2  MG  --  1.5  --   PHOS  --  3.3  --    Liver Function Tests: No results found for this basename: AST, ALT, ALKPHOS, BILITOT, PROT, ALBUMIN,  in the last 168 hours No results found for this basename: LIPASE, AMYLASE,  in the last 168 hours No results found for this basename: AMMONIA,  in the last 168 hours CBC:  Recent Labs Lab 07/29/13 0804 07/29/13 0930 07/29/13 2339 07/30/13 0347  WBC 11.6*  --  14.0* 13.2*  NEUTROABS 8.8*  --   --   --   HGB 5.7* 5.7* 12.3 12.8  HCT 17.6* 17.5* 34.4* 35.5*  MCV 88.0  --  84.1 83.7  PLT 303  --  234 231   Cardiac Enzymes:  Recent Labs Lab 07/29/13 0804 07/30/13 0950  TROPONINI <0.30 <0.30   BNP (last 3 results)  Recent Labs  07/29/13 0804 07/30/13 0950  PROBNP 512.7* 580.2*   CBG:  Recent Labs Lab 07/23/13 2220 07/24/13 0641 07/29/13 2315 07/30/13 0758 07/30/13  [place]    Recent Results (from the past 240 hour(s))  CULTURE, BLOOD (ROUTINE X 2)     Status: None   Collection Time    07/29/13  8:00 AM      Result Value Ref Range Status   Specimen Description BLOOD LEFT ANTECUBITAL   Final   Special Requests BOTTLES DRAWN AEROBIC ONLY 10ML   Final   Culture  Setup Time     Final   Value: 07/29/2013 13:50     Performed at [facility]   Culture     Final   Value:        BLOOD CULTURE RECEIVED NO GROWTH TO DATE CULTURE WILL BE HELD FOR 5 DAYS BEFORE ISSUING A FINAL NEGATIVE REPORT     Performed at [facility]   Report Status PENDING   Incomplete  CULTURE, BLOOD (ROUTINE X 2)     Status: None   Collection Time    07/29/13  8:00 AM      Result Value Ref Range Status   Specimen Description BLOOD RIGHT ANTECUBITAL   Final   Special Requests BOTTLES DRAWN AEROBIC ONLY 10ML   Final   Culture  Setup Time     Final   Value: 07/29/2013 13:50     Performed at [facility]   Culture     Final   Value:        BLOOD CULTURE RECEIVED NO GROWTH TO DATE CULTURE WILL BE HELD FOR 5 DAYS BEFORE ISSUING A FINAL NEGATIVE REPORT     Performed at [facility]   Report Status PENDING   Incomplete  URINE CULTURE     Status: None   Collection Time    07/29/13  9:26 AM      Result Value Ref Range Status   Specimen Description URINE, CATHETERIZED   Final   Special Requests NONE   Final   Culture  Setup Time     Final   Value: 07/29/2013 13:56     Performed at [facility]     Final   Value: NO GROWTH     Performed at [facility]   Culture     Final   Value: NO GROWTH     Performed at [facility]   Report Status 07/30/2013 FINAL   Final  MRSA PCR SCREENING     Status: None   Collection Time    07/29/13  6:05 PM      Result Value Ref Range Status   MRSA by PCR NEGATIVE  NEGATIVE Final   Comment:            The GeneXpert MRSA Assay (FDA     approved for NASAL specimens     only), is one component of a     comprehensive MRSA colonization     surveillance program. It is not     intended to diagnose MRSA     infection nor to guide or     monitor treatment for     MRSA infections.     Studies: Ct Head Wo Contrast  07/29/2013   CLINICAL DATA:  Altered mental status  EXAM: CT HEAD WITHOUT CONTRAST  TECHNIQUE: Contiguous axial images were obtained from the base of the skull through the vertex without intravenous contrast. Study was obtained within 24 hr of patient's arrival at the emergency department.  COMPARISON:  Brain MRI  and brain CT July 20, 2013  FINDINGS: Moderate diffuse atrophy is stable. There is no mass, hemorrhage, extra-axial fluid collection, or midline shift. There is small vessel disease throughout the centra semiovale bilaterally, stable. There is a prior small lacunar infarct in the head of the caudate nucleus on the right. There is no new gray-white compartment lesion. There is no demonstrable acute infarct.  The bony calvarium appears intact. The mastoid air cells are clear. There is opacification in the left maxillary antrum. Opacity extends into the left naris. Question mucocele given this appearance. There is calcification within portions of this opacification in the left maxillary antrum.  IMPRESSION: Atrophy with periventricular small vessel disease, stable. Small prior lacunar infarct right caudate nucleus head. No intracranial mass, hemorrhage, or acute infarct.  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 Calcification this area may represent superimposed chronic fungal infection.   Electronically Signed   By: Lowella Grip M.D.   On: 07/29/2013 07:39   Dg Chest Port 1 View  07/29/2013   CLINICAL DATA:  Short of breath  EXAM: PORTABLE CHEST - 1 VIEW  COMPARISON:  07/20/2013  FINDINGS: The heart size and mediastinal contours are within normal limits. Both lungs are clear. The visualized skeletal structures are unremarkable.  IMPRESSION: No active disease.   Electronically Signed   By: Franchot Gallo M.D.   On: 07/29/2013 07:21    Scheduled Meds: . insulin aspart  0-9 Units Subcutaneous TID WC  . insulin glargine  40 Units Subcutaneous [id number]  . pantoprazole (PROTONIX) IV  40 mg Intravenous Q12H  . polyvinyl alcohol  1 drop Both Eyes BID  . simvastatin  10 mg Oral QHS  . sodium chloride  3 mL Intravenous Q12H  . vancomycin  500 mg Intravenous Q24H   Continuous Infusions: . sodium chloride      Active Problems:   Acute encephalopathy    Stroke   Hypertension   Diabetes type 2, uncontrolled   GI bleed   Anemia associated with acute blood loss   Acute respiratory failure    Time spent: 25 minutes    [facility] Pager [phone]. If 7PM-7AM, please contact night-coverage at [email or web address], password [email or web address] 07/30/2013, 3:56 PM  LOS: 1 day

## 2013-07-30 NOTE — Progress Notes (Signed)
Clinical Social Work Department BRIEF PSYCHOSOCIAL ASSESSMENT 07/30/2013  Patient:  [name], [name]     Account Number:  [id number]     Admit date:  07/29/2013  Clinical Social Worker:  Freeman Caldron  Date/Time:  07/30/2013 03:48 PM  Referred by:  Physician  Date Referred:  07/30/2013 Referred for  SNF Placement   Other Referral:   Interview type:  Patient Other interview type:   Also spoke with pt's daughter [name] and son [name].    PSYCHOSOCIAL DATA Living Status:  FACILITY Admitted from facility:  [place] Level of care:  Assisted Living Primary support name:  [name] [phone]) Primary support relationship to patient:  CHILD, ADULT Degree of support available:   Good--pt lives at [facility] and has support from son and daughter, who were both at bedside when I went to complete assessment.    CURRENT CONCERNS Current Concerns  Post-Acute Placement   Other Concerns:    SOCIAL WORK ASSESSMENT / PLAN I spoke with pt, who states she is feeling "a little better" today, and explained to me that she is in the hospital because she has internal bleeding. Daughter [name] explained pt was found unresponsive at [facility] and they brought her to the ER. This is pt's second admission in a short period of time. Pt adamant that she wants to return to facility; I explained that I will discuss this with [facility], and I sent them updated progress notes to give them an idea of pt's disposition. Facility called me and explained they will not accept her back at ALF level of care and she will need to go to SNF if she plans to return to [facility]. Facility is going to call pt's son [name] and explain this to him tomorrow, and I will also have conversation with pt/family about appropriate referral for SNF at [facility] rather than ALF. PT/OT to see pt and give their recommendation. I have updated pt's FL2 and put this on her chart.   Assessment/plan  status:  Psychosocial Support/Ongoing Assessment of Needs Other assessment/ plan:   Information/referral to community resources:   SNF ([facility] [place]).    PATIENT'S/FAMILY'S RESPONSE TO PLAN OF CARE: Good--pt and daughter engaged in conversation with CSW, and son [name] entered the room when I was wrapping up. I provided active listening and support to pt, who expressed she is physically exhausted from being in the hospital and wants to go back to [facility]. I did not realize she was in their ALF, and facility now states she will not be able to return to this level of care and they will only take her back to SNF due to the recent re-admission in hospital. I will provide support to pt/family and have this discussion with them tomorrow concerning facility's decision.       Ky Barban, MSW, [facility] Clinical Social Worker [phone]

## 2013-07-30 NOTE — Progress Notes (Signed)
ANTIBIOTIC CONSULT NOTE - FOLLOW UP  Pharmacy Consult for vancomycin Indication: pneumonia  Allergies  Allergen Reactions  . Penicillins Other (See Comments)    unknown    Patient Measurements: Height: 4\' 11"  (149.9 cm) Weight: 119 lb 4.3 oz (54.1 kg) (wrong patient) IBW/kg (Calculated) : 43.2  Vital Signs: Temp: 97.8 F (36.6 C) (03/19 1137) Temp src: Oral (03/19 1137) BP: 135/104 mmHg (03/19 1137) Pulse Rate: 86 (03/19 1137) Intake/Output from previous day: 03/18 0701 - 03/19 0700 In: 990.9 [I.V.:353; Blood:637.9] Out: -  Intake/Output from this shift: Total I/O In: -  Out: 1 [Stool:1]  Labs:  Recent Labs  07/29/13 0804 07/29/13 0930 07/29/13 2339 07/30/13 0347  WBC 11.6*  --  14.0* 13.2*  HGB 5.7* 5.7* 12.3 12.8  PLT 303  --  234 231  CREATININE 1.91*  --   --  1.36*   Estimated Creatinine Clearance: 24 ml/min (by C-G formula based on Cr of 1.36). No results found for this basename: VANCOTROUGH, VANCOPEAK, VANCORANDOM, [place], GENTPEAK, GENTRANDOM, TOBRATROUGH, TOBRAPEAK, TOBRARND, AMIKACINPEAK, AMIKACINTROU, AMIKACIN,  in the last 72 hours   Microbiology: Recent Results (from the past 720 hour(s))  CULTURE, BLOOD (ROUTINE X 2)     Status: None   Collection Time    07/29/13  8:00 AM      Result Value Ref Range Status   Specimen Description BLOOD LEFT ANTECUBITAL   Final   Special Requests BOTTLES DRAWN AEROBIC ONLY 10ML   Final   Culture  Setup Time     Final   Value: 07/29/2013 13:50     Performed at [facility]   Culture     Final   Value:        BLOOD CULTURE RECEIVED NO GROWTH TO DATE CULTURE WILL BE HELD FOR 5 DAYS BEFORE ISSUING A FINAL NEGATIVE REPORT     Performed at [facility]   Report Status PENDING   Incomplete  CULTURE, BLOOD (ROUTINE X 2)     Status: None   Collection Time    07/29/13  8:00 AM      Result Value Ref Range Status   Specimen Description BLOOD RIGHT ANTECUBITAL   Final   Special Requests BOTTLES  DRAWN AEROBIC ONLY 10ML   Final   Culture  Setup Time     Final   Value: 07/29/2013 13:50     Performed at [facility]   Culture     Final   Value:        BLOOD CULTURE RECEIVED NO GROWTH TO DATE CULTURE WILL BE HELD FOR 5 DAYS BEFORE ISSUING A FINAL NEGATIVE REPORT     Performed at [facility]   Report Status PENDING   Incomplete  URINE CULTURE     Status: None   Collection Time    07/29/13  9:26 AM      Result Value Ref Range Status   Specimen Description URINE, CATHETERIZED   Final   Special Requests NONE   Final   Culture  Setup Time     Final   Value: 07/29/2013 13:56     Performed at [place]     Final   Value: NO GROWTH     Performed at [facility]   Culture     Final   Value: NO GROWTH     Performed at [facility]   Report Status 07/30/2013 FINAL   Final  MRSA PCR SCREENING  Status: None   Collection Time    07/29/13  6:05 PM      Result Value Ref Range Status   MRSA by PCR NEGATIVE  NEGATIVE Final   Comment:            The GeneXpert MRSA Assay (FDA     approved for NASAL specimens     only), is one component of a     comprehensive MRSA colonization     surveillance program. It is not     intended to diagnose MRSA     infection nor to guide or     monitor treatment for     MRSA infections.    Anti-infectives   Start     Dose/Rate Route Frequency Ordered Stop   07/31/13 1100  vancomycin (VANCOCIN) IVPB 1000 mg/200 mL premix  Status:  Discontinued     1,000 mg 200 mL/hr over 60 Minutes Intravenous Every 48 hours 07/29/13 1143 07/30/13 1442   07/30/13 1600  vancomycin (VANCOCIN) 500 mg in sodium chloride 0.9 % 100 mL IVPB     500 mg 100 mL/hr over 60 Minutes Intravenous Every 24 hours 07/30/13 1442     07/29/13 1145  vancomycin (VANCOCIN) IVPB 1000 mg/200 mL premix     1,000 mg 200 mL/hr over 60 Minutes Intravenous  Once 07/29/13 1131 07/29/13 1622   07/29/13 1130  levofloxacin (LEVAQUIN) IVPB  750 mg     750 mg 100 mL/hr over 90 Minutes Intravenous  Once 07/29/13 1119 07/29/13 1627      Assessment: 80 y/o female from SNF found unresponsive 3/18 and brought to ED. Pharmacy consulted to begin vancomycin for PNA. Renal function improved so will adjust dose.  Goal of Therapy:  Vancomycin trough level 15-20 mcg/ml  Plan:  -Change vancomycin to 500 mg IV q24h -MD, please re-order Levaquin for gram negative coverage if antibiotics needed - one time dose ordered yesterday  Renold Genta, Pharm.D., BCPS Clinical Pharmacist Pager: [phone] 07/30/2013 2:45 PM

## 2013-07-30 NOTE — Progress Notes (Signed)
Transfer cancelled per order of Dr. [place] Pink due to elevated b/p of 191/77. Medicated with prn dose of hydralizine. Last b/p 159/88.

## 2013-07-30 NOTE — Clinical Documentation Improvement (Signed)
   The diagnosis of "Respiratory Failure" is documented in the ED physician note.  The patient's 02 sats dropped into the 80's and required the initiation of BiPap and admission to an intensive care unit.  If you agree with the diagnosis provided by the ED physician, please document the diagnosis in the progress notes and discharge summary, including the ACUITY and follow up assessment of the diagnosis.   Thank you,   Erling Conte ,RN BSN CCDS Certified Clinical Documentation Specialist:  [place] [employer] [facility]

## 2013-07-30 NOTE — Consult Note (Signed)
Referring Provider: No ref. provider found Primary Care Physician:  Henrine Screws, MD Primary Gastroenterologist:  unknown  Sherman for Consultation:  Heme positive stool and profound anemia  HPI: [name] is a 80 y.o. female admitted to the hospital yesterday afternoon through the emergency room, in transfer from [place] home where she was unresponsive. She has a prior history of CVA and is DO NOT RESUSCITATE.  In the emergency room, she was found to be hypoxemic and profoundly anemic, with hemoglobin of 5.7, as compared to a hemoglobin of 10.7 a week earlier.   Also, the patient's BUN had risen substantially, from a previous baseline of 49 on March 11, to a level of 118 on presentation yesterday.   Overnight, the patient has received 3 units of packed cells, with a higher than expected rise in hemoglobin, to 12.3 (on repeat, 12.8).   With correction of her hypoxemia (she was on BiPAP initially, and then converted to a nasal cannula O2) and correction of her anemia overnight, her mental status has markedly improved.  The patient was Hemoccult positive in the emergency room. There is no history of melenic stools or hematemesis, but the patient is on meloxicam, without PPI prophylaxis, as an outpatient. There is no prior history of ulcer disease or GI bleeding.   Past Medical History  Diagnosis Date  . Diabetes mellitus   . Hypertension   . Gout   . Pneumonia   . Stroke     L sided deficits w/slurred speech  . Cancer 07/04/10    endometrial  . Falling episodes   . Hx of radiation therapy 09/13/10- 10/19/10, 6/12, 6/19, 11/14/10    pelvis, external beam tehn brachytherapy  . Renal disorder     Past Surgical History  Procedure Laterality Date  . Abdominal hysterectomy      Prior to Admission medications   Medication Sig Start Date End Date Taking? Authorizing Provider  allopurinol (ZYLOPRIM) 100 MG tablet Take 150 mg by mouth at bedtime.   Yes Historical Provider,  MD  amLODipine (NORVASC) 5 MG tablet Take 5 mg by mouth daily.    Yes Historical Provider, MD  aspirin 325 MG tablet Take 0.5 tablets (162 mg total) by mouth daily. 07/24/13  Yes Josetta Huddle, MD  bisoprolol (ZEBETA) 10 MG tablet Take 1 tablet (10 mg total) by mouth daily. 07/23/13  Yes Josetta Huddle, MD  carboxymethylcellulose (REFRESH PLUS) 0.5 % SOLN Place 1 drop into both eyes 2 (two) times daily.   Yes Historical Provider, MD  Cholecalciferol (HM VITAMIN D3) 4000 UNITS CAPS Take 8,000 Units by mouth daily.   Yes Historical Provider, MD  Cyanocobalamin (VITAMIN B 12 PO) Take 1 tablet by mouth daily.    Yes Historical Provider, MD  insulin aspart (NOVOLOG) 100 UNIT/ML injection Inject 0-12 Units into the skin 2 (two) times daily. Sliding scale <150=0 units 151-200=4 units 201-250=6 units 251-300=8 units 301-350=10 units 351-400=12 units   Yes Historical Provider, MD  Insulin Glargine (LANTUS SOLOSTAR) 100 UNIT/ML Solostar Pen Inject 40 Units into the skin every morning. 07/23/13  Yes Josetta Huddle, MD  LORazepam (ATIVAN) 0.5 MG tablet Take 0.5 mg by mouth every 6 (six) hours as needed for anxiety.   Yes Historical Provider, MD  losartan (COZAAR) 100 MG tablet Take 1 tablet (100 mg total) by mouth daily. 07/23/13  Yes Josetta Huddle, MD  meloxicam (MOBIC) 7.5 MG tablet Take 7.5 mg by mouth daily.   Yes Historical Provider, MD  promethazine (PHENERGAN)  25 MG suppository Place 25 mg rectally every 6 (six) hours as needed for nausea.   Yes Historical Provider, MD  promethazine (PHENERGAN) 25 MG tablet Take 25 mg by mouth every 6 (six) hours as needed for nausea.   Yes Historical Provider, MD  simvastatin (ZOCOR) 10 MG tablet Take 1 tablet (10 mg total) by mouth at bedtime. 07/24/13  Yes Josetta Huddle, MD  traZODone (DESYREL) 50 MG tablet Take 50 mg by mouth at bedtime.   Yes Historical Provider, MD    Current Facility-Administered Medications  Medication Dose Route Frequency Provider Last Rate Last Dose  .  0.9 %  sodium chloride infusion  250 mL Intravenous PRN Annita Brod, MD      . albuterol (PROVENTIL) (2.5 MG/3ML) 0.083% nebulizer solution 2.5 mg  2.5 mg Nebulization Q2H PRN Velvet Bathe, MD      . calcium carbonate (TUMS - dosed in mg elemental calcium) chewable tablet 200-600 mg of elemental calcium  1-3 tablet Oral TID PRN Gardiner Barefoot, NP   200 mg of elemental calcium at 07/29/13 2205  . hydrALAZINE (APRESOLINE) injection 10 mg  10 mg Intravenous Q6H PRN Annita Brod, MD      . insulin aspart (novoLOG) injection 0-9 Units  0-9 Units Subcutaneous TID WC Velvet Bathe, MD   3 Units at 07/30/13 0844  . insulin glargine (LANTUS) injection 40 Units  40 Units Subcutaneous Lorenso Courier, MD   40 Units at 07/30/13 0800  . polyvinyl alcohol (LIQUIFILM TEARS) 1.4 % ophthalmic solution 1 drop  1 drop Both Eyes BID Jeronimo Norma, RPH   1 drop at 07/29/13 2209  . simvastatin (ZOCOR) tablet 10 mg  10 mg Oral QHS Velvet Bathe, MD   10 mg at 07/29/13 2204  . sodium chloride 0.9 % injection 3 mL  3 mL Intravenous Q12H Velvet Bathe, MD   3 mL at 07/29/13 2207  . sodium chloride 0.9 % injection 3 mL  3 mL Intravenous PRN Velvet Bathe, MD      . Derrill Memo ON 07/31/2013] vancomycin (VANCOCIN) IVPB 1000 mg/200 mL premix  1,000 mg Intravenous Q48H Lauren Bajbus, RPH        Allergies as of 07/29/2013 - Review Complete 07/29/2013  Allergen Reaction Noted  . Penicillins Other (See Comments) 06/11/2011    Family History  Problem Relation Age of Onset  . Hypertension Mother   . Hypertension Father     History   Social History  . Marital Status: Legally Separated    Spouse Name: N/A    Number of Children: N/A  . Years of Education: N/A   Occupational History  . Not on file.   Social History Main Topics  . Smoking status: Former Smoker    Types: Cigarettes  . Smokeless tobacco: Not on file     Comment: quit many yrs ago  . Alcohol Use: No  . Drug Use: Not on file  . Sexual  Activity: Not on file   Other Topics Concern  . Not on file   Social History Narrative  . No narrative on file    Review of Systems: The patient indicates that she gets a fair amount of heartburn and uses TUMS quite frequently.  Physical Exam: Vital signs in last 24 hours: Temp:  [97.4 F (36.3 C)-98.8 F (37.1 C)] 97.7 F (36.5 C) (03/19 0740) Pulse Rate:  [56-227] 80 (03/19 0740) Resp:  [14-22] 21 (03/19 0740) BP: (94-191)/(31-95) 191/88 mmHg (03/19 0740) SpO2:  [  80 %-100 %] 94 % (03/19 0740) FiO2 (%):  [40 %] 40 % (03/18 1738) Weight:  [54.1 kg (119 lb 4.3 oz)-57.5 kg (126 lb 12.2 oz)] 54.1 kg (119 lb 4.3 oz) (03/19 0407) Last BM Date:  (PTA)  A pleasant patient in no distress. Vitals normal except for hypertension. She is anicteric. His skin is warm and dry. There is no evident pallor. Chest clear anteriorly, heart without murmur or arrhythmia. Abdomen has active bowel sounds, no organomegaly, guarding, mass effect, or tenderness. Rectal exam shows a generous amount of semi-formed brown stool, no visible blood, no evidence of melena. The patient is status post CVA and has a tremor in her right arm.   Intake/Output from previous day: 03/18 0701 - 03/19 0700 In: 990.9 [I.V.:353; Blood:637.9] Out: -  Intake/Output this shift:    Lab Results:  Recent Labs  07/29/13 0804 07/29/13 0930 07/29/13 2339 07/30/13 0347  WBC 11.6*  --  14.0* 13.2*  HGB 5.7* 5.7* 12.3 12.8  HCT 17.6* 17.5* 34.4* 35.5*  PLT 303  --  234 231   BMET  Recent Labs  07/29/13 0804 07/30/13 0347  NA 134* 136*  K 4.0 4.3  CL 92* 92*  CO2 24 23  GLUCOSE 135* 192*  BUN 118* 97*  CREATININE 1.91* 1.36*  CALCIUM 10.3 10.2   LFT No results found for this basename: PROT, ALBUMIN, AST, ALT, ALKPHOS, BILITOT, BILIDIR, IBILI,  in the last 72 hours PT/INR  Recent Labs  07/29/13 1205  LABPROT 13.9  INR 1.09      Studies/Results: Ct Head Wo Contrast  07/29/2013   CLINICAL DATA:  Altered  mental status  EXAM: CT HEAD WITHOUT CONTRAST  TECHNIQUE: Contiguous axial images were obtained from the base of the skull through the vertex without intravenous contrast. Study was obtained within 24 hr of patient's arrival at the emergency department.  COMPARISON:  Brain MRI and brain CT July 20, 2013  FINDINGS: Moderate diffuse atrophy is stable. There is no mass, hemorrhage, extra-axial fluid collection, or midline shift. There is small vessel disease throughout the centra semiovale bilaterally, stable. There is a prior small lacunar infarct in the head of the caudate nucleus on the right. There is no new gray-white compartment lesion. There is no demonstrable acute infarct.  The bony calvarium appears intact. The mastoid air cells are clear. There is opacification in the left maxillary antrum. Opacity extends into the left naris. Question mucocele given this appearance. There is calcification within portions of this opacification in the left maxillary antrum.  IMPRESSION: Atrophy with periventricular small vessel disease, stable. Small prior lacunar infarct right caudate nucleus head. No intracranial mass, hemorrhage, or acute infarct.  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 Calcification this area may represent superimposed chronic fungal infection.   Electronically Signed   By: Lowella Grip M.D.   On: 07/29/2013 07:39   Dg Chest Port 1 View  07/29/2013   CLINICAL DATA:  Short of breath  EXAM: PORTABLE CHEST - 1 VIEW  COMPARISON:  07/20/2013  FINDINGS: The heart size and mediastinal contours are within normal limits. Both lungs are clear. The visualized skeletal structures are unremarkable.  IMPRESSION: No active disease.   Electronically Signed   By: Franchot Gallo M.D.   On: 07/29/2013 07:21    Impression:   1. Profound anemia, confirmed on repeat testing, with 5 g drop in hemoglobin over the past week but  without  documented GI bleeding. Supra-physiologic rise in hemoglobin after transfusion of 3 units of packed cells overnight. 2. Marked rise in BUN over the past week, suggestive of volume contraction 3. Hemoccult-positive stool, without frank melena or visible blood 4. Exposure to nonsteroidal anti-inflammatory drug, without PPI prophylaxis 5. Obtundation at time of presentation, resolved  Discussion: Her stool at this time is non-melenic in character. My best assessment is that this patient had a GI bleed shortly following her recent hospitalization, which went unrecognized at her nursing facility. We're seeing the aftermath of that, with residual heme positivity, posthemorrhagic anemia, renal dysfunction from volume contraction, and transiently altered mental status  Plan: 1. Aggressive PPI therapy 2. Consider endoscopic evaluation. I have left a voicemail on the patient's daughters cell phone [name], [phone]) asking her to call me to discuss the pros and cons of doing this. We are without a diagnosis, and endoscopic evaluation would help [place] know whether the patient has an NSAID-induced ulcer, which would have implications for future use of NSAIDs and PPI therapy. On the other hand, the patient is clinically stable at this time and is in somewhat frail medical condition, DO NOT RESUSCITATE, etc. So a case could be made for temporary NSAID avoidance and empiric PPI therapy. 3. For now, I would keep the patient n.p.o., in case a decision is made to proceed with endoscopic evaluation later in the day.   LOS: 1 day   Jesselee Poth V  07/30/2013, 9:45 AM

## 2013-07-31 ENCOUNTER — Inpatient Hospital Stay (HOSPITAL_COMMUNITY): Payer: Medicare Other

## 2013-07-31 LAB — BASIC METABOLIC PANEL
BUN: 71 mg/dL — AB (ref 6–23)
CALCIUM: 10.3 mg/dL (ref 8.4–10.5)
CO2: 23 meq/L (ref 19–32)
Chloride: 105 mEq/L (ref 96–112)
Creatinine, Ser: 1.07 mg/dL (ref 0.50–1.10)
GFR calc Af Amer: 54 mL/min — ABNORMAL LOW (ref >90)
GFR calc non Af Amer: 47 mL/min — ABNORMAL LOW (ref >90)
GLUCOSE: 232 mg/dL — AB (ref 70–99)
Potassium: 3.7 mEq/L (ref 3.7–5.3)
Sodium: 145 mEq/L (ref 137–147)

## 2013-07-31 LAB — CBC
HCT: 30.7 % — ABNORMAL LOW (ref 36.0–46.0)
HEMOGLOBIN: 10.7 g/dL — AB (ref 12.0–15.0)
MCH: 29.9 pg (ref 26.0–34.0)
MCHC: 34.9 g/dL (ref 30.0–36.0)
MCV: 85.8 fL (ref 78.0–100.0)
PLATELETS: 252 10*3/uL (ref 150–400)
RBC: 3.58 MIL/uL — AB (ref 3.87–5.11)
RDW: 16 % — ABNORMAL HIGH (ref 11.5–15.5)
WBC: 10.3 10*3/uL (ref 4.0–10.5)

## 2013-07-31 LAB — GLUCOSE, CAPILLARY
GLUCOSE-CAPILLARY: 207 mg/dL — AB (ref 70–99)
GLUCOSE-CAPILLARY: 216 mg/dL — AB (ref 70–99)
Glucose-Capillary: 121 mg/dL — ABNORMAL HIGH (ref 70–99)
Glucose-Capillary: 148 mg/dL — ABNORMAL HIGH (ref 70–99)
Glucose-Capillary: 200 mg/dL — ABNORMAL HIGH (ref 70–99)
Glucose-Capillary: 310 mg/dL — ABNORMAL HIGH (ref 70–99)

## 2013-07-31 MED ORDER — ALLOPURINOL 100 MG PO TABS
100.0000 mg | ORAL_TABLET | Freq: Every day | ORAL | Status: DC
  Administered 2013-07-31 – 2013-08-03 (×4): 100 mg via ORAL
  Filled 2013-07-31 (×4): qty 1

## 2013-07-31 MED ORDER — PANTOPRAZOLE SODIUM 40 MG PO TBEC
40.0000 mg | DELAYED_RELEASE_TABLET | Freq: Two times a day (BID) | ORAL | Status: DC
  Administered 2013-07-31 – 2013-08-03 (×7): 40 mg via ORAL
  Filled 2013-07-31 (×7): qty 1

## 2013-07-31 MED ORDER — BISOPROLOL FUMARATE 10 MG PO TABS
10.0000 mg | ORAL_TABLET | Freq: Every day | ORAL | Status: DC
  Administered 2013-07-31 – 2013-08-03 (×4): 10 mg via ORAL
  Filled 2013-07-31 (×4): qty 1

## 2013-07-31 MED ORDER — AMLODIPINE BESYLATE 5 MG PO TABS
5.0000 mg | ORAL_TABLET | Freq: Every day | ORAL | Status: DC
  Administered 2013-07-31 – 2013-08-03 (×4): 5 mg via ORAL
  Filled 2013-07-31 (×4): qty 1

## 2013-07-31 NOTE — Progress Notes (Signed)
NURSING PROGRESS NOTE  [name] [id number] Transfer Data: 07/31/2013 2:07 PM Attending Provider: Josetta Huddle, MD [id number] NEVILL, MD Code Status: DNR  [name] is a 80 y.o. female patient transferred from [facility] -No acute distress noted.  -No complaints of shortness of breath.  -No complaints of chest pain.    Last Documented Vital Signs: Blood pressure 175/72, pulse 78, temperature 97.4 F (36.3 C), temperature source Oral, resp. rate 20, height 5' (1.524 m), weight 54.1 kg (119 lb 4.3 oz), SpO2 99.00%.  IV Fluids:  IV in place, occlusive dsg intact without redness, IV cath antecubital right, condition patent and no redness and left, condition patent and no redness normal saline @ 75.   Allergies:  Penicillins  Past Medical History:   has a past medical history of Diabetes mellitus; Hypertension; Gout; Pneumonia; Stroke; Cancer (07/04/10); Falling episodes; radiation therapy (09/13/10- 10/19/10, 6/12, 6/19, 11/14/10); and Renal disorder.  Past Surgical History:   has past surgical history that includes Abdominal hysterectomy.  Social History:   reports that she has quit smoking. Her smoking use included Cigarettes. She smoked 0.00 packs per day. She does not have any smokeless tobacco history on file. She reports that she does not drink alcohol.  Skin: Warm, dry, intact   Patient/Family orientated to room. Side rails up x 2, fall assessment and education completed with patient/family. Patient/family able to verbalize understanding of risk associated with falls and verbalized understanding to call for assistance before getting out of bed. Call light within reach. Patient/family able to voice and demonstrate understanding of unit orientation instructions.    Will continue to evaluate and treat per MD orders.

## 2013-07-31 NOTE — Evaluation (Signed)
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    Clinical Impression   Pt admitted with above.  She demonstrates the below listed deficits.   She required extensive amount of assistance with BADLs PTA, but it appears that she needs more assistance with functional transfers.  Feel all deficits can be addressed at SNF with no immediate acute needs identified.  Will sign off at this time and defer further OT to SNF.     OT Assessment  All further OT needs can be met in the next venue of care    Follow Up Recommendations  SNF    Barriers to Discharge      Equipment Recommendations  None recommended by OT    Recommendations for Other Services    Frequency       Precautions / Restrictions Precautions Precautions: Fall   Pertinent Vitals/Pain     ADL  Eating/Feeding: Set up Where Assessed - Eating/Feeding: Chair Grooming: Wash/dry hands;Wash/dry face;Teeth care;Brushing hair;Set up Where Assessed - Grooming: Supported sitting Upper Body Bathing: Maximal assistance Where Assessed - Upper Body Bathing: Supported sitting Lower Body Bathing: Maximal assistance Where Assessed - Lower Body Bathing: Supported sit to stand Upper Body Dressing: Maximal assistance Where Assessed - Upper Body Dressing: Unsupported sitting Lower Body Dressing: +1 Total assistance Where Assessed - Lower Body Dressing: Supported sit to Lobbyist: Maximal assistance Toilet Transfer Method: Sit to  stand Toileting - Clothing Manipulation and Hygiene: +1 Total assistance Where Assessed - Toileting Clothing Manipulation and Hygiene: Sit to stand from 3-in-1 or toilet Transfers/Ambulation Related to ADLs: max A sit to stand    OT Diagnosis: Generalized weakness;Cognitive deficits;Hemiplegia non-dominant side  OT Problem List: Decreased strength;Decreased range of motion;Decreased activity tolerance;Impaired balance (sitting and/or standing);Decreased coordination;Decreased cognition;Decreased safety awareness;Decreased knowledge of use of DME or AE;Impaired UE functional use OT Treatment Interventions:     OT Goals(Current goals can be found in the care plan section)    Visit Information  Last OT Received On: 07/31/13 Assistance Needed: +2 History of Present Illness: Resident of [facility], past medical history of type 2 diabetes, hypertension, gout, 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        Prior [place] expects to be discharged to:: Skilled nursing facility Type of Home: Assisted living Additional Comments: pt from [facility] and plans to return [facility] Prior Function Level of Independence: Needs assistance Gait / Transfers Assistance Needed: pt reports 2 people would stand her and get her to her wheelchair  ADL's / Homemaking Assistance Needed: Pt reports she was able to feed self and perform grooming from w/c level.  She required max to total A for all other aspects of BADLs Communication Communication: No difficulties Dominant Hand: Right         Vision/Perception Vision - Assessment Vision Assessment: Vision not tested   Cognition  Cognition Arousal/Alertness: Awake/alert Behavior During Therapy: [facility] for  tasks assessed/performed Overall Cognitive  Status: No family/caregiver present to determine baseline cognitive functioning General Comments: Pt with slow processing, but able to answer questions re: PLOF appropriately    Extremity/Trunk Assessment Upper Extremity Assessment Upper Extremity Assessment: LUE deficits/detail RUE Deficits / Details: Pt with tremor LUE Deficits / Details: Pt with h/o CVA.  She maintains Lt. UE in flexed position, but is able to extend digits and thumb and reach to ~50*.  She demonstrates full PROM Lt hand and shoulder PROM ~70* LUE Coordination: decreased gross motor;decreased fine motor Lower Extremity Assessment Lower Extremity Assessment: Defer to PT evaluation     Mobility Transfers Overall transfer level: Needs assistance Equipment used: 1 person hand held assist Transfers: Sit to/from Stand Sit to Stand: Max assist General transfer comment: Pt initially pushes posteriorly and with difficulty extending knees and hips.  On second attempt, she was able to demonstrate forward translation of trunk and achieve erect standing for brief moment     Exercise     Balance Balance Overall balance assessment: Needs assistance Sitting-balance support: Feet supported Sitting balance-Leahy Scale: Fair Standing balance support: Single extremity supported Standing balance-Leahy Scale: Zero   End of Session OT - End of Session Activity Tolerance: Patient limited by fatigue Patient left: in chair;with call bell/phone within reach;with chair alarm set  [place], Ellard Artis M 07/31/2013, 4:28 PM

## 2013-07-31 NOTE — Evaluation (Signed)
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   Clinical Impression  Pt adm due to the above. Presents with decreased functional mobility and deficits indicated below. Pt to benefit from skilled acute PT to maximize functional mobility and address deficits indicated below prior to D/C to [facility].     PT Assessment  Patient needs continued PT services    Follow Up Recommendations  SNF    Does the patient have the potential to tolerate intense rehabilitation      Barriers to Discharge        Equipment Recommendations  None recommended by PT    Recommendations for Other Services OT consult   Frequency Min 2X/week    Precautions / Restrictions Precautions Precautions: Fall Restrictions Weight Bearing Restrictions: No   Pertinent Vitals/Pain No complaints.     Mobility  Bed Mobility Overal bed mobility: Needs Assistance Bed Mobility: Supine to Sit Supine to sit: Max assist;HOB elevated General bed mobility comments: pt attempting to reach with Rt UE to (A) with bed mobility Transfers Overall transfer level: Needs assistance Equipment used: 1 person hand held assist Transfers: Sit to/from Stand;Stand Pivot Transfers Sit to Stand: Max assist Stand pivot transfers: Max assist General transfer comment: pt pushing up through Rt LE and UE; required max (A) to achieve standing position but is pushing posteriorly throughout; max (A) to perform pivotal steps to recliner          PT Diagnosis:  Generalized weakness  PT Problem List: Decreased strength;Decreased activity tolerance;Decreased balance;Decreased mobility;Decreased coordination;Decreased cognition;Impaired tone PT Treatment Interventions: Functional mobility training;Therapeutic activities;Therapeutic exercise;Balance training;Neuromuscular re-education;Patient/family education;Cognitive remediation     PT Goals(Current goals can be found in the care plan section) Acute Rehab PT Goals Patient Stated Goal: to get out of the bed so my lungs can get clear  PT Goal Formulation: With patient Time For Goal Achievement: 08/14/13 Potential to Achieve Goals: Good  Visit Information  Last PT Received On: 07/31/13 History of Present Illness: Resident of [facility], past medical history of type 2 diabetes, hypertension, gout, 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        Prior [place] expects to be discharged to:: Skilled nursing facility Type of Home: [place] Additional Comments: pt from [facility] and plans to return  Prior Function Level of Independence: Needs assistance Gait / Transfers Assistance Needed: pt reports 2 people would stand her and get her to her wheelchair  ADL's / Homemaking Assistance Needed: total (A)  Communication Communication: No difficulties Dominant Hand: Right    Cognition  Cognition Arousal/Alertness: Awake/alert Behavior During Therapy: WFL for tasks assessed/performed Overall Cognitive Status: Within Functional Limits for tasks assessed General Comments: pt slow to answer but answers appropriately and is oriented     Extremity/Trunk Assessment Upper Extremity Assessment Upper Extremity Assessment: LUE deficits/detail LUE Deficits / Details: flexion one in Lt UE due to  previous CVA Lower Extremity Assessment Lower  Extremity Assessment: Generalized weakness;LLE deficits/detail LLE Deficits / Details: grossly 1/5 in LE; increased flexor tone from previous CVA    Balance Balance Overall balance assessment: Needs assistance Sitting-balance support: Feet supported;Single extremity supported Sitting balance-Leahy Scale: Poor Sitting balance - Comments: required Rt UE support but was able to maintain balance at S level for 5 min  Postural control: Posterior lean Standing balance support: During functional activity;Bilateral upper extremity supported Standing balance-Leahy Scale: Zero Standing balance comment: max (A) required   End of Session PT - End of Session Equipment Utilized During Treatment: Gait belt Activity Tolerance: Patient tolerated treatment well Patient left: in chair;with call bell/phone within reach Nurse Communication: Mobility status  GP     Gustavus Bryant, [place] 07/31/2013, 1:00 PM

## 2013-07-31 NOTE — Progress Notes (Signed)
Report called to Lattie Haw, Therapist, sports. Medicated with Hydralazine 10mg  IV for systolic b/p of [id number].

## 2013-07-31 NOTE — Progress Notes (Signed)
Stable. No stools recorded, although pt states she had "about 3" brown stools during the night (?accuracy).  VSS.  No sign of bleeding.  Alert.  Hgb has dropped from 12.5 to 10.7, but I doubt this reflects bleeding in absence of clinical bleeding, and with stable VS, and dropping BUN.  This is more consistent with equilibration w/ correction of volume contraction.  Note that even a hgb of 10.7 is higher than expected following transfusion of 3 units of prc's for her presenting hgb of 5.7, so we might expect even a further decline.  I spoke with the patient's son, [name], on the phone yesterday afternoon, and discussed the pro's and con's of endoscopic evaluation.  We both favor observation rather than endoscopy, in the absence of clinical bleeding.  Recommendation:  1.  I will sign off; please call if we can be of further assistance.  2.  No objection to anticipated dischg tomorrow, assuming the patient remains stable in the interim.  3.  Recommend once-daily PPI forever following dischg.  4.  No resumption of Mobic for several weeks, and ideally permanently--this was suspected to be an NSAID-related bleed but that is conjectural.  IF Mobic or other NSAID is felt to be strongly needed in the future, would probably prophylax with TWICE-daily PPI.  5.  Would monitor hgb weekly for a month or so post dischg.  Cleotis Nipper, M.D. [phone]

## 2013-07-31 NOTE — Progress Notes (Signed)
Subjective: No complaints, no sign of bleeding  Objective: Vital signs in last 24 hours: Temp:  [97.8 F (36.6 C)-98.3 F (36.8 C)] 97.9 F (36.6 C) (03/20 0437) Pulse Rate:  [86-106] 95 (03/20 0006) Resp:  [20-27] 20 (03/20 0006) BP: (135-215)/(63-104) 215/85 mmHg (03/20 0437) SpO2:  [90 %-98 %] 96 % (03/20 0006) Weight:  [54.1 kg (119 lb 4.3 oz)] 54.1 kg (119 lb 4.3 oz) (03/20 0415) Weight change: -3.4 kg (-7 lb 7.9 oz) Last BM Date: 07/30/13  Intake/Output from previous day: 03/19 0701 - 03/20 0700 In: 662.5 [I.V.:562.5; IV Piggyback:100] Out: -  Intake/Output this shift:    General appearance: alert and cooperative Resp: clear to auscultation bilaterally Cardio: regular rate and rhythm, S1, S2 normal, no murmur, click, rub or gallop GI: soft, non-tender; bowel sounds normal; no masses,  no organomegaly Extremities: extremities normal, atraumatic, no cyanosis or edema  Lab Results:  Recent Labs  07/30/13 1620 07/31/13 0353  WBC 13.9* 10.3  HGB 12.5 10.7*  HCT 35.1* 30.7*  PLT 255 252   BMET  Recent Labs  07/30/13 0347 07/31/13 0353  NA 136* 145  K 4.3 3.7  CL 92* 105  CO2 23 23  GLUCOSE 192* 232*  BUN 97* 71*  CREATININE 1.36* 1.07  CALCIUM 10.2 10.3    Studies/Results: No results found.  Medications: I have reviewed the patient's current medications.  Assessment/Plan: Active Problems:  Acute encephalopathy: Looks to be secondary to hypoxia and/or the setting of significant blood loss anemia. REsolved Hypertension: Blood pressures elevated. Restart amlodipine Diabetes type 2, uncontrolled  GI bleed: Appreciate gastroenterology help. No sign active bleeding.  Patient and daughter have opted not to undergo EGD.  Treat with PPI and NSAID avoidance Anemia associated with acute blood loss improved post transfusion Prerenal Azotemia, improving, continue IVFs  Acute respiratory failure: Unclear etiology. Resolved, saturating in high 90s on RA.  If CXR  unchanged, D/C antibiotics Disposition.  OOB today.  Probable discharge tomorrow AM.  Transfer to non telemetry bed  Code Status: DO NOT RESUSCITATE  Family Communication:spoke with daughter   Disposition Plan: Back to facility possibly tomorrow    LOS: 2 days   [name] JOSEPH 07/31/2013, 7:50 AM

## 2013-07-31 NOTE — Progress Notes (Signed)
Pt transferred to 5W. I provided unit CSW a handoff, explaining ALF is not comfortable taking her back at ALF level of care and are requiring her to return to their facility at [facility] level. Facility to contact pt's son [name] to explain this, and CSW will also follow up with family and provide support. CSW will assist with discharge based on pt/family wishes in placement from the hospital. I am signing off.   Ky Barban, MSW, [facility] Clinical Social Worker [phone]

## 2013-07-31 NOTE — Clinical Documentation Improvement (Signed)
"  Prerenal Azotemia" documented in progress notes.  Admission BUN/Cr.   118/1.91  BUN/Cr 07/30/2013     97/1.36  BUN/Cr 07/31/12      71/1.07   Please document if a condition below provides greater specificity regarding the patient's renal function:   - Acute Kidney Injury/Acute Renal Failure, improved   - Other Condition   - Unable to Clinically Determine    Thank You,  Erling Conte ,RN BSN CCDS Certified Clinical Documentation Specialist:  [phone] [facility]

## 2013-08-01 LAB — GLUCOSE, CAPILLARY
GLUCOSE-CAPILLARY: 112 mg/dL — AB (ref 70–99)
GLUCOSE-CAPILLARY: 148 mg/dL — AB (ref 70–99)
GLUCOSE-CAPILLARY: 62 mg/dL — AB (ref 70–99)
GLUCOSE-CAPILLARY: 78 mg/dL (ref 70–99)
Glucose-Capillary: 107 mg/dL — ABNORMAL HIGH (ref 70–99)
Glucose-Capillary: 133 mg/dL — ABNORMAL HIGH (ref 70–99)
Glucose-Capillary: 49 mg/dL — ABNORMAL LOW (ref 70–99)
Glucose-Capillary: 78 mg/dL (ref 70–99)
Glucose-Capillary: 86 mg/dL (ref 70–99)

## 2013-08-01 LAB — BASIC METABOLIC PANEL
BUN: 38 mg/dL — ABNORMAL HIGH (ref 6–23)
CO2: 21 mEq/L (ref 19–32)
Calcium: 9.7 mg/dL (ref 8.4–10.5)
Chloride: 111 mEq/L (ref 96–112)
Creatinine, Ser: 0.86 mg/dL (ref 0.50–1.10)
GFR, EST AFRICAN AMERICAN: 71 mL/min — AB (ref >90)
GFR, EST NON AFRICAN AMERICAN: 61 mL/min — AB (ref >90)
GLUCOSE: 81 mg/dL (ref 70–99)
Potassium: 3.7 mEq/L (ref 3.7–5.3)
SODIUM: 147 meq/L (ref 137–147)

## 2013-08-01 LAB — CBC
HCT: 30.2 % — ABNORMAL LOW (ref 36.0–46.0)
Hemoglobin: 10.2 g/dL — ABNORMAL LOW (ref 12.0–15.0)
MCH: 29.7 pg (ref 26.0–34.0)
MCHC: 33.8 g/dL (ref 30.0–36.0)
MCV: 87.8 fL (ref 78.0–100.0)
PLATELETS: 265 10*3/uL (ref 150–400)
RBC: 3.44 MIL/uL — AB (ref 3.87–5.11)
RDW: 16.7 % — ABNORMAL HIGH (ref 11.5–15.5)
WBC: 13 10*3/uL — ABNORMAL HIGH (ref 4.0–10.5)

## 2013-08-01 MED ORDER — PANTOPRAZOLE SODIUM 40 MG PO TBEC: 40.0000 mg | DELAYED_RELEASE_TABLET | Freq: Every day | ORAL | Status: DC

## 2013-08-01 MED ORDER — LOSARTAN POTASSIUM 50 MG PO TABS
100.0000 mg | ORAL_TABLET | Freq: Every day | ORAL | Status: DC
  Administered 2013-08-01 – 2013-08-03 (×3): 100 mg via ORAL
  Filled 2013-08-01 (×3): qty 2

## 2013-08-01 NOTE — Progress Notes (Signed)
Subjective: No new complaints  Objective: Vital signs in last 24 hours: Temp:  [97.3 F (36.3 C)-98 F (36.7 C)] 97.6 F (36.4 C) (03/21 0626) Pulse Rate:  [68-100] 73 (03/21 0626) Resp:  [18-22] 20 (03/21 0626) BP: (151-196)/(56-146) 190/64 mmHg (03/21 0632) SpO2:  [96 %-99 %] 98 % (03/21 0626) Weight change:  Last BM Date: 07/31/13  Intake/Output from previous day: 03/20 0701 - 03/21 0700 In: 320 [P.O.:320] Out: -  Intake/Output this shift:    General appearance: alert Resp: few rhonchi Cardio: regular rate and rhythm, S1, S2 normal, no murmur, click, rub or gallop Extremities: extremities normal, atraumatic, no cyanosis or edema  Lab Results:  Recent Labs  07/31/13 0353 08/01/13 0520  WBC 10.3 13.0*  HGB 10.7* 10.2*  HCT 30.7* 30.2*  PLT 252 265   BMET  Recent Labs  07/31/13 0353 08/01/13 0520  NA 145 147  K 3.7 3.7  CL 105 111  CO2 23 21  GLUCOSE 232* 81  BUN 71* 38*  CREATININE 1.07 0.86  CALCIUM 10.3 9.7    Studies/Results: Dg Chest 2 View  07/31/2013   CLINICAL DATA:  Shortness of breath.  EXAM: CHEST  2 VIEW  COMPARISON:  07/29/2013 and 07/20/2013  FINDINGS: Heart size and pulmonary vascularity are normal and the lungs are clear. No significant osseous abnormality. No effusions.  IMPRESSION: No acute abnormalities.   Electronically Signed   By: Rozetta Nunnery M.D.   On: 07/31/2013 08:05    Medications: I have reviewed the patient's current medications.  Assessment/Plan: Active Problems:  Acute encephalopathy: Looks to be secondary to hypoxia and/or the setting of significant blood loss anemia. REsolved  Hypertension: Blood pressures elevated. Restarted amlodipine and bisoprolol, restart losartan, stop saline Diabetes type 2, uncontrolled  GI bleed: Appreciate gastroenterology help. No sign active bleeding. Patient and daughter have opted not to undergo EGD. Treat with PPI daily and NSAID avoidance  Anemia associated with acute blood loss  stable post transfusion  Acute renal failure, prerenal, improving, discontinue IVFs  Acute respiratory failure: Unclear etiology. Resolved, saturating in high 90s on RA.  CXR yesterday negative, D/Ced antibiotics  Disposition. Ready for discharge   Code Status: DO NOT RESUSCITATE  Family Communication:spoke with daughter yesterday Disposition Plan: [facility]    LOS: 3 days   [name] JOSEPH 08/01/2013, 8:13 AM

## 2013-08-01 NOTE — Progress Notes (Signed)
SBP 190 manually. Pt has Hydralazine PRN ordered but none in pyxis. Notified pharmacy and reported to on-coming RN.

## 2013-08-01 NOTE — Discharge Summary (Signed)
Physician Discharge Summary  Patient ID: [name] MRN: [id number] DOB/AGE: 1928-07-05 80 y.o.  Admit date: 07/29/2013 Discharge date: 08/01/2013  Admission Diagnoses: Acute encephalopathy Hypoxemia GI bleed Anemia, blood loss Diabetes mellitus type 2 Hypertension History of CVA  Discharge Diagnoses:  Active Problems:   Acute encephalopathy Hypoxemia GI bleed   Stroke   Hypertension   Diabetes type 2, uncontrolled   Anemia associated with acute blood loss    Discharged Condition: good  Hospital Course: The patient was admitted on March 18 with confusion and hypersomnolence. In ER she was found to be hypoxemic although her chest x-ray was negative. She also was found to have a hemoglobin of 5.5 with guaiac positive stools. BUN and creatinine were 118/1.91 She was placed on BiPAP and her mentation improved. She eventually was weaned down to oxygen and then off and oxygen saturations remained in the high 90s on room air. Followup chest x-ray showed no change. There was never any evidence of pneumonia. The patient was seen in consultation by Dr.  Cristina Gong of gastroenterology. His impression was profound anemia with heme positive stool but no evidence of frank GI bleeding with a history of exposure to nonsteroidal drugs without PPI prophylaxis. Gastroenterology felt the patient likely had a recent GI bleed with resolution before presentation to the hospital. The patient was treated with aggressive IV and then oral PPI therapy. Possible upper endoscopy was discussed with the family but the family opted against aggressive GI workup. The patient received 3 units of packed red blood cells with hemoglobin rising and stabilizing in 10-11 range. The patient will be discharged once daily PPI. Aspirin was held at discharge but could be restarted in the near future if she remains stable. Meloxicam was stopped, if deemed necessary in the future Dr. Cristina Gong recommended increasing pantoprazole to twice  a day dosing. At discharge the patient's mental status was back to baseline. Her blood pressure was running elevated as high as [id number] systolic and her oral anti-hypertensives were all restarted. She was treated with hydralazine as needed during hospitalization for blood pressure. Diabetes was treated as usual per outpatient. The patient's acute kidney injury/prerenal markedly improved with BUN of 38 and creatinine of 0.86 at discharge. The patient was seen by physical therapy and occupational therapy and discharge to skilled nursing facility was recommended and required by the facility. The patient had been assisted living prior to admission.  Consults: GI  Significant Diagnostic Studies: labs: At discharge sodium 147 potassium 3.7 chloride 111 bicarbonate 21 BUN 38 creatinine 0.86, hemoglobin 10.2 MCV 87.8 and radiology: CXR: normal  Treatments: IV hydration, insulin: Humalog and Lantus, therapies: PT and OT and procedures: Transfusion 3 units PRBC  Discharge Exam: Blood pressure 199/66, pulse 76, temperature 97.6 F (36.4 C), temperature source Oral, resp. rate 20, height 5' (1.524 m), weight 54.1 kg (119 lb 4.3 oz), SpO2 96.00%. General appearance: alert and cooperative Resp: clear to auscultation bilaterally GI: soft, non-tender; bowel sounds normal; no masses,  no organomegaly Extremities: extremities normal, atraumatic, no cyanosis or edema  Disposition: 04-skilled nursing facility     Medication List    STOP taking these medications       aspirin 325 MG tablet     meloxicam 7.5 MG tablet  Commonly known as:  MOBIC      TAKE these medications       allopurinol 100 MG tablet  Commonly known as:  ZYLOPRIM  Take 150 mg by mouth at bedtime.  amLODipine 5 MG tablet  Commonly known as:  NORVASC  Take 5 mg by mouth daily.     bisoprolol 10 MG tablet  Commonly known as:  ZEBETA  Take 1 tablet (10 mg total) by mouth daily.     carboxymethylcellulose 0.5 % Soln  Commonly  known as:  REFRESH PLUS  Place 1 drop into both eyes 2 (two) times daily.     HM VITAMIN D3 4000 UNITS Caps  Generic drug:  Cholecalciferol  Take 8,000 Units by mouth daily.     insulin aspart 100 UNIT/ML injection  Commonly known as:  novoLOG  Inject 0-12 Units into the skin 2 (two) times daily. Sliding scale <150=0 units 151-200=4 units 201-250=6 units 251-300=8 units 301-350=10 units 351-400=12 units     Insulin Glargine 100 UNIT/ML Solostar Pen  Commonly known as:  LANTUS SOLOSTAR  Inject 40 Units into the skin every morning.     LORazepam 0.5 MG tablet  Commonly known as:  ATIVAN  Take 0.5 mg by mouth every 6 (six) hours as needed for anxiety.     losartan 100 MG tablet  Commonly known as:  COZAAR  Take 1 tablet (100 mg total) by mouth daily.     pantoprazole 40 MG tablet  Commonly known as:  PROTONIX  Take 1 tablet (40 mg total) by mouth daily.     promethazine 25 MG suppository  Commonly known as:  PHENERGAN  Place 25 mg rectally every 6 (six) hours as needed for nausea.     promethazine 25 MG tablet  Commonly known as:  PHENERGAN  Take 25 mg by mouth every 6 (six) hours as needed for nausea.     simvastatin 10 MG tablet  Commonly known as:  ZOCOR  Take 1 tablet (10 mg total) by mouth at bedtime.     traZODone 50 MG tablet  Commonly known as:  DESYREL  Take 50 mg by mouth at bedtime.     VITAMIN B 12 PO  Take 1 tablet by mouth daily.           Follow-up Information   Follow up with GATES,ROBERT NEVILL, MD In 7 days. (Followup at nursing home)    Specialty:  Internal Medicine   Contact information:   [address] [place] [address] [phone]       Signed: Irven Shelling 08/01/2013, 8:21 AM

## 2013-08-02 LAB — GLUCOSE, CAPILLARY
GLUCOSE-CAPILLARY: 119 mg/dL — AB (ref 70–99)
GLUCOSE-CAPILLARY: 281 mg/dL — AB (ref 70–99)
GLUCOSE-CAPILLARY: 181 mg/dL — AB (ref 70–99)
Glucose-Capillary: 141 mg/dL — ABNORMAL HIGH (ref 70–99)
Glucose-Capillary: 142 mg/dL — ABNORMAL HIGH (ref 70–99)
Glucose-Capillary: 239 mg/dL — ABNORMAL HIGH (ref 70–99)
Glucose-Capillary: 65 mg/dL — ABNORMAL LOW (ref 70–99)

## 2013-08-02 NOTE — Progress Notes (Signed)
08/02/13 Order received for TWE from Dr. Laurann Montana, Given with good results.

## 2013-08-02 NOTE — Progress Notes (Signed)
Subjective: Wants to get out of bed  Objective: Vital signs in last 24 hours: Temp:  [97.4 F (36.3 C)-98.2 F (36.8 C)] 98.2 F (36.8 C) (03/22 0529) Pulse Rate:  [70-77] 77 (03/22 0529) Resp:  [20-21] 20 (03/22 0529) BP: (153-187)/(60-72) 187/72 mmHg (03/22 0529) SpO2:  [98 %-99 %] 99 % (03/22 0529) Weight change:  Last BM Date: 08/01/13  Intake/Output from previous day: 03/21 0701 - 03/22 0700 In: 2703 [I.V.:2703] Out: -  Intake/Output this shift:    General appearance: alert and cooperative  Lab Results:  Recent Labs  07/31/13 0353 08/01/13 0520  WBC 10.3 13.0*  HGB 10.7* 10.2*  HCT 30.7* 30.2*  PLT 252 265   BMET  Recent Labs  07/31/13 0353 08/01/13 0520  NA 145 147  K 3.7 3.7  CL 105 111  CO2 23 21  GLUCOSE 232* 81  BUN 71* 38*  CREATININE 1.07 0.86  CALCIUM 10.3 9.7    Studies/Results: No results found.  Medications: I have reviewed the patient's current medications.  Assessment/Plan: Active Problems:  Acute encephalopathy:  secondary to hypoxia and/or the setting of significant blood loss anemia. REsolved  Hypertension: Blood pressures improved, restarted outpatient meds Diabetes type 2, better control GI bleed: Appreciate gastroenterology help. No sign active bleeding. Patient and daughter have opted not to undergo EGD. Treat with PPI daily and NSAID avoidance  Acute renal failure, prerenal, improving, discontinued IVFs 3/21 Acute respiratory failure: Unclear etiology. Resolved, saturating in high 90s on RA. Repeat CXR  negative Disposition. Ready for discharge to [facility] tomorrow, dictated     LOS: 4 days   Laquinton Bihm JOSEPH 08/02/2013, 11:18 AM

## 2013-08-02 NOTE — Progress Notes (Signed)
CSW (Clinical Education officer, museum) spoke with pt about needing SNF at discharge. Pt does not remember conversation with previous CSW. At this time, pt is agreeable to returning to [facility]. CSW has sent all updated clinicals to facility.  [place], [place]

## 2013-08-03 LAB — GLUCOSE, CAPILLARY
GLUCOSE-CAPILLARY: 109 mg/dL — AB (ref 70–99)
Glucose-Capillary: 108 mg/dL — ABNORMAL HIGH (ref 70–99)
Glucose-Capillary: 66 mg/dL — ABNORMAL LOW (ref 70–99)
Glucose-Capillary: 96 mg/dL (ref 70–99)

## 2013-08-03 NOTE — Clinical Social Work Placement (Signed)
Clinical Social Work Department CLINICAL SOCIAL WORK PLACEMENT NOTE 08/03/2013  Patient:  [name], [name]  Account Number:  [id number] Admit date:  07/29/2013  Clinical Social Worker:  Kemper Durie, [place]  Date/time:  08/03/2013 10:32 AM  Clinical Social Work is seeking post-discharge placement for this patient at the following level of care:   SKILLED NURSING   (*CSW will update this form in Epic as items are completed)   08/02/2013  Patient/family provided with [employer] Department of Clinical Social Work's list of facilities offering this level of care within the geographic area requested by the patient (or if unable, by the patient's family).  08/02/2013  Patient/family informed of their freedom to choose among providers that offer the needed level of care, that participate in Medicare, Medicaid or managed care program needed by the patient, have an available bed and are willing to accept the patient.  08/02/2013  Patient/family informed of [facility]' ownership interest in [facility], as well as of the fact that they are under no obligation to receive care at this facility.  PASARR submitted to EDS on  PASARR number received from [place] on   FL2 transmitted to all facilities in geographic area requested by pt/family on  08/02/2013 FL2 transmitted to all facilities within larger geographic area on   Patient informed that his/her managed care company has contracts with or will negotiate with  certain facilities, including the following:     Patient/family informed of bed offers received:  08/02/2013 Patient chooses bed at [facility] [place] Physician recommends and patient chooses bed at    Patient to be transferred to [place] on  08/03/2013 Patient to be transferred to facility by Ambulance  The following physician request were entered in Epic:   Additional Comments: Per MD patient ready to DC to  [facility]. Patient, RN, and facility notified of DC. DC packet on chart. RN given number for report. Ambulance transport requested for 10:45 AM. CSW signing off at this time.   Liz [place], [place], [place], [phone]

## 2013-08-03 NOTE — Progress Notes (Signed)
Hypoglycemic Event  CBG: 66  Treatment: orange juice 4oz  Symptoms: none  Follow-up CBG: Time:0035 CBG Result:96  Possible Reasons for Event: Unknown  Comments/MD notified: will continue to monitor patient    Kristian Mogg, Josephine Cables  Remember to initiate Hypoglycemia Order Set & complete

## 2013-08-03 NOTE — Progress Notes (Signed)
Subjective: Patient is stable and ready for discharge to [facility] skilled nursing facility today. No changes in clinical status from yesterday  Objective: Weight change:   Intake/Output Summary (Last 24 hours) at 08/03/13 0733 Last data filed at 08/02/13 2240  Gross per 24 hour  Intake      3 ml  Output      0 ml  Net      3 ml   Filed Vitals:   08/02/13 0529 08/02/13 1541 08/02/13 2033 08/03/13 0500  BP: 187/72 148/61 153/64 142/67  Pulse: 77 69 64 68  Temp: 98.2 F (36.8 C) 98.1 F (36.7 C) 98 F (36.7 C) 97.6 F (36.4 C)  TempSrc: Oral Oral Oral Oral  Resp: 20 18 18 18   Height:      Weight:      SpO2: 99% 99% 98% 97%    General Appearance: Alert, cooperative, no distress, appears stated age  Lab Results: Results for orders placed during the hospital encounter of 07/29/13 (from the past 48 hour(s))  GLUCOSE, CAPILLARY     Status: Abnormal   Collection Time    08/01/13  7:36 AM      Result Value Ref Range   Glucose-Capillary 112 (*) 70 - 99 mg/dL  GLUCOSE, CAPILLARY     Status: Abnormal   Collection Time    08/01/13 11:40 AM      Result Value Ref Range   Glucose-Capillary 148 (*) 70 - 99 mg/dL  GLUCOSE, CAPILLARY     Status: Abnormal   Collection Time    08/01/13  3:59 PM      Result Value Ref Range   Glucose-Capillary 107 (*) 70 - 99 mg/dL  GLUCOSE, CAPILLARY     Status: Abnormal   Collection Time    08/01/13  7:56 PM      Result Value Ref Range   Glucose-Capillary 49 (*) 70 - 99 mg/dL  GLUCOSE, CAPILLARY     Status: Abnormal   Collection Time    08/01/13  8:12 PM      Result Value Ref Range   Glucose-Capillary 62 (*) 70 - 99 mg/dL   Comment 1 Documented in Chart     Comment 2 Notify RN    GLUCOSE, CAPILLARY     Status: None   Collection Time    08/01/13  8:36 PM      Result Value Ref Range   Glucose-Capillary 78  70 - 99 mg/dL   Comment 1 Documented in Chart     Comment 2 Notify RN    GLUCOSE, CAPILLARY     Status: Abnormal   Collection Time     08/01/13 10:10 PM      Result Value Ref Range   Glucose-Capillary 133 (*) 70 - 99 mg/dL   Comment 1 Documented in Chart     Comment 2 Notify RN    GLUCOSE, CAPILLARY     Status: Abnormal   Collection Time    08/02/13 12:23 AM      Result Value Ref Range   Glucose-Capillary 239 (*) 70 - 99 mg/dL  GLUCOSE, CAPILLARY     Status: Abnormal   Collection Time    08/02/13  4:01 AM      Result Value Ref Range   Glucose-Capillary 141 (*) 70 - 99 mg/dL   Comment 1 Documented in Chart     Comment 2 Notify RN    GLUCOSE, CAPILLARY     Status: Abnormal   Collection Time  08/02/13  7:40 AM      Result Value Ref Range   Glucose-Capillary 65 (*) 70 - 99 mg/dL  GLUCOSE, CAPILLARY     Status: Abnormal   Collection Time    08/02/13  8:17 AM      Result Value Ref Range   Glucose-Capillary 119 (*) 70 - 99 mg/dL  GLUCOSE, CAPILLARY     Status: Abnormal   Collection Time    08/02/13 12:37 PM      Result Value Ref Range   Glucose-Capillary 281 (*) 70 - 99 mg/dL  GLUCOSE, CAPILLARY     Status: Abnormal   Collection Time    08/02/13  4:38 PM      Result Value Ref Range   Glucose-Capillary 181 (*) 70 - 99 mg/dL  GLUCOSE, CAPILLARY     Status: Abnormal   Collection Time    08/02/13  7:51 PM      Result Value Ref Range   Glucose-Capillary 142 (*) 70 - 99 mg/dL  GLUCOSE, CAPILLARY     Status: Abnormal   Collection Time    08/03/13 12:16 AM      Result Value Ref Range   Glucose-Capillary 66 (*) 70 - 99 mg/dL   Comment 1 Documented in Chart     Comment 2 Notify RN    GLUCOSE, CAPILLARY     Status: None   Collection Time    08/03/13 12:35 AM      Result Value Ref Range   Glucose-Capillary 96  70 - 99 mg/dL   Comment 1 Documented in Chart     Comment 2 Notify RN    GLUCOSE, CAPILLARY     Status: Abnormal   Collection Time    08/03/13  5:09 AM      Result Value Ref Range   Glucose-Capillary 108 (*) 70 - 99 mg/dL   Comment 1 Documented in Chart     Comment 2 Notify RN       Studies/Results: No results found. Medications: Scheduled Meds: . allopurinol  100 mg Oral Daily  . amLODipine  5 mg Oral Daily  . bisoprolol  10 mg Oral Daily  . insulin aspart  0-9 Units Subcutaneous 6 times per day  . insulin glargine  40 Units Subcutaneous [id number]  . losartan  100 mg Oral Daily  . pantoprazole  40 mg Oral BID  . polyvinyl alcohol  1 drop Both Eyes BID  . simvastatin  10 mg Oral QHS  . sodium chloride  3 mL Intravenous Q12H   Continuous Infusions:  PRN Meds:.albuterol, calcium carbonate, hydrALAZINE, sodium chloride  Assessment/Plan:  Active Problems:  Acute encephalopathy: secondary to hypoxia and/or the setting of significant blood loss anemia. Resolved  Hypertension: Blood pressures improved, now on usual outpatient meds  Diabetes type 2, better control  GI bleed: Appreciate gastroenterology help. No sign active bleeding. Patient and daughter opted not to undergo EGD. Treat with PPI daily and NSAID avoidance  Acute renal failure, prerenal, improving, discontinued IVFs 3/21  Acute respiratory failure: Unclear etiology. Resolved, saturating in high 90s on RA. Repeat CXR negative  Disposition. Ready for discharge to [facility] today. Discharge summary dictated 2 days ago. No new changes to therapy    LOS: 5 days   Henrine Screws, MD 08/03/2013, 7:33 AM

## 2013-08-03 NOTE — Progress Notes (Signed)
Nsg Discharge Note  Admit Date:  07/29/2013 Discharge date: 08/03/2013   [name] to be D/C'd to nursing home, per MD order.  AVS completed.  Copy for chart, and copy for patient signed, and dated. Patient/caregiver able to verbalize understanding.  Discharge Medication:   Medication List    STOP taking these medications       aspirin 325 MG tablet     meloxicam 7.5 MG tablet  Commonly known as:  MOBIC      TAKE these medications       allopurinol 100 MG tablet  Commonly known as:  ZYLOPRIM  Take 150 mg by mouth at bedtime.     amLODipine 5 MG tablet  Commonly known as:  NORVASC  Take 5 mg by mouth daily.     bisoprolol 10 MG tablet  Commonly known as:  ZEBETA  Take 1 tablet (10 mg total) by mouth daily.     carboxymethylcellulose 0.5 % Soln  Commonly known as:  REFRESH PLUS  Place 1 drop into both eyes 2 (two) times daily.     HM VITAMIN D3 4000 UNITS Caps  Generic drug:  Cholecalciferol  Take 8,000 Units by mouth daily.     insulin aspart 100 UNIT/ML injection  Commonly known as:  novoLOG  Inject 0-12 Units into the skin 2 (two) times daily. Sliding scale <150=0 units 151-200=4 units 201-250=6 units 251-300=8 units 301-350=10 units 351-400=12 units     Insulin Glargine 100 UNIT/ML Solostar Pen  Commonly known as:  LANTUS SOLOSTAR  Inject 40 Units into the skin every morning.     LORazepam 0.5 MG tablet  Commonly known as:  ATIVAN  Take 0.5 mg by mouth every 6 (six) hours as needed for anxiety.     losartan 100 MG tablet  Commonly known as:  COZAAR  Take 1 tablet (100 mg total) by mouth daily.     pantoprazole 40 MG tablet  Commonly known as:  PROTONIX  Take 1 tablet (40 mg total) by mouth daily.     promethazine 25 MG suppository  Commonly known as:  PHENERGAN  Place 25 mg rectally every 6 (six) hours as needed for nausea.     promethazine 25 MG tablet  Commonly known as:  PHENERGAN  Take 25 mg by mouth every 6 (six) hours as needed for  nausea.     simvastatin 10 MG tablet  Commonly known as:  ZOCOR  Take 1 tablet (10 mg total) by mouth at bedtime.     traZODone 50 MG tablet  Commonly known as:  DESYREL  Take 50 mg by mouth at bedtime.     VITAMIN B 12 PO  Take 1 tablet by mouth daily.        Discharge Assessment: Filed Vitals:   08/03/13 1008  BP: 166/70  Pulse:   Temp:   Resp:    Skin clean, dry and intact without evidence of skin break down, no evidence of skin tears noted. IV catheter discontinued intact. Site without signs and symptoms of complications - no redness or edema noted at insertion site, patient denies c/o pain - only slight tenderness at site.  Dressing with slight pressure applied.  D/c Instructions-Education: Discharge instructions given to patient/family with verbalized understanding. D/c education completed with patient/family including follow up instructions, medication list, d/c activities limitations if indicated, with other d/c instructions as indicated by MD - patient able to verbalize understanding, all questions fully answered. Patient instructed to return to ED,  call 911, or call MD for any changes in condition.  Patient escorted via EMS, report called into [facility], [place] 08/03/2013 11:18 AM

## 2013-08-04 LAB — CULTURE, BLOOD (ROUTINE X 2)
Culture: NO GROWTH
Culture: NO GROWTH

## 2013-08-04 NOTE — Care Management Note (Signed)
    Page 1 of 1   08/04/2013     8:19:56 AM   CARE MANAGEMENT NOTE 08/04/2013  Patient:  [name], [name]   Account Number:  [id number]  Date Initiated:  08/03/2013  Documentation initiated by:  Tomi Bamberger  Subjective/Objective Assessment:   dx encephalopathy , hypoxia, gib  admit- From [facility]     Action/Plan:   pt eval- snf   Anticipated DC Date:  08/03/2013   Anticipated DC Plan:  SKILLED NURSING FACILITY  In-house referral  Clinical Social Worker      DC Planning Services  CM consult      Choice offered to / List presented to:             Status of service:  Completed, signed off Medicare Important Message given?   (If response is "NO", the following Medicare IM given date fields will be blank) Date Medicare IM given:   Date Additional Medicare IM given:    Discharge Disposition:  [place]  Per UR Regulation:  Reviewed for med. necessity/level of care/duration of stay  If discussed at [place] of Stay Meetings, dates discussed:    Comments:

## 2015-05-19 ENCOUNTER — Emergency Department (HOSPITAL_COMMUNITY): Payer: Medicare Other

## 2015-05-19 ENCOUNTER — Emergency Department (HOSPITAL_COMMUNITY)
Admission: EM | Admit: 2015-05-19 | Discharge: 2015-05-19 | Disposition: A | Discharge status: Home / Self Care | Payer: Medicare Other | Attending: Emergency Medicine | Admitting: Emergency Medicine

## 2015-05-19 ENCOUNTER — Encounter (HOSPITAL_COMMUNITY): Payer: Self-pay

## 2015-05-19 DIAGNOSIS — Z87448 Personal history of other diseases of urinary system: Secondary | ICD-10-CM | POA: Insufficient documentation

## 2015-05-19 DIAGNOSIS — Z79899 Other long term (current) drug therapy: Secondary | ICD-10-CM | POA: Diagnosis not present

## 2015-05-19 DIAGNOSIS — S0101XA Laceration without foreign body of scalp, initial encounter: Secondary | ICD-10-CM | POA: Insufficient documentation

## 2015-05-19 DIAGNOSIS — Y92129 Unspecified place in nursing home as the place of occurrence of the external cause: Secondary | ICD-10-CM | POA: Diagnosis not present

## 2015-05-19 DIAGNOSIS — Z7951 Long term (current) use of inhaled steroids: Secondary | ICD-10-CM | POA: Diagnosis not present

## 2015-05-19 DIAGNOSIS — Y9301 Activity, walking, marching and hiking: Secondary | ICD-10-CM | POA: Insufficient documentation

## 2015-05-19 DIAGNOSIS — Z87891 Personal history of nicotine dependence: Secondary | ICD-10-CM | POA: Insufficient documentation

## 2015-05-19 DIAGNOSIS — Z8542 Personal history of malignant neoplasm of other parts of uterus: Secondary | ICD-10-CM | POA: Insufficient documentation

## 2015-05-19 DIAGNOSIS — Z794 Long term (current) use of insulin: Secondary | ICD-10-CM | POA: Diagnosis not present

## 2015-05-19 DIAGNOSIS — Y998 Other external cause status: Secondary | ICD-10-CM | POA: Insufficient documentation

## 2015-05-19 DIAGNOSIS — S0191XA Laceration without foreign body of unspecified part of head, initial encounter: Secondary | ICD-10-CM

## 2015-05-19 DIAGNOSIS — W010XXA Fall on same level from slipping, tripping and stumbling without subsequent striking against object, initial encounter: Secondary | ICD-10-CM | POA: Insufficient documentation

## 2015-05-19 DIAGNOSIS — Z8673 Personal history of transient ischemic attack (TIA), and cerebral infarction without residual deficits: Secondary | ICD-10-CM | POA: Insufficient documentation

## 2015-05-19 DIAGNOSIS — Z8701 Personal history of pneumonia (recurrent): Secondary | ICD-10-CM | POA: Insufficient documentation

## 2015-05-19 DIAGNOSIS — R600 Localized edema: Secondary | ICD-10-CM | POA: Diagnosis not present

## 2015-05-19 DIAGNOSIS — M109 Gout, unspecified: Secondary | ICD-10-CM | POA: Insufficient documentation

## 2015-05-19 DIAGNOSIS — I10 Essential (primary) hypertension: Secondary | ICD-10-CM | POA: Insufficient documentation

## 2015-05-19 DIAGNOSIS — S0990XA Unspecified injury of head, initial encounter: Secondary | ICD-10-CM | POA: Diagnosis present

## 2015-05-19 DIAGNOSIS — Y92009 Unspecified place in unspecified non-institutional (private) residence as the place of occurrence of the external cause: Secondary | ICD-10-CM

## 2015-05-19 DIAGNOSIS — Z23 Encounter for immunization: Secondary | ICD-10-CM | POA: Diagnosis not present

## 2015-05-19 DIAGNOSIS — Z880 Allergy status to penicillin: Secondary | ICD-10-CM | POA: Insufficient documentation

## 2015-05-19 DIAGNOSIS — W19XXXA Unspecified fall, initial encounter: Secondary | ICD-10-CM

## 2015-05-19 DIAGNOSIS — E119 Type 2 diabetes mellitus without complications: Secondary | ICD-10-CM | POA: Insufficient documentation

## 2015-05-19 MED ORDER — TETANUS-DIPHTH-ACELL PERTUSSIS 5-2.5-18.5 LF-MCG/0.5 IM SUSP
0.5000 mL | Freq: Once | INTRAMUSCULAR | Status: AC
  Administered 2015-05-19: 0.5 mL via INTRAMUSCULAR
  Filled 2015-05-19: qty 0.5

## 2015-05-19 MED ORDER — LIDOCAINE HCL (PF) 1 % IJ SOLN
5.0000 mL | Freq: Once | INTRAMUSCULAR | Status: DC
  Filled 2015-05-19: qty 5

## 2015-05-19 NOTE — Discharge Instructions (Signed)
Please go to your Primary Care Physician, an Urgent Care or return to the Emergency Department to have your staples or sutures removed 7 days from today. No soaking wound (do not submerge head during baths) until staples are removed.   Laceration Care, Adult A laceration is a cut that goes through all of the layers of the skin and into the tissue that is right under the skin. Some lacerations heal on their own. Others need to be closed with stitches (sutures), staples, skin adhesive strips, or skin glue. Proper laceration care minimizes the risk of infection and helps the laceration to heal better. HOW TO CARE FOR YOUR LACERATION If sutures or staples were used:  Keep the wound clean and dry.  If you were given a bandage (dressing), you should change it at least one time per day or as told by your health care provider. You should also change it if it becomes wet or dirty.  Keep the wound completely dry for the first 24 hours or as told by your health care provider. After that time, you may shower or bathe. However, make sure that the wound is NOT soaked in water until after the sutures or staples have been removed.  Clean the wound one time each day or as told by your health care provider:  Wash the wound with soap and water.  Rinse the wound with water to remove all soap.  Pat the wound dry with a clean towel. Do not rub the wound.  After cleaning the wound, apply a thin layer of antibiotic ointmentas told by your health care provider. This will help to prevent infection and keep the dressing from sticking to the wound.  Have the sutures or staples removed as told by your health care provider. If skin adhesive strips were used:  Keep the wound clean and dry.  If you were given a bandage (dressing), you should change it at least one time per day or as told by your health care provider. You should also change it if it becomes dirty or wet.  Do not get the skin adhesive strips wet. You  may shower or bathe, but be careful to keep the wound dry.  If the wound gets wet, pat it dry with a clean towel. Do not rub the wound.  Skin adhesive strips fall off on their own. You may trim the strips as the wound heals. Do not remove skin adhesive strips that are still stuck to the wound. They will fall off in time. If skin glue was used:  Try to keep the wound dry, but you may briefly wet it in the shower or bath. Do not soak the wound in water, such as by swimming.  After you have showered or bathed, gently pat the wound dry with a clean towel. Do not rub the wound.  Do not do any activities that will make you sweat heavily until the skin glue has fallen off on its own.  Do not apply liquid, cream, or ointment medicine to the wound while the skin glue is in place. Using those may loosen the film before the wound has healed.  If you were given a bandage (dressing), you should change it at least one time per day or as told by your health care provider. You should also change it if it becomes dirty or wet.  If a dressing is placed over the wound, be careful not to apply tape directly over the skin glue. Doing that may cause  the glue to be pulled off before the wound has healed.  Do not pick at the glue. The skin glue usually remains in place for 5-10 days, then it falls off of the skin. General Instructions  Take over-the-counter and prescription medicines only as told by your health care provider.  If you were prescribed an antibiotic medicine or ointment, take or apply it as told by your doctor. Do not stop using it even if your condition improves.  To help prevent scarring, make sure to cover your wound with sunscreen whenever you are outside after stitches are removed, after adhesive strips are removed, or when glue remains in place and the wound is healed. Make sure to wear a sunscreen of at least 30 SPF.  Do not scratch or pick at the wound.  Keep all follow-up visits as told  by your health care provider. This is important.  Check your wound every day for signs of infection. Watch for:  Redness, swelling, or pain.  Fluid, blood, or pus.  Raise (elevate) the injured area above the level of your heart while you are sitting or lying down, if possible. SEEK MEDICAL CARE IF:  You received a tetanus shot and you have swelling, severe pain, redness, or bleeding at the injection site.  You have a fever.  A wound that was closed breaks open.  You notice a bad smell coming from your wound or your dressing.  You notice something coming out of the wound, such as wood or glass.  Your pain is not controlled with medicine.  You have increased redness, swelling, or pain at the site of your wound.  You have fluid, blood, or pus coming from your wound.  You notice a change in the color of your skin near your wound.  You need to change the dressing frequently due to fluid, blood, or pus draining from the wound.  You develop a new rash.  You develop numbness around the wound. SEEK IMMEDIATE MEDICAL CARE IF:  You develop severe swelling around the wound.  Your pain suddenly increases and is severe.  You develop painful lumps near the wound or on skin that is anywhere on your body.  You have a red streak going away from your wound.  The wound is on your hand or foot and you cannot properly move a finger or toe.  The wound is on your hand or foot and you notice that your fingers or toes look pale or bluish.   This information is not intended to replace advice given to you by your health care provider. Make sure you discuss any questions you have with your health care provider.   Document Released: 04/30/2005 Document Revised: 09/14/2014 Document Reviewed: 04/26/2014 Elsevier Interactive Patient Education [employer].

## 2015-05-19 NOTE — ED Provider Notes (Signed)
CSN: [id number]     Arrival date & time 05/19/15  1140 History   First MD Initiated Contact with Patient 05/19/15 1144     Chief Complaint  Patient presents with  . Fall   82 year old Caucasian female with past medical history of diabetes, stroke with left-sided deficits, and HTN who presents today from nursing facility for mechanical fall. Patient says she's been trying to increase her strength and when the nurse left decided to walk without assistance. Used her walker but normally has a Marine scientist with her. While walking her foot got caught up and she tripped to her right side. Had no LOC. Denies any pain. No fevers, chills, nausea, vomiting, vision change, headache, neck pain, tingling, numbness, back pain.  (Consider location/radiation/quality/duration/timing/severity/associated sxs/prior Treatment) Patient is a 82 y.o. female presenting with fall.  Fall This is a new problem. The current episode started today. The problem occurs rarely. The problem has been unchanged. Associated symptoms include numbness. Pertinent negatives include no abdominal pain, chest pain, chills, fever, headaches, nausea, vomiting or weakness. Nothing aggravates the symptoms. She has tried nothing for the symptoms. The treatment provided no relief.    Past Medical History  Diagnosis Date  . Diabetes mellitus   . Hypertension   . Gout   . Pneumonia   . Stroke ([place])     L sided deficits w/slurred speech  . Cancer ([place]) 07/04/10    endometrial  . Falling episodes   . Hx of radiation therapy 09/13/10- 10/19/10, 6/12, 6/19, 11/14/10    pelvis, external beam tehn brachytherapy  . Renal disorder    Past Surgical History  Procedure Laterality Date  . Abdominal hysterectomy     Family History  Problem Relation Age of Onset  . Hypertension Mother   . Hypertension Father    Social History  Substance Use Topics  . Smoking status: Former Smoker    Types: Cigarettes  . Smokeless tobacco: None     Comment: quit many yrs  ago  . Alcohol Use: No   OB History    Gravida Para Term Preterm AB TAB SAB Ectopic Multiple Living   2 2             Review of Systems  Constitutional: Negative for fever and chills.  Respiratory: Negative for shortness of breath.   Cardiovascular: Negative for chest pain, palpitations and leg swelling.  Gastrointestinal: Negative for nausea, vomiting, abdominal pain, diarrhea, constipation and abdominal distention.  Genitourinary: Negative for dysuria, frequency, flank pain and decreased urine volume.  Neurological: Positive for numbness. Negative for dizziness, facial asymmetry, speech difficulty, weakness, light-headedness and headaches.  All other systems reviewed and are negative.     Allergies  Penicillins  Home Medications   Prior to Admission medications   Medication Sig Start Date End Date Taking? Authorizing Provider  acetaminophen (TYLENOL) 325 MG tablet Take 650 mg by mouth every 8 (eight) hours as needed for moderate pain.   Yes Historical Provider, MD  allopurinol (ZYLOPRIM) 100 MG tablet Take 150 mg by mouth at bedtime.   Yes Historical Provider, MD  amLODipine (NORVASC) 5 MG tablet Take 2.5 mg by mouth daily.    Yes Historical Provider, MD  bisoprolol (ZEBETA) 10 MG tablet Take 1 tablet (10 mg total) by mouth daily. 07/23/13  Yes Josetta Huddle, MD  carboxymethylcellulose (REFRESH PLUS) 0.5 % SOLN Place 1 drop into both eyes 2 (two) times daily.   Yes Historical Provider, MD  Cholecalciferol (HM VITAMIN D3) 4000 UNITS CAPS  Take 8,000 Units by mouth daily.   Yes Historical Provider, MD  Cyanocobalamin (VITAMIN B 12 PO) Take 1 tablet by mouth daily.    Yes Historical Provider, MD  ferrous sulfate 325 (65 FE) MG tablet Take 325 mg by mouth daily with breakfast.   Yes Historical Provider, MD  fluticasone (FLONASE) 50 MCG/ACT nasal spray Place 1 spray into both nostrils daily.   Yes Historical Provider, MD  gabapentin (NEURONTIN) 400 MG capsule Take 400 mg by mouth at  bedtime.   Yes Historical Provider, MD  hydrALAZINE (APRESOLINE) 25 MG tablet Take 25 mg by mouth 2 (two) times daily.   Yes Historical Provider, MD  Insulin Glargine (LANTUS SOLOSTAR) 100 UNIT/ML Solostar Pen Inject 40 Units into the skin every morning. Patient taking differently: Inject 80 Units into the skin at bedtime.  07/23/13  Yes Josetta Huddle, MD  insulin lispro (HUMALOG) 100 UNIT/ML injection Inject 0-15 Units into the skin 2 (two) times daily. <150=0u, 151-200=4u, 201-250=6u, 251-300=8u, 301-350=10u, 351-400=12u, >400=15u   Rotate sites   Yes Historical Provider, MD  linagliptin (TRADJENTA) 5 MG TABS tablet Take 5 mg by mouth daily.   Yes Historical Provider, MD  LORazepam (ATIVAN) 0.5 MG tablet Take 0.5 mg by mouth at bedtime.    Yes Historical Provider, MD  losartan (COZAAR) 100 MG tablet Take 1 tablet (100 mg total) by mouth daily. 07/23/13  Yes Josetta Huddle, MD  metFORMIN (GLUCOPHAGE) 500 MG tablet Take 500 mg by mouth daily with breakfast.   Yes Historical Provider, MD  pantoprazole (PROTONIX) 40 MG tablet Take 1 tablet (40 mg total) by mouth daily. 08/01/13  Yes Lavone Orn, MD  promethazine (PHENERGAN) 25 MG suppository Place 25 mg rectally every 6 (six) hours as needed for nausea.   Yes Historical Provider, MD  promethazine (PHENERGAN) 25 MG tablet Take 25 mg by mouth every 6 (six) hours as needed for nausea.   Yes Historical Provider, MD  rOPINIRole (REQUIP) 1 MG tablet Take 1 mg by mouth 2 (two) times daily.   Yes Historical Provider, MD  simvastatin (ZOCOR) 10 MG tablet Take 1 tablet (10 mg total) by mouth at bedtime. 07/24/13  Yes Josetta Huddle, MD  traMADol (ULTRAM) 50 MG tablet Take 25-50 mg by mouth 3 (three) times daily. 1/2 tablet twice daily as needed for pain, and 1 tablet at bedtime   Yes Historical Provider, MD  traZODone (DESYREL) 150 MG tablet Take 150 mg by mouth at bedtime.   Yes Historical Provider, MD   BP 221/75 mmHg  Pulse 65  Resp 16  Ht 4\' 11"  (1.499 m)  Wt  54.432 kg  BMI 24.22 kg/m2  SpO2 98% Physical Exam  Constitutional: She is oriented to person, place, and time. She appears well-developed and well-nourished. No distress.  HENT:  Head: Normocephalic.  Small lac, 1cm to right parietal scalp. No active bleeding. No other signs of trauma. No facial brusing, tenderness, or swelling.   Eyes: Pupils are equal, round, and reactive to light.  Cardiovascular: Normal rate, regular rhythm, normal heart sounds and intact distal pulses.  Exam reveals no gallop and no friction rub.   No murmur heard. Pulmonary/Chest: Effort normal and breath sounds normal. No respiratory distress. She has no wheezes. She has no rales. She exhibits no tenderness.  Abdominal: Soft. Bowel sounds are normal. She exhibits no distension and no mass. There is no tenderness. There is no rebound and no guarding.  Musculoskeletal: Normal range of motion. She exhibits edema (1+ pitting  edema to bilateral LEs). She exhibits no tenderness.  Lymphadenopathy:    She has no cervical adenopathy.  Neurological: She is alert and oriented to person, place, and time. No cranial nerve deficit. Coordination normal.  Left leg weakness, 3/5. Chronic.  Skin: Skin is warm and dry. She is not diaphoretic.  Nursing note and vitals reviewed.   ED Course  .Marland KitchenLaceration Repair Date/Time: 05/19/2015 2:26 PM Performed by: Sherian Maroon Authorized by: Sherian Maroon Consent: Verbal consent obtained. Time out: Immediately prior to procedure a "time out" was called to verify the correct patient, procedure, equipment, support staff and site/side marked as required. Body area: head/neck Location details: scalp Laceration length: 2 cm Tendon involvement: none Nerve involvement: none Vascular damage: no Anesthesia: local infiltration Local anesthetic: lidocaine 1% without epinephrine Anesthetic total: 3 ml Patient sedated: no Irrigation solution: saline Irrigation method: syringe Amount of cleaning:  standard Debridement: none Degree of undermining: none Number of sutures: 3 (staples) Dressing: 4x4 sterile gauze, antibiotic ointment and gauze roll Patient tolerance: Patient tolerated the procedure well with no immediate complications   (including critical care time) Labs Review Labs Reviewed - No data to display  Imaging Review Dg Chest 2 View  05/19/2015  CLINICAL DATA:  Status post fall, no chest complaints EXAM: CHEST  2 VIEW COMPARISON:  07/31/2013 FINDINGS: The heart size and mediastinal contours are within normal limits. Both lungs are clear. The visualized skeletal structures are unremarkable. IMPRESSION: No active cardiopulmonary disease. Electronically Signed   By: Kathreen Devoid   On: 05/19/2015 13:22   Dg Pelvis 1-2 Views  05/19/2015  CLINICAL DATA:  Fall. EXAM: PELVIS - 1-2 VIEW COMPARISON:  None. FINDINGS: Degenerative changes lumbar spine and both hips. No acute bony abnormality identified. No evidence fracture or dislocation . Aortoiliac atherosclerotic vascular calcification. Pelvic calcifications consistent phleboliths. IMPRESSION: 1. Degenerative changes lumbar spine and both hips. No acute bony abnormality. 2.  Aortoiliac atherosclerotic vascular disease . Electronically Signed   By: [place]   On: 05/19/2015 13:23   Ct Head Wo Contrast  05/19/2015  CLINICAL DATA:  STATUS POST FALL.  HEAD LACERATION. EXAM: CT HEAD WITHOUT CONTRAST CT CERVICAL SPINE WITHOUT CONTRAST TECHNIQUE: Multidetector CT imaging of the head and cervical spine was performed following the standard protocol without intravenous contrast. Multiplanar CT image reconstructions of the cervical spine were also generated. COMPARISON:  07/20/2013 FINDINGS: CT HEAD FINDINGS There is no evidence of mass effect, midline shift, or extra-axial fluid collections. There is no evidence of a space-occupying lesion or intracranial hemorrhage. There is no evidence of a cortical-based area of acute infarction. There is  generalized cerebral atrophy. There is periventricular white matter low attenuation likely secondary to microangiopathy. The ventricles and sulci are appropriate for the patient's age. The basal cisterns are patent. Visualized portions of the orbits are unremarkable. There is near complete opacification of knee visualized left maxillary sinus. There is right ethmoid sinus mucosal thickening. Cerebrovascular atherosclerotic calcifications are noted. The osseous structures are unremarkable. CT CERVICAL SPINE FINDINGS The alignment is anatomic. The vertebral body heights are maintained. There is no acute fracture. There is no static listhesis. The prevertebral soft tissues are normal. The intraspinal soft tissues are not fully imaged on this examination due to poor soft tissue contrast, but there is no gross soft tissue abnormality. There is severe degenerative disc disease at C3-4, C4-5, C5-6 and C6-7. Bilateral uncovertebral degenerative changes and facet arthropathy with mild bilateral foraminal stenosis at C5-6. There is mild calcified right upper  pleural plaque. There is bilateral carotid artery atherosclerosis. IMPRESSION: 1. No acute intracranial pathology. 2. No acute osseous injury of the cervical spine. Electronically Signed   By: Kathreen Devoid   On: 05/19/2015 14:13   Ct Cervical Spine Wo Contrast  05/19/2015  CLINICAL DATA:  STATUS POST FALL.  HEAD LACERATION. EXAM: CT HEAD WITHOUT CONTRAST CT CERVICAL SPINE WITHOUT CONTRAST TECHNIQUE: Multidetector CT imaging of the head and cervical spine was performed following the standard protocol without intravenous contrast. Multiplanar CT image reconstructions of the cervical spine were also generated. COMPARISON:  07/20/2013 FINDINGS: CT HEAD FINDINGS There is no evidence of mass effect, midline shift, or extra-axial fluid collections. There is no evidence of a space-occupying lesion or intracranial hemorrhage. There is no evidence of a cortical-based area of  acute infarction. There is generalized cerebral atrophy. There is periventricular white matter low attenuation likely secondary to microangiopathy. The ventricles and sulci are appropriate for the patient's age. The basal cisterns are patent. Visualized portions of the orbits are unremarkable. There is near complete opacification of knee visualized left maxillary sinus. There is right ethmoid sinus mucosal thickening. Cerebrovascular atherosclerotic calcifications are noted. The osseous structures are unremarkable. CT CERVICAL SPINE FINDINGS The alignment is anatomic. The vertebral body heights are maintained. There is no acute fracture. There is no static listhesis. The prevertebral soft tissues are normal. The intraspinal soft tissues are not fully imaged on this examination due to poor soft tissue contrast, but there is no gross soft tissue abnormality. There is severe degenerative disc disease at C3-4, C4-5, C5-6 and C6-7. Bilateral uncovertebral degenerative changes and facet arthropathy with mild bilateral foraminal stenosis at C5-6. There is mild calcified right upper pleural plaque. There is bilateral carotid artery atherosclerosis. IMPRESSION: 1. No acute intracranial pathology. 2. No acute osseous injury of the cervical spine. Electronically Signed   By: Kathreen Devoid   On: 05/19/2015 14:13   I have personally reviewed and evaluated these images and lab results as part of my medical decision-making.   EKG Interpretation   Date/Time:  Thursday May 19 2015 13:30:37 EST Ventricular Rate:  65 PR Interval:  220 QRS Duration: 91 QT Interval:  434 QTC Calculation: 451 R Axis:   15 Text Interpretation:  Pacemaker spikes or artifacts Sinus rhythm Prolonged  PR interval Minimal ST depression, diffuse leads Confirmed by DELO  MD,  DOUGLAS ([id number]) on 05/19/2015 1:34:58 PM      MDM   Final diagnoses:  Fall at home, initial encounter  Laceration of head, initial encounter   82 year old with  mechanical fall. See history of present illness for details. On exam no acute deficits. Has left-sided weakness, 3 out of 5 strength. Patient says this is old from her prior stroke. No sign of infection or syncope. Has small laceration to the right temporal scalp. No active bleeding. T Updated, obtain CT head and neck as well as chest x-ray and pelvis x-ray.  EKG w/no sign of ischemia or arrhythmia.   No injuries on images. Lac repaired as above. DC home/back to facility.   Pt was seen under the supervision of Dr. Stark Jock.     Sherian Maroon, MD 05/19/15 [place], MD 05/19/15 [phone]

## 2015-05-19 NOTE — ED Notes (Signed)
Fall from standing position while using walker. Denies loc. C/o lac at r side or head. Bleeding controled. No know blood thinners. No neck or back pain. Previous stroke with residual left side  Weakness and parkinsons.

## 2015-05-19 NOTE — ED Notes (Signed)
Home with ptar 

## 2015-05-19 NOTE — ED Notes (Signed)
Report to Thurmond Butts at [facility] assisted [facility]

## 2015-11-04 ENCOUNTER — Ambulatory Visit (INDEPENDENT_AMBULATORY_CARE_PROVIDER_SITE_OTHER): Payer: Medicare Other | Admitting: Podiatry

## 2015-11-04 ENCOUNTER — Encounter: Payer: Self-pay | Admitting: Podiatry

## 2015-11-04 DIAGNOSIS — M79676 Pain in unspecified toe(s): Secondary | ICD-10-CM

## 2015-11-04 DIAGNOSIS — E1151 Type 2 diabetes mellitus with diabetic peripheral angiopathy without gangrene: Secondary | ICD-10-CM

## 2015-11-04 DIAGNOSIS — B351 Tinea unguium: Secondary | ICD-10-CM

## 2015-11-04 NOTE — Progress Notes (Signed)
   Subjective:    Patient ID: [name], female    DOB: 02-Nov-1928, 82 y.o.   MRN: [id number]  HPI this patient presents the office with chief complaint painful ingrowing toenail big toe left foot. Patient lives at a nursing facility. She states that her big toes on both feet are painful walking and wearing her shoes. She has a history of type 2 diabetes for which she takes insulin and gabapentin. She presents the office today for an evaluation and treatment of her feet    Review of Systems  All other systems reviewed and are negative.      Objective:   Physical Exam GENERAL APPEARANCE: Alert, conversant. Appropriately groomed. No acute distress.  VASCULAR: Pedal pulses are not   palpable at  [facility] and PT bilateral.  Capillary refill time is immediate to all digits,  Cold feet noted. NEUROLOGIC: sensation is normal to 5.07 monofilament at 5/5 sites bilateral.  Light touch is intact bilateral, Muscle strength normal.  MUSCULOSKELETAL: acceptable muscle strength, tone and stability bilateral.  Intrinsic muscluature intact bilateral.  Rectus appearance of foot and digits noted bilateral.   DERMATOLOGIC: skin color, texture, and turgor are within normal limits.  No preulcerative lesions or ulcers  are seen, no interdigital maceration noted.  No open lesions present.   No drainage noted.  NAILS  Thick disfigured discolored nails both great toes.         Assessment & Plan:  Onychomycosis  Diabetes with angiopathy.  IE  Debride nails.  RTC 3 months.

## 2017-07-19 ENCOUNTER — Emergency Department (HOSPITAL_COMMUNITY): Payer: Medicare Other

## 2017-07-19 ENCOUNTER — Other Ambulatory Visit: Payer: Self-pay

## 2017-07-19 ENCOUNTER — Inpatient Hospital Stay (HOSPITAL_COMMUNITY)
Admission: EM | Admit: 2017-07-19 | Discharge: 2017-07-23 | DRG: 683 | Disposition: A | Discharge status: Skilled Nursing Facility | Payer: Medicare Other | Attending: Internal Medicine | Admitting: Internal Medicine

## 2017-07-19 ENCOUNTER — Encounter (HOSPITAL_COMMUNITY): Payer: Self-pay | Admitting: Emergency Medicine

## 2017-07-19 ENCOUNTER — Emergency Department (HOSPITAL_COMMUNITY)
Admit: 2017-07-19 | Discharge: 2017-07-19 | Disposition: A | Discharge status: Home / Self Care | Payer: Medicare Other | Attending: Emergency Medicine | Admitting: Emergency Medicine

## 2017-07-19 DIAGNOSIS — Z923 Personal history of irradiation: Secondary | ICD-10-CM | POA: Diagnosis not present

## 2017-07-19 DIAGNOSIS — R609 Edema, unspecified: Secondary | ICD-10-CM

## 2017-07-19 DIAGNOSIS — R0602 Shortness of breath: Secondary | ICD-10-CM

## 2017-07-19 DIAGNOSIS — E8770 Fluid overload, unspecified: Secondary | ICD-10-CM | POA: Diagnosis present

## 2017-07-19 DIAGNOSIS — M109 Gout, unspecified: Secondary | ICD-10-CM | POA: Diagnosis present

## 2017-07-19 DIAGNOSIS — Z79899 Other long term (current) drug therapy: Secondary | ICD-10-CM

## 2017-07-19 DIAGNOSIS — F329 Major depressive disorder, single episode, unspecified: Secondary | ICD-10-CM | POA: Diagnosis present

## 2017-07-19 DIAGNOSIS — R531 Weakness: Secondary | ICD-10-CM | POA: Diagnosis not present

## 2017-07-19 DIAGNOSIS — E1122 Type 2 diabetes mellitus with diabetic chronic kidney disease: Secondary | ICD-10-CM | POA: Diagnosis present

## 2017-07-19 DIAGNOSIS — K219 Gastro-esophageal reflux disease without esophagitis: Secondary | ICD-10-CM | POA: Diagnosis present

## 2017-07-19 DIAGNOSIS — I129 Hypertensive chronic kidney disease with stage 1 through stage 4 chronic kidney disease, or unspecified chronic kidney disease: Secondary | ICD-10-CM | POA: Diagnosis present

## 2017-07-19 DIAGNOSIS — G20 Parkinson's disease: Secondary | ICD-10-CM | POA: Diagnosis present

## 2017-07-19 DIAGNOSIS — N184 Chronic kidney disease, stage 4 (severe): Secondary | ICD-10-CM | POA: Diagnosis present

## 2017-07-19 DIAGNOSIS — K8020 Calculus of gallbladder without cholecystitis without obstruction: Secondary | ICD-10-CM | POA: Diagnosis present

## 2017-07-19 DIAGNOSIS — I509 Heart failure, unspecified: Secondary | ICD-10-CM | POA: Diagnosis not present

## 2017-07-19 DIAGNOSIS — E785 Hyperlipidemia, unspecified: Secondary | ICD-10-CM | POA: Diagnosis present

## 2017-07-19 DIAGNOSIS — N19 Unspecified kidney failure: Secondary | ICD-10-CM

## 2017-07-19 DIAGNOSIS — Z87891 Personal history of nicotine dependence: Secondary | ICD-10-CM

## 2017-07-19 DIAGNOSIS — I10 Essential (primary) hypertension: Secondary | ICD-10-CM | POA: Diagnosis present

## 2017-07-19 DIAGNOSIS — Z794 Long term (current) use of insulin: Secondary | ICD-10-CM | POA: Diagnosis not present

## 2017-07-19 DIAGNOSIS — Z880 Allergy status to penicillin: Secondary | ICD-10-CM

## 2017-07-19 DIAGNOSIS — N179 Acute kidney failure, unspecified: Secondary | ICD-10-CM | POA: Diagnosis present

## 2017-07-19 DIAGNOSIS — I69354 Hemiplegia and hemiparesis following cerebral infarction affecting left non-dominant side: Secondary | ICD-10-CM

## 2017-07-19 DIAGNOSIS — Z8542 Personal history of malignant neoplasm of other parts of uterus: Secondary | ICD-10-CM

## 2017-07-19 DIAGNOSIS — E1165 Type 2 diabetes mellitus with hyperglycemia: Secondary | ICD-10-CM | POA: Diagnosis present

## 2017-07-19 LAB — CBC WITH DIFFERENTIAL/PLATELET
Basophils Absolute: 0 10*3/uL (ref 0.0–0.1)
Basophils Relative: 0 %
Eosinophils Absolute: 0.5 10*3/uL (ref 0.0–0.7)
Eosinophils Relative: 6 %
HCT: 27.6 % — ABNORMAL LOW (ref 36.0–46.0)
HEMOGLOBIN: 8.7 g/dL — AB (ref 12.0–15.0)
LYMPHS ABS: 1.9 10*3/uL (ref 0.7–4.0)
LYMPHS PCT: 21 %
MCH: 32.5 pg (ref 26.0–34.0)
MCHC: 31.5 g/dL (ref 30.0–36.0)
MCV: 103 fL — AB (ref 78.0–100.0)
MONO ABS: 0.8 10*3/uL (ref 0.1–1.0)
Monocytes Relative: 9 %
NEUTROS ABS: 5.6 10*3/uL (ref 1.7–7.7)
NEUTROS PCT: 64 %
Platelets: 230 10*3/uL (ref 150–400)
RBC: 2.68 MIL/uL — ABNORMAL LOW (ref 3.87–5.11)
RDW: 15.2 % (ref 11.5–15.5)
WBC: 8.8 10*3/uL (ref 4.0–10.5)

## 2017-07-19 LAB — GLUCOSE, CAPILLARY
Glucose-Capillary: 51 mg/dL — ABNORMAL LOW (ref 65–99)
Glucose-Capillary: 110 mg/dL — ABNORMAL HIGH (ref 65–99)
Glucose-Capillary: 159 mg/dL — ABNORMAL HIGH (ref 65–99)

## 2017-07-19 LAB — MAGNESIUM: Magnesium: 2.4 mg/dL (ref 1.7–2.4)

## 2017-07-19 LAB — COMPREHENSIVE METABOLIC PANEL
ALBUMIN: 3.4 g/dL — AB (ref 3.5–5.0)
ALK PHOS: 35 U/L — AB (ref 38–126)
ALT: 8 U/L — ABNORMAL LOW (ref 14–54)
ANION GAP: 10 (ref 5–15)
AST: 14 U/L — ABNORMAL LOW (ref 15–41)
BUN: 33 mg/dL — ABNORMAL HIGH (ref 6–20)
CALCIUM: 9.4 mg/dL (ref 8.9–10.3)
CHLORIDE: 100 mmol/L — AB (ref 101–111)
CO2: 27 mmol/L (ref 22–32)
Creatinine, Ser: 2.59 mg/dL — ABNORMAL HIGH (ref 0.44–1.00)
GFR calc non Af Amer: 16 mL/min — ABNORMAL LOW (ref >60)
GFR, EST AFRICAN AMERICAN: 18 mL/min — AB (ref >60)
GLUCOSE: 113 mg/dL — AB (ref 65–99)
Potassium: 4.7 mmol/L (ref 3.5–5.1)
SODIUM: 137 mmol/L (ref 135–145)
Total Bilirubin: 0.5 mg/dL (ref 0.3–1.2)
Total Protein: 6.6 g/dL (ref 6.5–8.1)

## 2017-07-19 LAB — URINALYSIS, ROUTINE W REFLEX MICROSCOPIC
Bilirubin Urine: NEGATIVE
GLUCOSE, UA: NEGATIVE mg/dL
Hgb urine dipstick: NEGATIVE
Ketones, ur: NEGATIVE mg/dL
LEUKOCYTES UA: NEGATIVE
Nitrite: NEGATIVE
PH: 7 (ref 5.0–8.0)
PROTEIN: NEGATIVE mg/dL
SPECIFIC GRAVITY, URINE: 1.004 — AB (ref 1.005–1.030)

## 2017-07-19 LAB — MRSA PCR SCREENING: MRSA by PCR: NEGATIVE

## 2017-07-19 LAB — PHOSPHORUS: Phosphorus: 4.5 mg/dL (ref 2.5–4.6)

## 2017-07-19 LAB — TROPONIN I: Troponin I: <0.03 ng/mL (ref <0.03)

## 2017-07-19 LAB — BRAIN NATRIURETIC PEPTIDE: B Natriuretic Peptide: 282.5 pg/mL — ABNORMAL HIGH (ref 0.0–100.0)

## 2017-07-19 MED ORDER — BISOPROLOL FUMARATE 5 MG PO TABS
10.0000 mg | ORAL_TABLET | Freq: Every day | ORAL | Status: DC
  Administered 2017-07-20 – 2017-07-23 (×4): 10 mg via ORAL
  Filled 2017-07-19 (×4): qty 2

## 2017-07-19 MED ORDER — INSULIN GLARGINE 100 UNIT/ML ~~LOC~~ SOLN
20.0000 [IU] | Freq: Two times a day (BID) | SUBCUTANEOUS | Status: DC
  Administered 2017-07-19 – 2017-07-23 (×8): 20 [IU] via SUBCUTANEOUS
  Filled 2017-07-19 (×9): qty 0.2

## 2017-07-19 MED ORDER — SODIUM CHLORIDE 0.9 % IV BOLUS (SEPSIS)
500.0000 mL | Freq: Once | INTRAVENOUS | Status: AC
  Administered 2017-07-19: 500 mL via INTRAVENOUS

## 2017-07-19 MED ORDER — ALLOPURINOL 300 MG PO TABS
150.0000 mg | ORAL_TABLET | Freq: Every day | ORAL | Status: DC
  Administered 2017-07-19 – 2017-07-22 (×4): 150 mg via ORAL
  Filled 2017-07-19 (×4): qty 1

## 2017-07-19 MED ORDER — PANTOPRAZOLE SODIUM 20 MG PO TBEC
20.0000 mg | DELAYED_RELEASE_TABLET | Freq: Every day | ORAL | Status: DC
  Administered 2017-07-20 – 2017-07-23 (×4): 20 mg via ORAL
  Filled 2017-07-19 (×4): qty 1

## 2017-07-19 MED ORDER — INSULIN ASPART 100 UNIT/ML ~~LOC~~ SOLN: 0.0000 [IU] | Freq: Every day | SUBCUTANEOUS | Status: DC

## 2017-07-19 MED ORDER — ROPINIROLE HCL 1 MG PO TABS
1.0000 mg | ORAL_TABLET | Freq: Two times a day (BID) | ORAL | Status: DC
  Administered 2017-07-19 – 2017-07-23 (×8): 1 mg via ORAL
  Filled 2017-07-19 (×9): qty 1

## 2017-07-19 MED ORDER — FLUTICASONE PROPIONATE 50 MCG/ACT NA SUSP
1.0000 | Freq: Every day | NASAL | Status: DC
  Administered 2017-07-20 – 2017-07-23 (×4): 1 via NASAL
  Filled 2017-07-19: qty 16

## 2017-07-19 MED ORDER — PROMETHAZINE HCL 25 MG RE SUPP: 25.0000 mg | Freq: Four times a day (QID) | RECTAL | Status: DC | PRN

## 2017-07-19 MED ORDER — OLOPATADINE HCL 0.1 % OP SOLN
1.0000 [drp] | Freq: Two times a day (BID) | OPHTHALMIC | Status: DC
  Administered 2017-07-19 – 2017-07-23 (×8): 1 [drp] via OPHTHALMIC
  Filled 2017-07-19: qty 5

## 2017-07-19 MED ORDER — NAPHAZOLINE-GLYCERIN 0.012-0.2 % OP SOLN: 1.0000 [drp] | Freq: Four times a day (QID) | OPHTHALMIC | Status: DC | PRN

## 2017-07-19 MED ORDER — FERROUS SULFATE 325 (65 FE) MG PO TABS
325.0000 mg | ORAL_TABLET | Freq: Every day | ORAL | Status: DC
  Administered 2017-07-20 – 2017-07-23 (×4): 325 mg via ORAL
  Filled 2017-07-19 (×4): qty 1

## 2017-07-19 MED ORDER — FUROSEMIDE 10 MG/ML IJ SOLN
80.0000 mg | Freq: Two times a day (BID) | INTRAMUSCULAR | Status: AC
  Administered 2017-07-19 – 2017-07-20 (×2): 80 mg via INTRAVENOUS
  Filled 2017-07-19 (×2): qty 8

## 2017-07-19 MED ORDER — FLUOXETINE HCL 20 MG PO CAPS
20.0000 mg | ORAL_CAPSULE | Freq: Every day | ORAL | Status: DC
  Administered 2017-07-20 – 2017-07-23 (×4): 20 mg via ORAL
  Filled 2017-07-19 (×5): qty 1

## 2017-07-19 MED ORDER — SALINE SPRAY 0.65 % NA SOLN: 1.0000 | Freq: Four times a day (QID) | NASAL | Status: DC | PRN

## 2017-07-19 MED ORDER — IOPAMIDOL (ISOVUE-300) INJECTION 61%
INTRAVENOUS | Status: AC
Start: 2017-07-19 — End: 2017-07-19
  Filled 2017-07-19: qty 30

## 2017-07-19 MED ORDER — HYDRALAZINE HCL 25 MG PO TABS
25.0000 mg | ORAL_TABLET | Freq: Four times a day (QID) | ORAL | Status: DC
  Administered 2017-07-19 – 2017-07-23 (×16): 25 mg via ORAL
  Filled 2017-07-19 (×17): qty 1

## 2017-07-19 MED ORDER — DIVALPROEX SODIUM 125 MG PO CSDR
375.0000 mg | DELAYED_RELEASE_CAPSULE | Freq: Every day | ORAL | Status: DC
  Administered 2017-07-19 – 2017-07-22 (×4): 375 mg via ORAL
  Filled 2017-07-19 (×4): qty 3

## 2017-07-19 MED ORDER — VITAMIN B-12 1000 MCG PO TABS
1000.0000 ug | ORAL_TABLET | Freq: Every day | ORAL | Status: DC
  Administered 2017-07-20 – 2017-07-23 (×4): 1000 ug via ORAL
  Filled 2017-07-19 (×4): qty 1

## 2017-07-19 MED ORDER — SIMVASTATIN 10 MG PO TABS
20.0000 mg | ORAL_TABLET | Freq: Every day | ORAL | Status: DC
  Administered 2017-07-19 – 2017-07-22 (×4): 20 mg via ORAL
  Filled 2017-07-19 (×4): qty 2

## 2017-07-19 MED ORDER — GABAPENTIN 300 MG PO CAPS
300.0000 mg | ORAL_CAPSULE | Freq: Every day | ORAL | Status: DC
  Administered 2017-07-19 – 2017-07-22 (×4): 300 mg via ORAL
  Filled 2017-07-19 (×4): qty 1

## 2017-07-19 MED ORDER — HEPARIN SODIUM (PORCINE) 5000 UNIT/ML IJ SOLN
5000.0000 [IU] | Freq: Three times a day (TID) | INTRAMUSCULAR | Status: DC
  Administered 2017-07-19 – 2017-07-23 (×12): 5000 [IU] via SUBCUTANEOUS
  Filled 2017-07-19 (×12): qty 1

## 2017-07-19 MED ORDER — AMLODIPINE BESYLATE 10 MG PO TABS
10.0000 mg | ORAL_TABLET | Freq: Every day | ORAL | Status: DC
  Administered 2017-07-19 – 2017-07-20 (×2): 10 mg via ORAL
  Filled 2017-07-19 (×2): qty 1

## 2017-07-19 MED ORDER — MAGNESIUM OXIDE 400 (241.3 MG) MG PO TABS
400.0000 mg | ORAL_TABLET | Freq: Every day | ORAL | Status: DC
  Administered 2017-07-20 – 2017-07-23 (×4): 400 mg via ORAL
  Filled 2017-07-19 (×4): qty 1

## 2017-07-19 MED ORDER — INSULIN ASPART 100 UNIT/ML ~~LOC~~ SOLN
0.0000 [IU] | Freq: Three times a day (TID) | SUBCUTANEOUS | Status: DC
  Administered 2017-07-20: 1 [IU] via SUBCUTANEOUS
  Administered 2017-07-20: 2 [IU] via SUBCUTANEOUS
  Administered 2017-07-21: 3 [IU] via SUBCUTANEOUS
  Administered 2017-07-21 – 2017-07-23 (×3): 1 [IU] via SUBCUTANEOUS
  Administered 2017-07-23: 2 [IU] via SUBCUTANEOUS

## 2017-07-19 MED ORDER — VITAMIN D3 25 MCG (1000 UNIT) PO TABS
8000.0000 [IU] | ORAL_TABLET | Freq: Every day | ORAL | Status: DC
  Administered 2017-07-20 – 2017-07-23 (×4): 8000 [IU] via ORAL
  Filled 2017-07-19 (×4): qty 8

## 2017-07-19 NOTE — Progress Notes (Signed)
Hypoglycemic Event  CBG: 51  Treatment: Dinner  Symptoms: None  Follow-up CBG: CBG Result:110  Possible Reasons for Event: Hadn't eaten all day.

## 2017-07-19 NOTE — ED Notes (Signed)
Bed: AW89 Expected date:  Expected time:  Means of arrival:  Comments: EMS-SOB/weak

## 2017-07-19 NOTE — H&P (Signed)
History and Physical  [name] OZH:[id number] DOB: 1928-08-02 DOA: 07/19/2017  Referring physician: ER physician PCP: Josetta Huddle, MD  Outpatient Specialists:    Patient coming from: Assisted living facility.  Chief Complaint: Shortness of breath  HPI:  Patient is an 84 year old Caucasian female, very poor historian, assisted living facility resident, with a past medical history significant for CVA, chronic kidney disease, hypertension, pneumonia, diabetes mellitus, gout, falls, GI bleed and history of cancer status post irradiation.  The patient could only tell me that she was intermittently short of breath, with O2 sat in the 80s, and has had swelling involving the legs and the hands.  Patient also reported about gaseous distention of the abdomen.  His CT scan of the abdomen and pelvis is nonrevealing.  It only revealed cholelithiasis.  Chest x-ray revealed bilateral mild pleural effusion.  Patient denies headache, no neck pain, no chest pain, no fever or chills, no cough, no urinary symptoms.  The patient is also said to be progressively weak.  Workup done revealed serum creatinine of 2.59, up from 0.86 about 2-3 years ago.  There is no history of NSAID use.  ED Course: CT of the abdomen done in the ER revealed cholelithiasis.  UA is nonrevealing.  BMP reveals sodium of 137, potassium of 4.7, CO2 27, BUN of 33 with creatinine of 2.59 blood sugar of 113.  Cardiac BNP is 282.5.  Troponin is less than 0.03.  CBC reveals WBC of 8.8, hemoglobin of 8.7, hematocrit of 27.6, MCV of 103 with platelet count of 230 Pertinent labs: As above EKG: Independently reviewed.  Imaging: independently reviewed.   Review of Systems:  Very poor historian.  Managed to review 10 systems. Negative for fever, visual changes, sore throat, rash, new muscle aches, chest pain, dysuria, bleeding, n/v/abdominal pain.  Past Medical History:  Diagnosis Date  . Cancer ([place]) 07/04/10   endometrial  . Diabetes mellitus    . Falling episodes   . Gout   . Hx of radiation therapy 09/13/10- 10/19/10, 6/12, 6/19, 11/14/10   pelvis, external beam tehn brachytherapy  . Hypertension   . Pneumonia   . Renal disorder   . Stroke [facility])    L sided deficits w/slurred speech    Past Surgical History:  Procedure Laterality Date  . ABDOMINAL HYSTERECTOMY       reports that she has quit smoking. Her smoking use included cigarettes. she has never used smokeless tobacco. She reports that she does not drink alcohol or use drugs.  Allergies  Allergen Reactions  . Penicillins Other (See Comments)    unknown    Family History  Problem Relation Age of Onset  . Hypertension Mother   . Hypertension Father      Prior to Admission medications   Medication Sig Start Date End Date Taking? Authorizing Provider  acetaminophen (TYLENOL) 325 MG tablet Take 650 mg by mouth every 8 (eight) hours as needed for moderate pain.   Yes [provider]  allopurinol (ZYLOPRIM) 300 MG tablet Take 150 mg by mouth at bedtime.   Yes [provider]  alum & mag hydroxide-simeth (MAALOX/MYLANTA) 200-200-20 MG/5ML suspension Take 30 mLs by mouth every 4 (four) hours as needed for indigestion or heartburn.   Yes [provider]  amLODipine (NORVASC) 10 MG tablet Take 10 mg by mouth daily. 07/11/17  Yes [provider]  bisoprolol (ZEBETA) 10 MG tablet Take 1 tablet (10 mg total) by mouth daily. 07/23/13  Yes Josetta Huddle, MD  Cholecalciferol (HM VITAMIN D3) 4000 UNITS CAPS Take 8,000 Units by mouth daily.   Yes [provider]  Cyanocobalamin (VITAMIN B 12 PO) Take 1 tablet by mouth daily.    Yes [provider]  divalproex (DEPAKOTE SPRINKLE) 125 MG capsule Take 375 mg by mouth at bedtime. 07/14/17  Yes [provider]  ferrous sulfate 325 (65 FE) MG tablet Take 325 mg by mouth daily with breakfast.   Yes [provider]  FLUoxetine (PROZAC) 20 MG capsule Take 20 mg by mouth  every morning. 07/09/17  Yes [provider]  fluticasone (FLONASE) 50 MCG/ACT nasal spray Place 1 spray into both nostrils daily.   Yes [provider]  furosemide (LASIX) 20 MG tablet Take 20 mg by mouth.   Yes [provider]  gabapentin (NEURONTIN) 400 MG capsule Take 400 mg by mouth at bedtime.   Yes [provider]  hydrALAZINE (APRESOLINE) 25 MG tablet Take 25 mg by mouth 4 (four) times daily.    Yes [provider]  insulin aspart (NOVOLOG FLEXPEN) 100 UNIT/ML FlexPen Inject 100 Units into the skin 2 (two) times daily.   Yes [provider]  Insulin Glargine (LANTUS SOLOSTAR) 100 UNIT/ML Solostar Pen Inject 40 Units into the skin every morning. Patient taking differently: Inject 80 Units into the skin at bedtime.  07/23/13  Yes Josetta Huddle, MD  linagliptin (TRADJENTA) 5 MG TABS tablet Take 5 mg by mouth daily.   Yes [provider]  losartan (COZAAR) 100 MG tablet Take 1 tablet (100 mg total) by mouth daily. 07/23/13  Yes Josetta Huddle, MD  magnesium oxide (MAG-OX) 400 MG tablet Take 400 mg by mouth daily.   Yes [provider]  Olopatadine HCl 0.2 % SOLN Place 1 drop into both eyes daily. 07/08/17  Yes [provider]  pantoprazole (PROTONIX) 20 MG tablet Take 20 mg by mouth daily. 07/04/17  Yes [provider]  promethazine (PHENERGAN) 25 MG suppository Place 25 mg rectally every 6 (six) hours as needed for nausea.   Yes [provider]  promethazine (PHENERGAN) 25 MG tablet Take 25 mg by mouth every 6 (six) hours as needed for nausea.   Yes [provider]  rOPINIRole (REQUIP) 1 MG tablet Take 1 mg by mouth 2 (two) times daily.   Yes [provider]  simethicone (MYLICON) 80 MG chewable tablet Chew 80 mg by mouth 3 (three) times daily as needed for flatulence (and bloating).   Yes [provider]  simvastatin (ZOCOR) 20 MG tablet Take 20 mg by mouth at bedtime. 07/18/17   Yes [provider]  sodium chloride (DEEP SEA NASAL SPRAY) 0.65 % nasal spray Place 1 spray into the nose 4 (four) times daily as needed for congestion.    Yes [provider]  tetrahydrozoline 0.05 % ophthalmic solution Place 2 drops into both eyes 2 (two) times daily.   Yes [provider]  traMADol (ULTRAM) 50 MG tablet Take 50 mg by mouth 2 (two) times daily. 1/2 tablet twice daily as needed for pain, and 1 tablet at bedtime   Yes [provider]  traZODone (DESYREL) 50 MG tablet Take 50 mg by mouth at bedtime. 06/25/17  Yes [provider]    Physical Exam: Vitals:   07/19/17 1230 07/19/17 1330 07/19/17 1430 07/19/17 1630  BP: (!) 132/43 (!) 164/51 (!) 154/45 (!) 157/47  Pulse: (!) 54 60 60 (!) 58  Resp: 15 16 16 16   Temp:  TempSrc:      SpO2: 100% 100% 98% 97%     Constitutional:  . Appears calm and comfortable Eyes:  . No pallor. No jaundice.  ENMT:  . external ears, nose appear normal.  Dry tongue Neck:  . Neck is supple. No JVD Respiratory:  . Decreased air entry globally.   Cardiovascular:  . S1S2 . Minimal lower leg edema.    Abdomen:  Gaseous distention of the abdomen.  Organs are not palpable.  Neurologic:  . Awake and alert. . Moves all limbs.  Wt Readings from Last 3 Encounters:  05/19/15 54.4 kg (120 lb)  07/31/13 54.1 kg (119 lb 4.3 oz)  07/20/13 57.5 kg (126 lb 12.2 oz)    I have personally reviewed following labs and imaging studies  Labs on Admission:  CBC: Recent Labs  Lab 07/19/17 1012  WBC 8.8  NEUTROABS 5.6  HGB 8.7*  HCT 27.6*  MCV 103.0*  PLT 654   Basic Metabolic Panel: Recent Labs  Lab 07/19/17 1012  NA 137  K 4.7  CL 100*  CO2 27  GLUCOSE 113*  BUN 33*  CREATININE 2.59*  CALCIUM 9.4   Liver Function Tests: Recent Labs  Lab 07/19/17 1012  AST 14*  ALT 8*  ALKPHOS 35*  BILITOT 0.5  PROT 6.6  ALBUMIN 3.4*   No results for input(s): LIPASE, AMYLASE in the last  168 hours. No results for input(s): AMMONIA in the last 168 hours. Coagulation Profile: No results for input(s): INR, PROTIME in the last 168 hours. Cardiac Enzymes: Recent Labs  Lab 07/19/17 1012  TROPONINI <0.03   BNP (last 3 results) No results for input(s): PROBNP in the last 8760 hours. HbA1C: No results for input(s): HGBA1C in the last 72 hours. CBG: No results for input(s): GLUCAP in the last 168 hours. Lipid Profile: No results for input(s): CHOL, HDL, LDLCALC, TRIG, CHOLHDL, LDLDIRECT in the last 72 hours. Thyroid Function Tests: No results for input(s): TSH, T4TOTAL, FREET4, T3FREE, THYROIDAB in the last 72 hours. Anemia Panel: No results for input(s): VITAMINB12, FOLATE, FERRITIN, TIBC, IRON, RETICCTPCT in the last 72 hours. Urine analysis:    Component Value Date/Time   COLORURINE YELLOW 07/19/2017 1430   APPEARANCEUR CLEAR 07/19/2017 1430   LABSPEC 1.004 (L) 07/19/2017 1430   PHURINE 7.0 07/19/2017 1430   GLUCOSEU NEGATIVE 07/19/2017 1430   HGBUR NEGATIVE 07/19/2017 1430   BILIRUBINUR NEGATIVE 07/19/2017 1430   KETONESUR NEGATIVE 07/19/2017 1430   PROTEINUR NEGATIVE 07/19/2017 1430   UROBILINOGEN 0.2 07/29/2013 0926   NITRITE NEGATIVE 07/19/2017 1430   LEUKOCYTESUR NEGATIVE 07/19/2017 1430   Sepsis Labs: @LABRCNTIP (procalcitonin:4,lacticidven:4) )No results found for this or any previous visit (from the past 240 hour(s)).    Radiological Exams on Admission: Ct Abdomen Pelvis Wo Contrast  Result Date: 07/19/2017 CLINICAL DATA:  Abdominal pain and distension EXAM: CT ABDOMEN AND PELVIS WITHOUT CONTRAST TECHNIQUE: Multidetector CT imaging of the abdomen and pelvis was performed following the standard protocol without IV contrast. COMPARISON:  Plain film from earlier in the same day. FINDINGS: Lower chest: Small bilateral pleural effusions are noted. Bibasilar atelectatic changes are seen as well. Hepatobiliary: Liver is within normal limits. The gallbladder is  well distended and demonstrates multiple small dependent gallstones. Pancreas: Unremarkable. No pancreatic ductal dilatation or surrounding inflammatory changes. Spleen: Normal in size without focal abnormality. Adrenals/Urinary Tract: Adrenal glands are within normal limits. Scattered renal vascular calcifications are seen. No renal calculi are noted. No obstructive changes are seen. The bladder  is well distended. Stomach/Bowel: The appendix is not well visualized although no inflammatory changes are identified. Mild retained fecal material is seen. No obstructive or inflammatory changes are noted. Vascular/Lymphatic: Aortic atherosclerosis. No enlarged abdominal or pelvic lymph nodes. Reproductive: Status post hysterectomy. No adnexal masses. Other: No abdominal wall hernia or abnormality. No abdominopelvic ascites. Musculoskeletal: Degenerative changes of lumbar spine are noted. Anterolisthesis of L4 on L5 is noted. IMPRESSION: Bilateral pleural effusions and basilar atelectatic changes. Cholelithiasis without complicating factors. No acute abnormality noted. Electronically Signed   By: Inez Catalina M.D.   On: 07/19/2017 13:26   Dg Chest 2 View  Result Date: 07/19/2017 CLINICAL DATA:  Pt states SOB, weakness, swelling of feet and legs increasing x 3 days - hx endometrial cancer 2012 - diabetic - hx hypertension -former smoker EXAM: CHEST - 2 VIEW COMPARISON:  Chest x-ray dated 05/19/2015 FINDINGS: Heart size and mediastinal contours are stable. Atherosclerotic changes noted at the aortic arch. Blunting at the costophrenic angles bilaterally. Lungs otherwise stable. No pneumothorax seen. No acute or suspicious osseous finding. IMPRESSION: 1. Small bilateral pleural effusions, new compared to previous chest x-ray of 05/19/2015. 2. No evidence of pneumonia or pulmonary edema. 3. Aortic atherosclerosis. Electronically Signed   By: Franki Cabot M.D.   On: 07/19/2017 10:47    EKG: Independently reviewed.    Active Problems:   AKI (acute kidney injury) ([place])   Assessment/Plan Acute kidney injury: -Last serum creatinine done was about 3 years ago, and was 0.86. -I have not visualized any other chemistry within the last 3 years.  Possibly, patient could have progressive chronic kidney disease versus acute kidney disease on chronic kidney disease as well. -We will proceed with AK I workup. -We will proceed with renal ultrasound. -We will proceed with urine studies. -We will avoid nephrotoxins. -We will dose all medications assuming the GFR of less than 10 mls per minute. -We will have a low threshold to consult the nephrology team. -Further management will depend on hospital course.    Weakness and fatigue: -We will get PT OT consult. -We will continue to assess.  Shortness of breath/edema: -We will get an echocardiogram. -With diuresis the patient was monitoring the renal function and electrolytes.  Hypertension: -We will we will continue to optimize.  Further management will depend on hospital course.  DVT prophylaxis: Subcutaneous heparin. Code Status: Full Family Communication: None available Disposition Plan: Will depend on hospital course Consults called: Low threshold to consult nephrology and/or cardiology  Admission status: Inpatient  Time spent: 60 minutes  Dana Allan, MD  [facility] Pager #: [phone] 7PM-7AM contact night coverage as above   07/19/2017, 4:56 PM

## 2017-07-19 NOTE — ED Notes (Signed)
[place]

## 2017-07-19 NOTE — ED Provider Notes (Signed)
[place] DEPT Provider Note   CSN: [id number] Arrival date & time: 07/19/17  0174     History   Chief Complaint Chief Complaint  Patient presents with  . Weakness    HPI [name] is a 84 y.o. female.  The history is provided by the patient and the EMS personnel. No language interpreter was used.  Weakness     [name] is a 84 y.o. female who presents to the Emergency Department complaining of weakness/sob.  She presents to the emergency department via EMS for evaluation of shortness of breath and weakness.  History is provided by the patient and EMS.  She is a resident of [facility] assisted [facility].  At baseline she ambulates with a walker due to left hemiparesis and is on 2 L nasal cannula oxygen.  Over the last 2 weeks she has had progressive shortness of breath with increased fatigue.  She has significant dyspnea on exertion and is too short of breath to use her wheelchair for ambulation.  She has been sleeping more and laying in her bed.  She does endorse increased pain to bilateral lower extremities, left greater than right.  She also has bilateral lower extremity edema that is slightly improved but has been progressive over the last few weeks.  She has associated abdominal bloating and swelling that is also improved over the last 24 hours after a large bowel movement this morning.  She endorses feeling very gassy and passing flatus frequently.  No fevers, diaphoresis, chest pain.  She has a nonproductive cough.  Past Medical History:  Diagnosis Date  . Cancer ([place]) 07/04/10   endometrial  . Diabetes mellitus   . Falling episodes   . Gout   . Hx of radiation therapy 09/13/10- 10/19/10, 6/12, 6/19, 11/14/10   pelvis, external beam tehn brachytherapy  . Hypertension   . Pneumonia   . Renal disorder   . Stroke [facility])    L sided deficits w/slurred speech    Patient Active Problem List   Diagnosis Date Noted  . AKI (acute kidney injury)  ([place]) 07/19/2017  . Acute respiratory failure ([place]) 07/30/2013  . GI bleed 07/29/2013  . Anemia associated with acute blood loss 07/29/2013  . Diarrhea 07/21/2013  . Renal failure, acute on chronic ([facility]) 07/20/2013  . Dehydration with hyponatremia 07/20/2013  . Anemia 07/20/2013  . Acute encephalopathy 07/20/2013  . Diabetes type 2, uncontrolled ([place]) 07/20/2013  . Stroke ([place])   . Hypertension   . Hx of radiation therapy   . Cancer ([place])   . Malignant neoplasm of corpus uteri, except isthmus ([place]) 06/11/2011    Past Surgical History:  Procedure Laterality Date  . ABDOMINAL HYSTERECTOMY      OB History    Gravida Para Term Preterm AB Living   2 2           SAB TAB Ectopic Multiple Live Births                   Home Medications    Prior to Admission medications   Medication Sig Start Date End Date Taking? Authorizing Provider  acetaminophen (TYLENOL) 325 MG tablet Take 650 mg by mouth every 8 (eight) hours as needed for moderate pain.   Yes [provider]  allopurinol (ZYLOPRIM) 300 MG tablet Take 150 mg by mouth at bedtime.   Yes [provider]  alum & mag hydroxide-simeth (MAALOX/MYLANTA) 200-200-20 MG/5ML suspension Take 30 mLs by mouth every  4 (four) hours as needed for indigestion or heartburn.   Yes [provider]  amLODipine (NORVASC) 10 MG tablet Take 10 mg by mouth daily. 07/11/17  Yes [provider]  bisoprolol (ZEBETA) 10 MG tablet Take 1 tablet (10 mg total) by mouth daily. 07/23/13  Yes Josetta Huddle, MD  Cholecalciferol (HM VITAMIN D3) 4000 UNITS CAPS Take 8,000 Units by mouth daily.   Yes [provider]  Cyanocobalamin (VITAMIN B 12 PO) Take 1 tablet by mouth daily.    Yes [provider]  divalproex (DEPAKOTE SPRINKLE) 125 MG capsule Take 375 mg by mouth at bedtime. 07/14/17  Yes [provider]  ferrous sulfate 325 (65 FE) MG tablet Take 325 mg by mouth daily with breakfast.   Yes [provider]  FLUoxetine (PROZAC) 20 MG capsule Take 20 mg by mouth every morning. 07/09/17  Yes [provider]  fluticasone (FLONASE) 50 MCG/ACT nasal spray Place 1 spray into both nostrils daily.   Yes [provider]  furosemide (LASIX) 20 MG tablet Take 20 mg by mouth.   Yes [provider]  gabapentin (NEURONTIN) 400 MG capsule Take 400 mg by mouth at bedtime.   Yes [provider]  hydrALAZINE (APRESOLINE) 25 MG tablet Take 25 mg by mouth 4 (four) times daily.    Yes [provider]  insulin aspart (NOVOLOG FLEXPEN) 100 UNIT/ML FlexPen Inject 100 Units into the skin 2 (two) times daily.   Yes [provider]  Insulin Glargine (LANTUS SOLOSTAR) 100 UNIT/ML Solostar Pen Inject 40 Units into the skin every morning. Patient taking differently: Inject 80 Units into the skin at bedtime.  07/23/13  Yes Josetta Huddle, MD  linagliptin (TRADJENTA) 5 MG TABS tablet Take 5 mg by mouth daily.   Yes [provider]  losartan (COZAAR) 100 MG tablet Take 1 tablet (100 mg total) by mouth daily. 07/23/13  Yes Josetta Huddle, MD  magnesium oxide (MAG-OX) 400 MG tablet Take 400 mg by mouth daily.   Yes [provider]  Olopatadine HCl 0.2 % SOLN Place 1 drop into both eyes daily. 07/08/17  Yes [provider]  pantoprazole (PROTONIX) 20 MG tablet Take 20 mg by mouth daily. 07/04/17  Yes [provider]  promethazine (PHENERGAN) 25 MG suppository Place 25 mg rectally every 6 (six) hours as needed for nausea.   Yes [provider]  promethazine (PHENERGAN) 25 MG tablet Take 25 mg by mouth every 6 (six) hours as needed for nausea.   Yes [provider]  rOPINIRole (REQUIP) 1 MG tablet Take 1 mg by mouth 2 (two) times daily.   Yes [provider]  simethicone (MYLICON) 80 MG chewable tablet Chew 80 mg by mouth 3 (three) times daily as needed for flatulence (and bloating).   Yes [provider]    simvastatin (ZOCOR) 20 MG tablet Take 20 mg by mouth at bedtime. 07/18/17  Yes [provider]  sodium chloride (DEEP SEA NASAL SPRAY) 0.65 % nasal spray Place 1 spray into the nose 4 (four) times daily as needed for congestion.    Yes [provider]  tetrahydrozoline 0.05 % ophthalmic solution Place 2 drops into both eyes 2 (two) times daily.   Yes [provider]  traMADol (ULTRAM) 50 MG tablet Take 50 mg by mouth 2 (two) times daily. 1/2 tablet twice daily as needed for pain, and 1 tablet at bedtime   Yes [provider]  traZODone (DESYREL) 50 MG  tablet Take 50 mg by mouth at bedtime. 06/25/17  Yes [provider]    Family History Family History  Problem Relation Age of Onset  . Hypertension Mother   . Hypertension Father     Social History Social History   Tobacco Use  . Smoking status: Former Smoker    Types: Cigarettes  . Smokeless tobacco: Never Used  . Tobacco comment: quit many yrs ago  Substance Use Topics  . Alcohol use: No  . Drug use: No     Allergies   Penicillins   Review of Systems Review of Systems  Neurological: Positive for weakness.  All other systems reviewed and are negative.    Physical Exam Updated Vital Signs BP (!) 157/47   Pulse (!) 58   Temp 97.7 F (36.5 C) (Oral)   Resp 16   SpO2 97%   Physical Exam  Constitutional: She is oriented to person, place, and time. She appears well-developed and well-nourished.  HENT:  Head: Normocephalic and atraumatic.  Cardiovascular: Normal rate and regular rhythm.  No murmur heard. Pulmonary/Chest: Effort normal. No respiratory distress.  Occasional end expiratory wheezes.  Abdominal: Soft. There is no tenderness. There is no rebound and no guarding.  Musculoskeletal:  Mild tenderness to the left calf.  There is trace edema to the right lower extremity.  1+ pitting edema to the left upper extremity.  Neurological: She is alert and oriented to person,  place, and time.  Left hemiparesis.  Skin: Skin is warm and dry.  Psychiatric: She has a normal mood and affect. Her behavior is normal.  Nursing note and vitals reviewed.    ED Treatments / Results  Labs (all labs ordered are listed, but only abnormal results are displayed) Labs Reviewed  COMPREHENSIVE METABOLIC PANEL - Abnormal; Notable for the following components:      Result Value   Chloride 100 (*)    Glucose, Bld 113 (*)    BUN 33 (*)    Creatinine, Ser 2.59 (*)    Albumin 3.4 (*)    AST 14 (*)    ALT 8 (*)    Alkaline Phosphatase 35 (*)    GFR calc non Af Amer 16 (*)    GFR calc Af Amer 18 (*)    All other components within normal limits  CBC WITH DIFFERENTIAL/PLATELET - Abnormal; Notable for the following components:   RBC 2.68 (*)    Hemoglobin 8.7 (*)    HCT 27.6 (*)    MCV 103.0 (*)    All other components within normal limits  URINALYSIS, ROUTINE W REFLEX MICROSCOPIC - Abnormal; Notable for the following components:   Specific Gravity, Urine 1.004 (*)    All other components within normal limits  BRAIN NATRIURETIC PEPTIDE - Abnormal; Notable for the following components:   B Natriuretic Peptide 282.5 (*)    All other components within normal limits  TROPONIN I    EKG  EKG Interpretation  Date/Time:  Friday July 19 2017 10:02:28 EST Ventricular Rate:  58 PR Interval:    QRS Duration: 118 QT Interval:  471 QTC Calculation: 463 R Axis:   -43 Text Interpretation:  Sinus rhythm Incomplete right bundle branch block Low voltage, precordial leads Baseline wander Confirmed by Quintella Reichert [phone]) on 07/19/2017 10:26:20 AM       Radiology Ct Abdomen Pelvis Wo Contrast  Result Date: 07/19/2017 CLINICAL DATA:  Abdominal pain and distension EXAM: CT ABDOMEN AND PELVIS WITHOUT CONTRAST TECHNIQUE: Multidetector CT imaging of  the abdomen and pelvis was performed following the standard protocol without IV contrast. COMPARISON:  Plain film from earlier in the  same day. FINDINGS: Lower chest: Small bilateral pleural effusions are noted. Bibasilar atelectatic changes are seen as well. Hepatobiliary: Liver is within normal limits. The gallbladder is well distended and demonstrates multiple small dependent gallstones. Pancreas: Unremarkable. No pancreatic ductal dilatation or surrounding inflammatory changes. Spleen: Normal in size without focal abnormality. Adrenals/Urinary Tract: Adrenal glands are within normal limits. Scattered renal vascular calcifications are seen. No renal calculi are noted. No obstructive changes are seen. The bladder is well distended. Stomach/Bowel: The appendix is not well visualized although no inflammatory changes are identified. Mild retained fecal material is seen. No obstructive or inflammatory changes are noted. Vascular/Lymphatic: Aortic atherosclerosis. No enlarged abdominal or pelvic lymph nodes. Reproductive: Status post hysterectomy. No adnexal masses. Other: No abdominal wall hernia or abnormality. No abdominopelvic ascites. Musculoskeletal: Degenerative changes of lumbar spine are noted. Anterolisthesis of L4 on L5 is noted. IMPRESSION: Bilateral pleural effusions and basilar atelectatic changes. Cholelithiasis without complicating factors. No acute abnormality noted. Electronically Signed   By: Inez Catalina M.D.   On: 07/19/2017 13:26   Dg Chest 2 View  Result Date: 07/19/2017 CLINICAL DATA:  Pt states SOB, weakness, swelling of feet and legs increasing x 3 days - hx endometrial cancer 2012 - diabetic - hx hypertension -former smoker EXAM: CHEST - 2 VIEW COMPARISON:  Chest x-ray dated 05/19/2015 FINDINGS: Heart size and mediastinal contours are stable. Atherosclerotic changes noted at the aortic arch. Blunting at the costophrenic angles bilaterally. Lungs otherwise stable. No pneumothorax seen. No acute or suspicious osseous finding. IMPRESSION: 1. Small bilateral pleural effusions, new compared to previous chest x-ray of  05/19/2015. 2. No evidence of pneumonia or pulmonary edema. 3. Aortic atherosclerosis. Electronically Signed   By: Franki Cabot M.D.   On: 07/19/2017 10:47    Procedures Procedures (including critical care time)  Medications Ordered in ED Medications  iopamidol (ISOVUE-300) 61 % injection (not administered)  sodium chloride 0.9 % bolus 500 mL (500 mLs Intravenous New Bag/Given 07/19/17 1431)     Initial Impression / Assessment and Plan / ED Course  I have reviewed the triage vital signs and the nursing notes.  Pertinent labs & imaging results that were available during my care of the patient were reviewed by me and considered in my medical decision making (see chart for details).     Patient with history of prior CVA with left hemiparesis for evaluation of progressive fatigue and weakness.  Labs demonstrate AK I with anemia.  Examination and chest x-ray are concerning for mild CHF.  Will provide gentle IV fluid hydration given her worsening renal function.  Medicine consulted for admission for further evaluation and treatment.  Patient updated of findings of her studies and she is in agreement with admission for further treatment.  Final Clinical Impressions(s) / ED Diagnoses   Final diagnoses:  None    ED Discharge Orders    None       Quintella Reichert, MD 07/19/17 [phone]

## 2017-07-19 NOTE — Progress Notes (Signed)
LE venous duplex prelim: negative for DVT. [name] Eunice, RDMS, RVT  

## 2017-07-19 NOTE — ED Triage Notes (Signed)
Per EMS pt from [facility] sent for evaluation related to worsening weakness over past week. Pt congestion and rhonchi; uses 2 lpm Organ.

## 2017-07-20 ENCOUNTER — Inpatient Hospital Stay (HOSPITAL_COMMUNITY): Payer: Medicare Other

## 2017-07-20 DIAGNOSIS — E785 Hyperlipidemia, unspecified: Secondary | ICD-10-CM

## 2017-07-20 DIAGNOSIS — I509 Heart failure, unspecified: Secondary | ICD-10-CM

## 2017-07-20 DIAGNOSIS — R609 Edema, unspecified: Secondary | ICD-10-CM

## 2017-07-20 DIAGNOSIS — K219 Gastro-esophageal reflux disease without esophagitis: Secondary | ICD-10-CM

## 2017-07-20 DIAGNOSIS — I10 Essential (primary) hypertension: Secondary | ICD-10-CM

## 2017-07-20 DIAGNOSIS — R0602 Shortness of breath: Secondary | ICD-10-CM | POA: Diagnosis present

## 2017-07-20 DIAGNOSIS — E1165 Type 2 diabetes mellitus with hyperglycemia: Secondary | ICD-10-CM

## 2017-07-20 LAB — COMPREHENSIVE METABOLIC PANEL
ALT: 8 U/L — ABNORMAL LOW (ref 14–54)
AST: 12 U/L — ABNORMAL LOW (ref 15–41)
Albumin: 3.5 g/dL (ref 3.5–5.0)
Alkaline Phosphatase: 36 U/L — ABNORMAL LOW (ref 38–126)
Anion gap: 9 (ref 5–15)
BUN: 29 mg/dL — ABNORMAL HIGH (ref 6–20)
CO2: 32 mmol/L (ref 22–32)
Calcium: 9.7 mg/dL (ref 8.9–10.3)
Chloride: 99 mmol/L — ABNORMAL LOW (ref 101–111)
Creatinine, Ser: 2.33 mg/dL — ABNORMAL HIGH (ref 0.44–1.00)
GFR calc Af Amer: 21 mL/min — ABNORMAL LOW (ref >60)
GFR calc non Af Amer: 18 mL/min — ABNORMAL LOW (ref >60)
Glucose, Bld: 73 mg/dL (ref 65–99)
Potassium: 4.3 mmol/L (ref 3.5–5.1)
Sodium: 140 mmol/L (ref 135–145)
Total Bilirubin: <0.1 mg/dL — ABNORMAL LOW (ref 0.3–1.2)
Total Protein: 6.8 g/dL (ref 6.5–8.1)

## 2017-07-20 LAB — IRON AND TIBC
Iron: 32 ug/dL (ref 28–170)
Saturation Ratios: 10 % — ABNORMAL LOW (ref 10.4–31.8)
TIBC: 309 ug/dL (ref 250–450)
UIBC: 277 ug/dL

## 2017-07-20 LAB — VITAMIN B12: VITAMIN B 12: 1311 pg/mL — AB (ref 180–914)

## 2017-07-20 LAB — RETICULOCYTES
RBC.: 2.77 MIL/uL — AB (ref 3.87–5.11)
RETIC COUNT ABSOLUTE: 66.5 10*3/uL (ref 19.0–186.0)
Retic Ct Pct: 2.4 % (ref 0.4–3.1)

## 2017-07-20 LAB — TROPONIN I
Troponin I: <0.03 ng/mL (ref <0.03)
Troponin I: <0.03 ng/mL (ref <0.03)

## 2017-07-20 LAB — GLUCOSE, CAPILLARY
Glucose-Capillary: 65 mg/dL (ref 65–99)
Glucose-Capillary: 160 mg/dL — ABNORMAL HIGH (ref 65–99)
Glucose-Capillary: 138 mg/dL — ABNORMAL HIGH (ref 65–99)
Glucose-Capillary: 179 mg/dL — ABNORMAL HIGH (ref 65–99)

## 2017-07-20 LAB — CBC
HCT: 29 % — ABNORMAL LOW (ref 36.0–46.0)
Hemoglobin: 8.9 g/dL — ABNORMAL LOW (ref 12.0–15.0)
MCH: 31.4 pg (ref 26.0–34.0)
MCHC: 30.7 g/dL (ref 30.0–36.0)
MCV: 102.5 fL — ABNORMAL HIGH (ref 78.0–100.0)
Platelets: 237 10*3/uL (ref 150–400)
RBC: 2.83 MIL/uL — ABNORMAL LOW (ref 3.87–5.11)
RDW: 14.9 % (ref 11.5–15.5)
WBC: 7.3 10*3/uL (ref 4.0–10.5)

## 2017-07-20 LAB — FOLATE: FOLATE: 17.4 ng/mL (ref >5.9)

## 2017-07-20 LAB — ECHOCARDIOGRAM COMPLETE
Height: 60 in
Weight: 2215.18 oz

## 2017-07-20 LAB — FERRITIN: Ferritin: 87 ng/mL (ref 11–307)

## 2017-07-20 LAB — CREATININE, URINE, RANDOM: Creatinine, Urine: 20.18 mg/dL

## 2017-07-20 LAB — SODIUM, URINE, RANDOM: Sodium, Ur: 63 mmol/L

## 2017-07-20 LAB — PROTEIN, URINE, RANDOM: Total Protein, Urine: 6 mg/dL

## 2017-07-20 MED ORDER — SIMETHICONE 80 MG PO CHEW
160.0000 mg | CHEWABLE_TABLET | Freq: Four times a day (QID) | ORAL | Status: AC
  Administered 2017-07-20 – 2017-07-23 (×12): 160 mg via ORAL
  Filled 2017-07-20 (×12): qty 2

## 2017-07-20 MED ORDER — FUROSEMIDE 10 MG/ML IJ SOLN
40.0000 mg | Freq: Two times a day (BID) | INTRAMUSCULAR | Status: DC
  Administered 2017-07-20 – 2017-07-21 (×2): 40 mg via INTRAVENOUS
  Filled 2017-07-20 (×2): qty 4

## 2017-07-20 MED ORDER — TRAMADOL HCL 50 MG PO TABS
50.0000 mg | ORAL_TABLET | Freq: Once | ORAL | Status: AC
  Administered 2017-07-20: 50 mg via ORAL
  Filled 2017-07-20: qty 1

## 2017-07-20 MED ORDER — TRAZODONE HCL 50 MG PO TABS: 50.0000 mg | ORAL_TABLET | Freq: Once | ORAL | Status: DC

## 2017-07-20 NOTE — Progress Notes (Signed)
PROGRESS NOTE    [name]  OJJ:[id number] DOB: 01-14-1929 DOA: 07/19/2017 PCP: Josetta Huddle, MD   Brief Narrative:  Patient is an 84 year old Caucasian female, very poor historian, assisted living facility resident, with a past medical history significant for CVA, chronic kidney disease, hypertension, pneumonia, diabetes mellitus, gout, falls, GI bleed and history of cancer status post irradiation.  The patient could only tell me that she was intermittently short of breath, with O2 sat in the 80s, and has had swelling involving the legs and the hands.  Patient also reported about gaseous distention of the abdomen.  His CT scan of the abdomen and pelvis is nonrevealing.  It only revealed cholelithiasis.  Chest x-ray revealed bilateral mild pleural effusion.  Patient denies headache, no neck pain, no chest pain, no fever or chills, no cough, no urinary symptoms.  The patient is also said to be progressively weak.  Workup done revealed serum creatinine of 2.59, up from 0.86 about 2-3 years ago.  There is no history of NSAID use.  ED Course: CT of the abdomen done in the ER revealed cholelithiasis.  UA is nonrevealing.  BMP reveals sodium of 137, potassium of 4.7, CO2 27, BUN of 33 with creatinine of 2.59 blood sugar of 113.  Cardiac BNP is 282.5.  Troponin is less than 0.03.  CBC reveals WBC of 8.8, hemoglobin of 8.7, hematocrit of 27.6, MCV of 103 with platelet count of 230     Assessment & Plan:   Principal Problem:   AKI (acute kidney injury) ([place]) Active Problems:   Hypertension   Diabetes type 2, uncontrolled ([place])   Edema   Shortness of breath   Gastroesophageal reflux disease   Hyperlipidemia  1 acute renal failure versus progressive chronic kidney disease Patient presented with acute renal failure with creatinine of 2.59 on admission.  Last creatinine noted was 0.86 August 01, 2013.  Patient presented with volume overload as she was noted to have lower extremity edema, increased  abdominal distention/girth.  Urinalysis negative for protein.  Urine protein was 6.  Urine sodium 63, urine creatinine of 20.18.  Renal ultrasound with increased echotexture and cortical thinning bilaterally compatible with chronic medical renal disease, negative for hydronephrosis.  Renal ultrasound is a change from 07/22/2013.  Patient with a urine output of 850 cc over the past 24 hours.  Continue diuretics with IV Lasix and follow renal function.  Will likely need to follow-up in the outpatient setting with nephrology and PCP.  2.  Shortness of breath/lower extremity edema Questionable etiology.  Concern for progressive chronic kidney disease versus cardiac etiology.  Patient placed on IV Lasix with urine output of 850 cc over the past 24 hours and clinical improvement.  2D echo has been done and results pending.  BNP was 282.5 however patient presented with acute renal failure.  Cycle cardiac enzymes every 6 hours x3.  Continue IV Lasix.  Strict I's and O's.  Daily weights.  If 2D echo is abnormal will need to consult with cardiology for further evaluation and management.  3.  Hypertension Continue current regimen of Norvasc, Zebeta, hydralazine, diuretics.  Follow.  4.  Diabetes mellitus type 2 Hemoglobin A1c was 8.0 on 07/21/2013.  Repeat hemoglobin A1c.  CBGs are ranging from 65-160.  Continue current dose of Lantus.  Sliding scale insulin.  Continue gabapentin.  5.  Gastroesophageal reflux disease PPI.  Simethicone 4 times daily times 2 days secondary to concern for bloating.  6.  Hyperlipidemia Check a  fasting lipid panel.  Continue statin.  7.  Gout Stable.  Continue allopurinol.  7.   DVT prophylaxis: Heparin Code Status: Full Family Communication: Updated patient.  No family at bedside. Disposition Plan: Home was renal function improved and back to baseline, clinical improvement and per PT evaluation.   Consultants:   None  Procedures:   2D echo pending 07/20/2017  Renal  ultrasound 07/20/2017  Antimicrobials:   None   Subjective: Chief complaint of bloated feeling in the abdomen.  Patient states swelling in lower extremities has improved since admission.  Patient denies any chest pain.  No shortness of breath.  Patient states good urine output.  Objective: Vitals:   07/20/17 0529 07/20/17 0856 07/20/17 1400 07/20/17 1822  BP: (!) 160/59  (!) 134/43 (!) 147/45  Pulse: 64  65   Resp: 19  18   Temp: 98.2 F (36.8 C)  98.2 F (36.8 C)   TempSrc: Oral  Oral   SpO2: 98%  95%   Weight:  107.3 kg (236 lb 9.6 oz)    Height:        Intake/Output Summary (Last 24 hours) at 07/20/2017 2048 Last data filed at 07/20/2017 0149 Gross per 24 hour  Intake 120 ml  Output 550 ml  Net -430 ml   Filed Weights   07/19/17 1738 07/20/17 0856  Weight: 62.8 kg (138 lb 7.2 oz) 107.3 kg (236 lb 9.6 oz)    Examination:  General exam: Appears calm and comfortable  Respiratory system: Some crackles in the bases.  No wheezes, no rhonchi.  Fair air movement. Respiratory effort normal. Cardiovascular system: S1 & S2 heard, RRR. No JVD, murmurs, rubs, gallops or clicks.  Trace bilateral lower extremity edema.   Gastrointestinal system: Abdomen is mildly distended, soft, nontender, positive bowel sounds.  No rebound.  No guarding.   Central nervous system: Alert and oriented. No focal neurological deficits. Extremities: Symmetric 5 x 5 power. Skin: No rashes, lesions or ulcers Psychiatry: Judgement and insight appear normal. Mood & affect appropriate.     Data Reviewed: I have personally reviewed following labs and imaging studies  CBC: Recent Labs  Lab 07/19/17 1012 07/20/17 0557  WBC 8.8 7.3  NEUTROABS 5.6  --   HGB 8.7* 8.9*  HCT 27.6* 29.0*  MCV 103.0* 102.5*  PLT 230 253   Basic Metabolic Panel: Recent Labs  Lab 07/19/17 1012 07/20/17 0557  NA 137 140  K 4.7 4.3  CL 100* 99*  CO2 27 32  GLUCOSE 113* 73  BUN 33* 29*  CREATININE 2.59* 2.33*    CALCIUM 9.4 9.7  MG 2.4  --   PHOS 4.5  --    GFR: Estimated Creatinine Clearance: 19.2 mL/min (A) (by C-G formula based on SCr of 2.33 mg/dL (H)). Liver Function Tests: Recent Labs  Lab 07/19/17 1012 07/20/17 0557  AST 14* 12*  ALT 8* 8*  ALKPHOS 35* 36*  BILITOT 0.5 <0.1*  PROT 6.6 6.8  ALBUMIN 3.4* 3.5   No results for input(s): LIPASE, AMYLASE in the last 168 hours. No results for input(s): AMMONIA in the last 168 hours. Coagulation Profile: No results for input(s): INR, PROTIME in the last 168 hours. Cardiac Enzymes: Recent Labs  Lab 07/19/17 1012 07/20/17 0925 07/20/17 1421  TROPONINI <0.03 <0.03 <0.03   BNP (last 3 results) No results for input(s): PROBNP in the last 8760 hours. HbA1C: No results for input(s): HGBA1C in the last 72 hours. CBG: Recent Labs  Lab 07/19/17 1845  07/19/17 2120 07/20/17 0825 07/20/17 1227 07/20/17 1737  GLUCAP 110* 159* 65 160* 138*   Lipid Profile: No results for input(s): CHOL, HDL, LDLCALC, TRIG, CHOLHDL, LDLDIRECT in the last 72 hours. Thyroid Function Tests: No results for input(s): TSH, T4TOTAL, FREET4, T3FREE, THYROIDAB in the last 72 hours. Anemia Panel: Recent Labs    07/20/17 0557 07/20/17 1421  VITAMINB12  --  1,311*  FOLATE  --  17.4  FERRITIN  --  87  TIBC  --  309  IRON  --  32  RETICCTPCT 2.4  --    Sepsis Labs: No results for input(s): PROCALCITON, LATICACIDVEN in the last 168 hours.  Recent Results (from the past 240 hour(s))  MRSA PCR Screening     Status: None   Collection Time: 07/19/17  5:43 PM  Result Value Ref Range Status   MRSA by PCR NEGATIVE NEGATIVE Final    Comment:        The GeneXpert MRSA Assay (FDA approved for NASAL specimens only), is one component of a comprehensive MRSA colonization surveillance program. It is not intended to diagnose MRSA infection nor to guide or monitor treatment for MRSA infections. Performed at [facility], [place] [address] [place], [place] [address]          Radiology Studies: Ct Abdomen Pelvis Wo Contrast  Result Date: 07/19/2017 CLINICAL DATA:  Abdominal pain and distension EXAM: CT ABDOMEN AND PELVIS WITHOUT CONTRAST TECHNIQUE: Multidetector CT imaging of the abdomen and pelvis was performed following the standard protocol without IV contrast. COMPARISON:  Plain film from earlier in the same day. FINDINGS: Lower chest: Small bilateral pleural effusions are noted. Bibasilar atelectatic changes are seen as well. Hepatobiliary: Liver is within normal limits. The gallbladder is well distended and demonstrates multiple small dependent gallstones. Pancreas: Unremarkable. No pancreatic ductal dilatation or surrounding inflammatory changes. Spleen: Normal in size without focal abnormality. Adrenals/Urinary Tract: Adrenal glands are within normal limits. Scattered renal vascular calcifications are seen. No renal calculi are noted. No obstructive changes are seen. The bladder is well distended. Stomach/Bowel: The appendix is not well visualized although no inflammatory changes are identified. Mild retained fecal material is seen. No obstructive or inflammatory changes are noted. Vascular/Lymphatic: Aortic atherosclerosis. No enlarged abdominal or pelvic lymph nodes. Reproductive: Status post hysterectomy. No adnexal masses. Other: No abdominal wall hernia or abnormality. No abdominopelvic ascites. Musculoskeletal: Degenerative changes of lumbar spine are noted. Anterolisthesis of L4 on L5 is noted. IMPRESSION: Bilateral pleural effusions and basilar atelectatic changes. Cholelithiasis without complicating factors. No acute abnormality noted. Electronically Signed   By: Inez Catalina M.D.   On: 07/19/2017 13:26   Dg Chest 2 View  Result Date: 07/19/2017 CLINICAL DATA:  Pt states SOB, weakness, swelling of feet and legs increasing x 3 days - hx endometrial cancer 2012 - diabetic - hx hypertension -former smoker EXAM: CHEST - 2 VIEW  COMPARISON:  Chest x-ray dated 05/19/2015 FINDINGS: Heart size and mediastinal contours are stable. Atherosclerotic changes noted at the aortic arch. Blunting at the costophrenic angles bilaterally. Lungs otherwise stable. No pneumothorax seen. No acute or suspicious osseous finding. IMPRESSION: 1. Small bilateral pleural effusions, new compared to previous chest x-ray of 05/19/2015. 2. No evidence of pneumonia or pulmonary edema. 3. Aortic atherosclerosis. Electronically Signed   By: Franki Cabot M.D.   On: 07/19/2017 10:47   US Renal  Result Date: 07/20/2017 CLINICAL DATA:  Acute renal failure on chronic kidney disease. EXAM: RENAL / URINARY TRACT ULTRASOUND  COMPLETE COMPARISON:  CT 07/19/2016 FINDINGS: Right Kidney: Length: 9.7 cm. Increased echotexture. Cortical thinning. No mass or hydronephrosis. Left Kidney: Length: 9.9 cm. Increased echotexture and cortical thinning. No hydronephrosis. 1.4 cm upper pole cyst. Bladder: Appears normal for degree of bladder distention. IMPRESSION: Increased echotexture and cortical thinning bilaterally compatible chronic medical renal disease. No hydronephrosis. Electronically Signed   By: Rolm Baptise M.D.   On: 07/20/2017 10:23        Scheduled Meds: . allopurinol  150 mg Oral QHS  . amLODipine  10 mg Oral Daily  . bisoprolol  10 mg Oral Daily  . cholecalciferol  8,000 Units Oral Daily  . divalproex  375 mg Oral QHS  . ferrous sulfate  325 mg Oral Q breakfast  . FLUoxetine  20 mg Oral Daily  . fluticasone  1 spray Each Nare Daily  . furosemide  40 mg Intravenous Q12H  . gabapentin  300 mg Oral QHS  . heparin  5,000 Units Subcutaneous Q8H  . hydrALAZINE  25 mg Oral QID  . insulin aspart  0-5 Units Subcutaneous QHS  . insulin aspart  0-9 Units Subcutaneous TID WC  . insulin glargine  20 Units Subcutaneous Q12H  . magnesium oxide  400 mg Oral Daily  . olopatadine  1 drop Both Eyes BID  . pantoprazole  20 mg Oral Daily  . rOPINIRole  1 mg Oral BID    . simethicone  160 mg Oral QID  . simvastatin  20 mg Oral QHS  . traZODone  50 mg Oral Once  . vitamin B-12  1,000 mcg Oral Daily   Continuous Infusions:   LOS: 1 day    Time spent: 35 minutes    Irine Seal, MD [facility] Pager [phone]  If 7PM-7AM, please contact night-coverage [email or web address] Password [facility] 07/20/2017, 8:48 PM

## 2017-07-20 NOTE — Progress Notes (Signed)
  Echocardiogram 2D Echocardiogram has been performed.  Darlina Sicilian M 07/20/2017, 11:06 AM

## 2017-07-21 LAB — BASIC METABOLIC PANEL
Anion gap: 6 (ref 5–15)
BUN: 35 mg/dL — ABNORMAL HIGH (ref 6–20)
CHLORIDE: 96 mmol/L — AB (ref 101–111)
CO2: 36 mmol/L — AB (ref 22–32)
Calcium: 9.6 mg/dL (ref 8.9–10.3)
Creatinine, Ser: 2.72 mg/dL — ABNORMAL HIGH (ref 0.44–1.00)
GFR calc Af Amer: 17 mL/min — ABNORMAL LOW (ref >60)
GFR calc non Af Amer: 15 mL/min — ABNORMAL LOW (ref >60)
GLUCOSE: 110 mg/dL — AB (ref 65–99)
POTASSIUM: 4.4 mmol/L (ref 3.5–5.1)
Sodium: 138 mmol/L (ref 135–145)

## 2017-07-21 LAB — LIPID PANEL
Cholesterol: 124 mg/dL (ref 0–200)
HDL: 28 mg/dL — AB (ref >40)
LDL Cholesterol: 23 mg/dL (ref 0–99)
Total CHOL/HDL Ratio: 4.4 RATIO
Triglycerides: 366 mg/dL — ABNORMAL HIGH (ref <150)
VLDL: 73 mg/dL — ABNORMAL HIGH (ref 0–40)

## 2017-07-21 LAB — PARATHYROID HORMONE, INTACT (NO CA): PTH: 45 pg/mL (ref 15–65)

## 2017-07-21 LAB — GLUCOSE, CAPILLARY
Glucose-Capillary: 104 mg/dL — ABNORMAL HIGH (ref 65–99)
Glucose-Capillary: 214 mg/dL — ABNORMAL HIGH (ref 65–99)
Glucose-Capillary: 149 mg/dL — ABNORMAL HIGH (ref 65–99)
Glucose-Capillary: 135 mg/dL — ABNORMAL HIGH (ref 65–99)

## 2017-07-21 LAB — CBC
HEMATOCRIT: 27.5 % — AB (ref 36.0–46.0)
HEMOGLOBIN: 8.6 g/dL — AB (ref 12.0–15.0)
MCH: 32.3 pg (ref 26.0–34.0)
MCHC: 31.3 g/dL (ref 30.0–36.0)
MCV: 103.4 fL — AB (ref 78.0–100.0)
Platelets: 252 10*3/uL (ref 150–400)
RBC: 2.66 MIL/uL — ABNORMAL LOW (ref 3.87–5.11)
RDW: 14.8 % (ref 11.5–15.5)
WBC: 6.7 10*3/uL (ref 4.0–10.5)

## 2017-07-21 LAB — HEMOGLOBIN A1C
HEMOGLOBIN A1C: 6.5 % — AB (ref 4.8–5.6)
Hgb A1c MFr Bld: 6.5 % — ABNORMAL HIGH (ref 4.8–5.6)
MEAN PLASMA GLUCOSE: 139.85 mg/dL
Mean Plasma Glucose: 140 mg/dL

## 2017-07-21 LAB — MAGNESIUM: Magnesium: 2.3 mg/dL (ref 1.7–2.4)

## 2017-07-21 MED ORDER — FUROSEMIDE 10 MG/ML IJ SOLN
60.0000 mg | Freq: Three times a day (TID) | INTRAMUSCULAR | Status: AC
  Administered 2017-07-21 – 2017-07-22 (×4): 60 mg via INTRAVENOUS
  Filled 2017-07-21 (×4): qty 6

## 2017-07-21 NOTE — Consult Note (Signed)
Renal Service Consult Note [facility]  [name] 07/21/2017 [name] Requesting Physician:  DR Grandville Silos, D.   Reason for Consult:  Renal failure HPI: The patient is a 84 y.o. year-old with hx of HTN, DM2, CVA's, CKD, parkinsons, GIB, anemia presented to ED from [facility] on 3/8 for worsening gen'd weakness over past 1 week, also chest congestion and cough, on 2L [place] at SNF. Workup in ED > CT abdomen was negative, CXR showed bilat mild effusions, creat 2.6. UA neg, K ok, Hb 8.7, plt 230.   Pt admitted for AKI w/ weakness and fatigue. Asked to see for renal failure.   BP's today 150 6/0, HR 60, tmax 99.4 yest.  UOP good, got IV lasix 40 mg x 2, now dc'd. Home bisoprolol and hydralazine are continued here, home cozaar and norvasc are on hold.  Making urine.   Pt says feeling better today, leg swelling and SOB are better. I/O neg 2 L since admission. Sitting up in chair, no abd pain, +abd swelling.    Admits: 11/02 - syncope, low BS, hx CVA '02, parkinsons, HTN, DM2, neuropathy, gait d/o, depression, gerd 3/08 - R [place] CVA, DM, HTN, parkinsons 4/12 - FTT, frequent falls, malig HTN, parkinsons, DM2, hx endometrial Ca, hx CVAs 3/15 - AMS/ hallucinations, AKI, CKD creat 1.4 baseline, AKI due to meds, dehydration, uncont DM2, HTN, gout 3/15 - GI bleed, Hb 5 due to nsaids./ guiac+ stool, rx'd PPI and prbc's, no aggressive work-up per family  ROS  denies CP  no joint pain   no HA  no blurry vision  no rash  no diarrhea  no nausea/ vomiting  no dysuria  no difficulty voiding  no change in urine color    Past Medical History  Past Medical History:  Diagnosis Date  . Cancer ([place]) 07/04/10   endometrial  . Diabetes mellitus   . Falling episodes   . Gout   . Hx of radiation therapy 09/13/10- 10/19/10, 6/12, 6/19, 11/14/10   pelvis, external beam tehn brachytherapy  . Hypertension   . Pneumonia   . Renal disorder   . Stroke [facility])    L sided deficits  w/slurred speech   Past Surgical History  Past Surgical History:  Procedure Laterality Date  . ABDOMINAL HYSTERECTOMY     Family History  Family History  Problem Relation Age of Onset  . Hypertension Mother   . Hypertension Father    Social History  reports that she has quit smoking. Her smoking use included cigarettes. she has never used smokeless tobacco. She reports that she does not drink alcohol or use drugs. Allergies  Allergies  Allergen Reactions  . Penicillins Other (See Comments)    unknown   Home medications Prior to Admission medications   Medication Sig Start Date End Date Taking? Authorizing Provider  acetaminophen (TYLENOL) 325 MG tablet Take 650 mg by mouth every 8 (eight) hours as needed for moderate pain.   Yes [provider]  allopurinol (ZYLOPRIM) 300 MG tablet Take 150 mg by mouth at bedtime.   Yes [provider]  alum & mag hydroxide-simeth (MAALOX/MYLANTA) 200-200-20 MG/5ML suspension Take 30 mLs by mouth every 4 (four) hours as needed for indigestion or heartburn.   Yes [provider]  amLODipine (NORVASC) 10 MG tablet Take 10 mg by mouth daily. 07/11/17  Yes [provider]  bisoprolol (ZEBETA) 10 MG tablet Take 1 tablet (10 mg total) by mouth daily. 07/23/13  Yes  Josetta Huddle, MD  Cholecalciferol (HM VITAMIN D3) 4000 UNITS CAPS Take 8,000 Units by mouth daily.   Yes [provider]  Cyanocobalamin (VITAMIN B 12 PO) Take 1 tablet by mouth daily.    Yes [provider]  divalproex (DEPAKOTE SPRINKLE) 125 MG capsule Take 375 mg by mouth at bedtime. 07/14/17  Yes [provider]  ferrous sulfate 325 (65 FE) MG tablet Take 325 mg by mouth daily with breakfast.   Yes [provider]  FLUoxetine (PROZAC) 20 MG capsule Take 20 mg by mouth every morning. 07/09/17  Yes [provider]  fluticasone (FLONASE) 50 MCG/ACT nasal spray Place 1 spray into both nostrils daily.   Yes [provider]  furosemide (LASIX) 20 MG tablet Take 20 mg by mouth.   Yes [provider]  gabapentin (NEURONTIN) 400 MG capsule Take 400 mg by mouth at bedtime.   Yes [provider]  hydrALAZINE (APRESOLINE) 25 MG tablet Take 25 mg by mouth 4 (four) times daily.    Yes [provider]  insulin aspart (NOVOLOG FLEXPEN) 100 UNIT/ML FlexPen Inject 100 Units into the skin 2 (two) times daily.   Yes [provider]  Insulin Glargine (LANTUS SOLOSTAR) 100 UNIT/ML Solostar Pen Inject 40 Units into the skin every morning. Patient taking differently: Inject 80 Units into the skin at bedtime.  07/23/13  Yes Josetta Huddle, MD  linagliptin (TRADJENTA) 5 MG TABS tablet Take 5 mg by mouth daily.   Yes [provider]  losartan (COZAAR) 100 MG tablet Take 1 tablet (100 mg total) by mouth daily. 07/23/13  Yes Josetta Huddle, MD  magnesium oxide (MAG-OX) 400 MG tablet Take 400 mg by mouth daily.   Yes [provider]  Olopatadine HCl 0.2 % SOLN Place 1 drop into both eyes daily. 07/08/17  Yes [provider]  pantoprazole (PROTONIX) 20 MG tablet Take 20 mg by mouth daily. 07/04/17  Yes [provider]  promethazine (PHENERGAN) 25 MG suppository Place 25 mg rectally every 6 (six) hours as needed for nausea.   Yes [provider]  promethazine (PHENERGAN) 25 MG tablet Take 25 mg by mouth every 6 (six) hours as needed for nausea.   Yes [provider]  rOPINIRole (REQUIP) 1 MG tablet Take 1 mg by mouth 2 (two) times daily.   Yes [provider]  simethicone (MYLICON) 80 MG chewable tablet Chew 80 mg by mouth 3 (three) times daily as needed for flatulence (and bloating).   Yes [provider]  simvastatin (ZOCOR) 20 MG tablet Take 20 mg by mouth at bedtime. 07/18/17  Yes [provider]  sodium chloride (DEEP SEA NASAL SPRAY) 0.65 % nasal spray Place 1 spray into the nose 4 (four) times daily as needed for  congestion.    Yes [provider]  tetrahydrozoline 0.05 % ophthalmic solution Place 2 drops into both eyes 2 (two) times daily.   Yes [provider]  traMADol (ULTRAM) 50 MG tablet Take 50 mg by mouth 2 (two) times daily. 1/2 tablet twice daily as needed for pain, and 1 tablet at bedtime   Yes [provider]  traZODone (DESYREL) 50 MG tablet Take 50 mg by mouth at bedtime. 06/25/17  Yes [provider]   Liver Function Tests Recent Labs  Lab 07/19/17 1012 07/20/17 0557  AST 14* 12*  ALT 8* 8*  ALKPHOS 35* 36*  BILITOT 0.5 <0.1*  PROT 6.6 6.8  ALBUMIN 3.4* 3.5  No results for input(s): LIPASE, AMYLASE in the last 168 hours. CBC Recent Labs  Lab 07/19/17 1012 07/20/17 0557 07/21/17 0601  WBC 8.8 7.3 6.7  NEUTROABS 5.6  --   --   HGB 8.7* 8.9* 8.6*  HCT 27.6* 29.0* 27.5*  MCV 103.0* 102.5* 103.4*  PLT 230 237 751   Basic Metabolic Panel Recent Labs  Lab 07/19/17 1012 07/20/17 0557 07/21/17 0601  NA 137 140 138  K 4.7 4.3 4.4  CL 100* 99* 96*  CO2 27 32 36*  GLUCOSE 113* 73 110*  BUN 33* 29* 35*  CREATININE 2.59* 2.33* 2.72*  CALCIUM 9.4 9.7 9.6  PHOS 4.5  --   --    Iron/TIBC/Ferritin/ %Sat    Component Value Date/Time   IRON 32 07/20/2017 1421   TIBC 309 07/20/2017 1421   FERRITIN 87 07/20/2017 1421   IRONPCTSAT 10 (L) 07/20/2017 1421    Vitals:   07/21/17 0535 07/21/17 1045 07/21/17 1100 07/21/17 1500  BP: (!) 145/38   (!) 150/63  Pulse: 61   62  Resp: 18   18  Temp: 99.6 F (37.6 C)   (!) 97.4 F (36.3 C)  TempSrc: Oral   Oral  SpO2: 100% 99% 93% 94%  Weight: 58.4 kg (128 lb 12 oz)     Height:       Exam Gen elderly female, HOH, up in chair No rash, cyanosis or gangrene Sclera anicteric, throat clear  No jvd or bruits Chest rales L base, R clear  RRR no MRG  Abd distended, a bit firm, nontender, dec'd BS GU defer MS no joint effusions or deformity Ext trace LLE pretib edema, no UE or hip  edema Neuro is alert, Ox 3 , nf, gen'd weakness   Home meds: - norvasc 10/ lasix 20 qd/ hydralazine 25 qid/ cozaar 100/ bisoprolol 10 qd - lantus 40/ novolog / linagliptin  5mg  - neurontin 400 hs/ depakote 375 hs/ prozac 20 / requip / ultram prn/ trazodone 50 hs - zocor/ PPI/ allopurinol/ vitamins/ prns  Date  Creat  Notes   2011- 12 0.9 2013  0.7- 1.16 Jul 2013 6.2 >>> 0.8  AKI episode Mar 2019 2.3- 2.7 Current   Renal US showed 9.7 cm kidneys with moderate cortical thinning bilat.  CXR - small bilat effusion, no edema ECHO - G2DD, EF 65% UA > negative UNa 63 , UCr 21, urine protein 6 mg/dL    Impression: 1. Acute on CRF/ vs progression of CKD - in setting of what looks like decomp CHF and/or simple vol overload from CKD (SOB/ leg edema/ abd swelling).  Will resume IV lasix and diurese some more.  BP's up. Follow creat, not sure baseline since last reading was from 2015.  Conservative care overall given age and comorbidities.  2. HTN - bp's up, holding cozaar and norvasc, on hydral/ BB for now, bp's good 3. DM on insulin 4. Gout 5. Depression 6. Hx CVA/ left hemiparesis  Plan - as above  Kelly Splinter MD [employer] pager [phone]   07/21/2017, 4:03 PM

## 2017-07-21 NOTE — Progress Notes (Signed)
PROGRESS NOTE    [name]  JME:[id number] DOB: 04-18-1929 DOA: 07/19/2017 PCP: Josetta Huddle, MD   Brief Narrative:  Patient is an 84 year old Caucasian female, very poor historian, assisted living facility resident, with a past medical history significant for CVA, chronic kidney disease, hypertension, pneumonia, diabetes mellitus, gout, falls, GI bleed and history of cancer status post irradiation.  The patient could only tell me that she was intermittently short of breath, with O2 sat in the 80s, and has had swelling involving the legs and the hands.  Patient also reported about gaseous distention of the abdomen.  His CT scan of the abdomen and pelvis is nonrevealing.  It only revealed cholelithiasis.  Chest x-ray revealed bilateral mild pleural effusion.  Patient denies headache, no neck pain, no chest pain, no fever or chills, no cough, no urinary symptoms.  The patient is also said to be progressively weak.  Workup done revealed serum creatinine of 2.59, up from 0.86 about 2-3 years ago.  There is no history of NSAID use.  ED Course: CT of the abdomen done in the ER revealed cholelithiasis.  UA is nonrevealing.  BMP reveals sodium of 137, potassium of 4.7, CO2 27, BUN of 33 with creatinine of 2.59 blood sugar of 113.  Cardiac BNP is 282.5.  Troponin is less than 0.03.  CBC reveals WBC of 8.8, hemoglobin of 8.7, hematocrit of 27.6, MCV of 103 with platelet count of 230     Assessment & Plan:   Principal Problem:   AKI (acute kidney injury) ([place]) Active Problems:   Hypertension   Diabetes type 2, uncontrolled ([place])   Edema   Shortness of breath   Gastroesophageal reflux disease   Hyperlipidemia  1 acute renal failure versus progressive chronic kidney disease Patient presented with acute renal failure with creatinine of 2.59 on admission.  Last creatinine noted was 0.86 August 01, 2013.  Patient presented with volume overload as she was noted to have lower extremity edema, increased  abdominal distention/girth.  Urinalysis negative for protein.  Urine protein was 6.  Urine sodium 63, urine creatinine of 20.18.  Renal ultrasound with increased echotexture and cortical thinning bilaterally compatible with chronic medical renal disease, negative for hydronephrosis.  Renal ultrasound is a change from 07/22/2013.  Patient with a urine output of 900 cc over the past 24 hours.  Renal function slightly increased from 07/20/2017 and as such we will discontinue IV Lasix.  Will consult with nephrology for further evaluation and management.  2.  Shortness of breath/lower extremity edema Questionable etiology.  Concern for progressive chronic kidney disease versus cardiac etiology.  Patient placed on IV Lasix with urine output of 900 cc over the past 24 hours and clinical improvement.  2D echo with EF of 65-70%, no wall motion abnormalities, grade 2 diastolic dysfunction.  Cardiac enzymes negative x3.  Doubt of lower extremity edema is cardiac in etiology.  Will discontinue IV Lasix due to no significant improvement with renal function.   3.  Hypertension Stable.  Will discontinue IV Lasix and Norvasc due to lower extremity edema.  Continue Zebeta, hydralazine, Follow.  4.  Diabetes mellitus type 2 Hemoglobin A1c was 8.0 on 07/21/2013.  Repeat hemoglobin A1c 6.5 on 07/21/2017.  CBGs are ranging from 104-214.  Continue current dose of Lantus and sliding scale insulin. Continue gabapentin.  5.  Gastroesophageal reflux disease Continue PPI and simethicone.   6.  Hyperlipidemia Fasting lipid panel with a total cholesterol 124, LDL of 23, triglycerides of  366.  Continue statin.  Outpatient follow-up.   7.  Gout Stable.  Continue allopurinol.   DVT prophylaxis: Heparin Code Status: Full Family Communication: Updated patient.  No family at bedside. Disposition Plan: Home was renal function improved and back to baseline, clinical improvement and per PT evaluation.   Consultants:   Nephrology:  Dr. Jonnie Finner 07/21/2017  Procedures:   2D echo 07/20/2017  Renal ultrasound 07/20/2017  Antimicrobials:   None   Subjective: Sitting up in chair.  States abdominal bloating feeling has improved.  Denies any shortness of breath or chest pain.  States lower extremity edema improved.  She states she is making urine.  Per patient and daughter mostly wheelchair-bound.   Objective: Vitals:   07/21/17 0535 07/21/17 1045 07/21/17 1100 07/21/17 1500  BP: (!) 145/38   (!) 150/63  Pulse: 61   62  Resp: 18   18  Temp: 99.6 F (37.6 C)   (!) 97.4 F (36.3 C)  TempSrc: Oral   Oral  SpO2: 100% 99% 93% 94%  Weight: 58.4 kg (128 lb 12 oz)     Height:        Intake/Output Summary (Last 24 hours) at 07/21/2017 1749 Last data filed at 07/21/2017 1300 Gross per 24 hour  Intake 580 ml  Output 1500 ml  Net -920 ml   Filed Weights   07/19/17 1738 07/20/17 0856 07/21/17 0535  Weight: 62.8 kg (138 lb 7.2 oz) 107.3 kg (236 lb 9.6 oz) 58.4 kg (128 lb 12 oz)    Examination:  General exam: Appears calm and comfortable  Respiratory system: Lungs clear to auscultation bilaterally.  No wheezes, no crackles, no rhonchi.  Fair air movement.  Cardiovascular system: Regular rate rhythm no murmurs rubs or gallops.  No lower extremity edema.  Gastrointestinal system: Abdomen is soft, nontender, nondistended, positive bowel sounds.  No rebound.  No guarding.   Central nervous system: Alert and oriented. No focal neurological deficits. Extremities: Symmetric 5 x 5 power. Skin: No rashes, lesions or ulcers Psychiatry: Judgement and insight appear normal. Mood & affect appropriate.     Data Reviewed: I have personally reviewed following labs and imaging studies  CBC: Recent Labs  Lab 07/19/17 1012 07/20/17 0557 07/21/17 0601  WBC 8.8 7.3 6.7  NEUTROABS 5.6  --   --   HGB 8.7* 8.9* 8.6*  HCT 27.6* 29.0* 27.5*  MCV 103.0* 102.5* 103.4*  PLT 230 237 161   Basic Metabolic Panel: Recent Labs  Lab  07/19/17 1012 07/20/17 0557 07/21/17 0601  NA 137 140 138  K 4.7 4.3 4.4  CL 100* 99* 96*  CO2 27 32 36*  GLUCOSE 113* 73 110*  BUN 33* 29* 35*  CREATININE 2.59* 2.33* 2.72*  CALCIUM 9.4 9.7 9.6  MG 2.4  --  2.3  PHOS 4.5  --   --    GFR: Estimated Creatinine Clearance: 11.9 mL/min (A) (by C-G formula based on SCr of 2.72 mg/dL (H)). Liver Function Tests: Recent Labs  Lab 07/19/17 1012 07/20/17 0557  AST 14* 12*  ALT 8* 8*  ALKPHOS 35* 36*  BILITOT 0.5 <0.1*  PROT 6.6 6.8  ALBUMIN 3.4* 3.5   No results for input(s): LIPASE, AMYLASE in the last 168 hours. No results for input(s): AMMONIA in the last 168 hours. Coagulation Profile: No results for input(s): INR, PROTIME in the last 168 hours. Cardiac Enzymes: Recent Labs  Lab 07/19/17 1012 07/20/17 0925 07/20/17 1421 07/20/17 2029  TROPONINI <0.03 <0.03 <0.03 <0.03  BNP (last 3 results) No results for input(s): PROBNP in the last 8760 hours. HbA1C: Recent Labs    07/20/17 0557 07/21/17 0601  HGBA1C 6.5* 6.5*   CBG: Recent Labs  Lab 07/20/17 1737 07/20/17 2133 07/21/17 0745 07/21/17 1203 07/21/17 1704  GLUCAP 138* 179* 104* 214* 149*   Lipid Profile: Recent Labs    07/21/17 0601  CHOL 124  HDL 28*  LDLCALC 23  TRIG 366*  CHOLHDL 4.4   Thyroid Function Tests: No results for input(s): TSH, T4TOTAL, FREET4, T3FREE, THYROIDAB in the last 72 hours. Anemia Panel: Recent Labs    07/20/17 0557 07/20/17 1421  VITAMINB12  --  1,311*  FOLATE  --  17.4  FERRITIN  --  87  TIBC  --  309  IRON  --  32  RETICCTPCT 2.4  --    Sepsis Labs: No results for input(s): PROCALCITON, LATICACIDVEN in the last 168 hours.  Recent Results (from the past 240 hour(s))  MRSA PCR Screening     Status: None   Collection Time: 07/19/17  5:43 PM  Result Value Ref Range Status   MRSA by PCR NEGATIVE NEGATIVE Final    Comment:        The GeneXpert MRSA Assay (FDA approved for NASAL specimens only), is one  component of a comprehensive MRSA colonization surveillance program. It is not intended to diagnose MRSA infection nor to guide or monitor treatment for MRSA infections. Performed at [facility], [place] [address], [place], [place] [address]          Radiology Studies: US Renal  Result Date: 07/20/2017 CLINICAL DATA:  Acute renal failure on chronic kidney disease. EXAM: RENAL / URINARY TRACT ULTRASOUND COMPLETE COMPARISON:  CT 07/19/2016 FINDINGS: Right Kidney: Length: 9.7 cm. Increased echotexture. Cortical thinning. No mass or hydronephrosis. Left Kidney: Length: 9.9 cm. Increased echotexture and cortical thinning. No hydronephrosis. 1.4 cm upper pole cyst. Bladder: Appears normal for degree of bladder distention. IMPRESSION: Increased echotexture and cortical thinning bilaterally compatible chronic medical renal disease. No hydronephrosis. Electronically Signed   By: Rolm Baptise M.D.   On: 07/20/2017 10:23        Scheduled Meds: . allopurinol  150 mg Oral QHS  . bisoprolol  10 mg Oral Daily  . cholecalciferol  8,000 Units Oral Daily  . divalproex  375 mg Oral QHS  . ferrous sulfate  325 mg Oral Q breakfast  . FLUoxetine  20 mg Oral Daily  . fluticasone  1 spray Each Nare Daily  . furosemide  60 mg Intravenous Q8H  . gabapentin  300 mg Oral QHS  . heparin  5,000 Units Subcutaneous Q8H  . hydrALAZINE  25 mg Oral QID  . insulin aspart  0-5 Units Subcutaneous QHS  . insulin aspart  0-9 Units Subcutaneous TID WC  . insulin glargine  20 Units Subcutaneous Q12H  . magnesium oxide  400 mg Oral Daily  . olopatadine  1 drop Both Eyes BID  . pantoprazole  20 mg Oral Daily  . rOPINIRole  1 mg Oral BID  . simethicone  160 mg Oral QID  . simvastatin  20 mg Oral QHS  . traZODone  50 mg Oral Once  . vitamin B-12  1,000 mcg Oral Daily   Continuous Infusions:   LOS: 2 days    Time spent: 35 minutes    Irine Seal, MD [facility] Pager [phone]  [phone]  If 7PM-7AM, please contact night-coverage [email or web address] Password [facility] 07/21/2017, 5:49 PM

## 2017-07-21 NOTE — Progress Notes (Signed)
PT Cancellation Note  Patient Details Name: [name] MRN: [id number] DOB: October 14, 1928   Cancelled Treatment:    Reason Eval/Treat Not Completed: Other (comment)(Spoke to RN. She reports family to bring her AFO tomorrow.)  Will check on pt tomorrow. Already up in chair.  Pt states she is not interested in PT. Family member reports she was only doing bed to wheelchair transfers and is non-ambulatory ay [facility].  Will f/u tomorrow.   Melvern Banker 07/21/2017, 3:06 PM  Lavonia Dana, [place] 07/21/2017

## 2017-07-22 LAB — CBC WITH DIFFERENTIAL/PLATELET
Basophils Absolute: 0 10*3/uL (ref 0.0–0.1)
Basophils Relative: 0 %
EOS ABS: 0.4 10*3/uL (ref 0.0–0.7)
EOS PCT: 4 %
HCT: 27.4 % — ABNORMAL LOW (ref 36.0–46.0)
Hemoglobin: 8.7 g/dL — ABNORMAL LOW (ref 12.0–15.0)
LYMPHS ABS: 2 10*3/uL (ref 0.7–4.0)
Lymphocytes Relative: 20 %
MCH: 32.2 pg (ref 26.0–34.0)
MCHC: 31.8 g/dL (ref 30.0–36.0)
MCV: 101.5 fL — ABNORMAL HIGH (ref 78.0–100.0)
MONO ABS: 1 10*3/uL (ref 0.1–1.0)
Monocytes Relative: 10 %
Neutro Abs: 6.2 10*3/uL (ref 1.7–7.7)
Neutrophils Relative %: 66 %
Platelets: 255 10*3/uL (ref 150–400)
RBC: 2.7 MIL/uL — ABNORMAL LOW (ref 3.87–5.11)
RDW: 14.5 % (ref 11.5–15.5)
WBC: 9.6 10*3/uL (ref 4.0–10.5)

## 2017-07-22 LAB — RENAL FUNCTION PANEL
ANION GAP: 11 (ref 5–15)
Albumin: 3.3 g/dL — ABNORMAL LOW (ref 3.5–5.0)
BUN: 40 mg/dL — ABNORMAL HIGH (ref 6–20)
CALCIUM: 9.6 mg/dL (ref 8.9–10.3)
CHLORIDE: 94 mmol/L — AB (ref 101–111)
CO2: 32 mmol/L (ref 22–32)
CREATININE: 2.8 mg/dL — AB (ref 0.44–1.00)
GFR calc Af Amer: 17 mL/min — ABNORMAL LOW (ref >60)
GFR calc non Af Amer: 14 mL/min — ABNORMAL LOW (ref >60)
Glucose, Bld: 175 mg/dL — ABNORMAL HIGH (ref 65–99)
Phosphorus: 4.5 mg/dL (ref 2.5–4.6)
Potassium: 4.1 mmol/L (ref 3.5–5.1)
SODIUM: 137 mmol/L (ref 135–145)

## 2017-07-22 LAB — GLUCOSE, CAPILLARY
Glucose-Capillary: 94 mg/dL (ref 65–99)
Glucose-Capillary: 137 mg/dL — ABNORMAL HIGH (ref 65–99)
Glucose-Capillary: 98 mg/dL (ref 65–99)
Glucose-Capillary: 191 mg/dL — ABNORMAL HIGH (ref 65–99)

## 2017-07-22 LAB — MAGNESIUM: Magnesium: 2.2 mg/dL (ref 1.7–2.4)

## 2017-07-22 MED ORDER — FUROSEMIDE 10 MG/ML IJ SOLN
60.0000 mg | Freq: Three times a day (TID) | INTRAMUSCULAR | Status: DC
Start: 2017-07-22 — End: 2017-07-23
  Administered 2017-07-22 – 2017-07-23 (×2): 60 mg via INTRAVENOUS
  Filled 2017-07-22 (×3): qty 6

## 2017-07-22 NOTE — Progress Notes (Signed)
OT Cancellation Note  Patient Details Name: NAILA ELIZONDO MRN: [id number] DOB: 1929-02-18   Cancelled Treatment:    Reason Eval/Treat Not Completed: Other (comment)  Pt does not want therapy- states she has enough therapy. Will sign off Thanks, Kari Baars, Chaplin Payton Mccallum D 07/22/2017, 1:31 PM

## 2017-07-22 NOTE — Progress Notes (Signed)
Admit: 07/19/2017 LOS: 3  27F with likely progressive CKD and volume overload  Subjective:  Good response to IV lasix; fairly stable SCr this AM Weights down ? 14lb from admission Denies SOB  03/10 0701 - 03/11 0700 In: 720 [P.O.:720] Out: 2050 [Urine:2050]  Filed Weights   07/20/17 0856 07/21/17 0535 07/22/17 0437  Weight: 107.3 kg (236 lb 9.6 oz) 58.4 kg (128 lb 12 oz) 56.5 kg (124 lb 9 oz)    Scheduled Meds: . allopurinol  150 mg Oral QHS  . bisoprolol  10 mg Oral Daily  . cholecalciferol  8,000 Units Oral Daily  . divalproex  375 mg Oral QHS  . ferrous sulfate  325 mg Oral Q breakfast  . FLUoxetine  20 mg Oral Daily  . fluticasone  1 spray Each Nare Daily  . furosemide  60 mg Intravenous Q8H  . gabapentin  300 mg Oral QHS  . heparin  5,000 Units Subcutaneous Q8H  . hydrALAZINE  25 mg Oral QID  . insulin aspart  0-5 Units Subcutaneous QHS  . insulin aspart  0-9 Units Subcutaneous TID WC  . insulin glargine  20 Units Subcutaneous Q12H  . magnesium oxide  400 mg Oral Daily  . olopatadine  1 drop Both Eyes BID  . pantoprazole  20 mg Oral Daily  . rOPINIRole  1 mg Oral BID  . simethicone  160 mg Oral QID  . simvastatin  20 mg Oral QHS  . traZODone  50 mg Oral Once  . vitamin B-12  1,000 mcg Oral Daily   Continuous Infusions: PRN Meds:.naphazoline-glycerin, promethazine, sodium chloride  Current Labs: reviewed    Physical Exam:  Blood pressure (!) 145/45, pulse 65, temperature 98.3 F (36.8 C), temperature source Oral, resp. rate 18, height 5' (1.524 m), weight 56.5 kg (124 lb 9 oz), SpO2 93 %. NAD, sitting in chair, pleasant L sided paresis No LEE Nl wob, ctab RRR w/o mgr No rashes/lesions EOMI  Renal US showed 9.7 cm kidneys with moderate cortical thinning bilat.  CXR - small bilat effusion, no edema ECHO - G2DD, EF 65% UA > negative UNa 63 , UCr 21, urine protein 6 mg/dL     A 1. AoCKD vs progressive CKD (I favor latter) with hypervolemia  responsitive to lasix 2. HTN, not controlled, holding home ARB 3. DM 4. Gout 5. SNF resident 6. Hx/o CVA and L hemiparesis 7. MDD  P 1. Cont IV lasix for 24h more hours; likely can transition to PO Torsemide tomorrow 2. Daily weights, Daily Renal Panel, Strict I/Os, Avoid nephrotoxins (NSAIDs, judicious IV Contrast) 3. Wll cont to follow   Pearson Grippe MD 07/22/2017, 1:19 PM  Recent Labs  Lab 07/19/17 1012 07/20/17 0557 07/21/17 0601 07/22/17 0924  NA 137 140 138 137  K 4.7 4.3 4.4 4.1  CL 100* 99* 96* 94*  CO2 27 32 36* 32  GLUCOSE 113* 73 110* 175*  BUN 33* 29* 35* 40*  CREATININE 2.59* 2.33* 2.72* 2.80*  CALCIUM 9.4 9.7 9.6 9.6  PHOS 4.5  --   --  4.5   Recent Labs  Lab 07/19/17 1012 07/20/17 0557 07/21/17 0601 07/22/17 0924  WBC 8.8 7.3 6.7 9.6  NEUTROABS 5.6  --   --  6.2  HGB 8.7* 8.9* 8.6* 8.7*  HCT 27.6* 29.0* 27.5* 27.4*  MCV 103.0* 102.5* 103.4* 101.5*  PLT 230 237 252 255

## 2017-07-22 NOTE — Care Management Important Message (Signed)
Important Message  Patient Details  Name: [name] MRN: [id number] Date of Birth: 1928-12-17   Medicare Important Message Given:  Yes    Kerin Salen 07/22/2017, 11:51 AMImportant Message  Patient Details  Name: [name] MRN: [id number] Date of Birth: August 20, 1928   Medicare Important Message Given:  Yes    Kerin Salen 07/22/2017, 11:51 AM

## 2017-07-22 NOTE — Progress Notes (Signed)
PROGRESS NOTE    [name]  QMV:[id number] DOB: [birth year] DOA: 07/19/2017 PCP: Josetta Huddle, MD   Brief Narrative:  Patient is an 84 year old Caucasian female, very poor historian, assisted living facility resident, with a past medical history significant for CVA, chronic kidney disease, hypertension, pneumonia, diabetes mellitus, gout, falls, GI bleed and history of cancer status post irradiation.  The patient could only tell me that she was intermittently short of breath, with O2 sat in the 80s, and has had swelling involving the legs and the hands.  Patient also reported about gaseous distention of the abdomen.  His CT scan of the abdomen and pelvis is nonrevealing.  It only revealed cholelithiasis.  Chest x-ray revealed bilateral mild pleural effusion.  Patient denies headache, no neck pain, no chest pain, no fever or chills, no cough, no urinary symptoms.  The patient is also said to be progressively weak.  Workup done revealed serum creatinine of 2.59, up from 0.86 about 2-3 years ago.  There is no history of NSAID use.  ED Course: CT of the abdomen done in the ER revealed cholelithiasis.  UA is nonrevealing.  BMP reveals sodium of 137, potassium of 4.7, CO2 27, BUN of 33 with creatinine of 2.59 blood sugar of 113.  Cardiac BNP is 282.5.  Troponin is less than 0.03.  CBC reveals WBC of 8.8, hemoglobin of 8.7, hematocrit of 27.6, MCV of 103 with platelet count of 230     Assessment & Plan:   Principal Problem:   AKI (acute kidney injury) ([place]) Active Problems:   Hypertension   Diabetes type 2, uncontrolled ([place])   Edema   Shortness of breath   Gastroesophageal reflux disease   Hyperlipidemia  1 acute renal failure versus progressive chronic kidney disease Patient presented with acute renal failure with creatinine of 2.59 on admission.  Last creatinine noted was 0.86 August 01, 2013.  Patient presented with volume overload as she was noted to have lower extremity edema, increased  abdominal distention/girth.  Urinalysis negative for protein.  Urine protein was 6.  Urine sodium 63, urine creatinine of 20.18.  Renal ultrasound with increased echotexture and cortical thinning bilaterally compatible with chronic medical renal disease, negative for hydronephrosis.  Renal ultrasound is a change from 07/22/2013.  Patient with a urine output of 2050 cc over the past 24 hours.  Renal function with no significant change over the past 24 hours creatinine currently at 2.80 likely going to be patient's new baseline.  Patient has been seen in consultation by nephrology and patient placed back on IV Lasix.  Nephrology recommending continuing IV Lasix for another 24 hours and then transitioning to oral torsemide 40 mg daily.  Appreciate nephrology input and recommendations.    2.  Shortness of breath/lower extremity edema/hypervolemia Questionable etiology.  Likely secondary to progressive chronic kidney disease.  Patient placed on IV Lasix with urine output of 2050 cc over the past 24 hours and clinical improvement.  2D echo with EF of 65-70%, no wall motion abnormalities, grade 2 diastolic dysfunction.  Cardiac enzymes negative x3.  Doubt if lower extremity edema is cardiac in etiology.  Patient was seen in consultation by nephrology and patient placed on Lasix IV 60 mg every 8 hours with good urine output and response.   3.  Hypertension Stable.  Patient's Norvasc was discontinued secondary to lower extremity edema.  Continue Zebeta and hydralazine.  Patient was seen by nephrology in consultation and patient placed on IV Lasix with good diuresis.  Follow for now.    4.  Diabetes mellitus type 2 Hemoglobin A1c was 8.0 on 07/21/2013.  Repeat hemoglobin A1c 6.5 on 07/21/2017.  CBGs are ranging from 94-137.  Continue current regimen of Lantus and sliding scale insulin and gabapentin.   5.  Gastroesophageal reflux disease Continue current regimen of PPI and simethicone.   6.  Hyperlipidemia Fasting  lipid panel with a total cholesterol 124, LDL of 23, triglycerides of 366.  Continue statin.  Outpatient follow-up.   7.  Gout Continue allopurinol.    DVT prophylaxis: Heparin Code Status: Full Family Communication: Updated patient.  No family at bedside. Disposition Plan: Home when renal function improved and back to baseline, clinical improvement and per PT evaluation.   Consultants:   Nephrology: Dr. Jonnie Finner 07/21/2017  Procedures:   2D echo 07/20/2017  Renal ultrasound 07/20/2017  Antimicrobials:   None   Subjective: Patient states shortness of breath improved.  Denies chest pain.  Asking when she can go home.    Objective: Vitals:   07/22/17 0437 07/22/17 0501 07/22/17 0950 07/22/17 1359  BP: (!) 153/51  (!) 145/45 (!) 161/49  Pulse: 65   65  Resp: 18     Temp: 98.3 F (36.8 C)   98.6 F (37 C)  TempSrc: Oral   Oral  SpO2: (!) 87% 93%  99%  Weight: 56.5 kg (124 lb 9 oz)     Height:        Intake/Output Summary (Last 24 hours) at 07/22/2017 1902 Last data filed at 07/22/2017 0800 Gross per 24 hour  Intake 240 ml  Output 900 ml  Net -660 ml   Filed Weights   07/20/17 0856 07/21/17 0535 07/22/17 0437  Weight: 107.3 kg (236 lb 9.6 oz) 58.4 kg (128 lb 12 oz) 56.5 kg (124 lb 9 oz)    Examination:  General exam: Sitting in chair. Respiratory system: Lungs clear to auscultation bilaterally.  No crackles, no wheezes, no rhonchi.  Cardiovascular system: RRR no murmurs, rubs, gallops.  No lower extremity edema.  Gastrointestinal system: Abdomen is tender, nondistended, soft, positive bowel sounds.  No hepatosplenomegaly.  No guarding.  No rebound.  Central nervous system: Alert and oriented.  Moving extremities spontaneously.  No focal neurological deficits. Extremities: Symmetric 5 x 5 power. Skin: No rashes, lesions or ulcers Psychiatry: Judgement and insight appear normal. Mood & affect appropriate.     Data Reviewed: I have personally reviewed following  labs and imaging studies  CBC: Recent Labs  Lab 07/19/17 1012 07/20/17 0557 07/21/17 0601 07/22/17 0924  WBC 8.8 7.3 6.7 9.6  NEUTROABS 5.6  --   --  6.2  HGB 8.7* 8.9* 8.6* 8.7*  HCT 27.6* 29.0* 27.5* 27.4*  MCV 103.0* 102.5* 103.4* 101.5*  PLT 230 237 252 101   Basic Metabolic Panel: Recent Labs  Lab 07/19/17 1012 07/20/17 0557 07/21/17 0601 07/22/17 0924  NA 137 140 138 137  K 4.7 4.3 4.4 4.1  CL 100* 99* 96* 94*  CO2 27 32 36* 32  GLUCOSE 113* 73 110* 175*  BUN 33* 29* 35* 40*  CREATININE 2.59* 2.33* 2.72* 2.80*  CALCIUM 9.4 9.7 9.6 9.6  MG 2.4  --  2.3 2.2  PHOS 4.5  --   --  4.5   GFR: Estimated Creatinine Clearance: 11.4 mL/min (A) (by C-G formula based on SCr of 2.8 mg/dL (H)). Liver Function Tests: Recent Labs  Lab 07/19/17 1012 07/20/17 0557 07/22/17 0924  AST 14* 12*  --  ALT 8* 8*  --   ALKPHOS 35* 36*  --   BILITOT 0.5 <0.1*  --   PROT 6.6 6.8  --   ALBUMIN 3.4* 3.5 3.3*   No results for input(s): LIPASE, AMYLASE in the last 168 hours. No results for input(s): AMMONIA in the last 168 hours. Coagulation Profile: No results for input(s): INR, PROTIME in the last 168 hours. Cardiac Enzymes: Recent Labs  Lab 07/19/17 1012 07/20/17 0925 07/20/17 1421 07/20/17 2029  TROPONINI <0.03 <0.03 <0.03 <0.03   BNP (last 3 results) No results for input(s): PROBNP in the last 8760 hours. HbA1C: Recent Labs    07/20/17 0557 07/21/17 0601  HGBA1C 6.5* 6.5*   CBG: Recent Labs  Lab 07/21/17 1704 07/21/17 2045 07/22/17 0726 07/22/17 1137 07/22/17 1639  GLUCAP 149* 135* 94 137* 98   Lipid Profile: Recent Labs    07/21/17 0601  CHOL 124  HDL 28*  LDLCALC 23  TRIG 366*  CHOLHDL 4.4   Thyroid Function Tests: No results for input(s): TSH, T4TOTAL, FREET4, T3FREE, THYROIDAB in the last 72 hours. Anemia Panel: Recent Labs    07/20/17 0557 07/20/17 1421  VITAMINB12  --  1,311*  FOLATE  --  17.4  FERRITIN  --  87  TIBC  --  309    IRON  --  32  RETICCTPCT 2.4  --    Sepsis Labs: No results for input(s): PROCALCITON, LATICACIDVEN in the last 168 hours.  Recent Results (from the past 240 hour(s))  MRSA PCR Screening     Status: None   Collection Time: 07/19/17  5:43 PM  Result Value Ref Range Status   MRSA by PCR NEGATIVE NEGATIVE Final    Comment:        The GeneXpert MRSA Assay (FDA approved for NASAL specimens only), is one component of a comprehensive MRSA colonization surveillance program. It is not intended to diagnose MRSA infection nor to guide or monitor treatment for MRSA infections. Performed at [facility], [place] [address] [place], [place] [address]          Radiology Studies: No results found.      Scheduled Meds: . allopurinol  150 mg Oral QHS  . bisoprolol  10 mg Oral Daily  . cholecalciferol  8,000 Units Oral Daily  . divalproex  375 mg Oral QHS  . ferrous sulfate  325 mg Oral Q breakfast  . FLUoxetine  20 mg Oral Daily  . fluticasone  1 spray Each Nare Daily  . gabapentin  300 mg Oral QHS  . heparin  5,000 Units Subcutaneous Q8H  . hydrALAZINE  25 mg Oral QID  . insulin aspart  0-5 Units Subcutaneous QHS  . insulin aspart  0-9 Units Subcutaneous TID WC  . insulin glargine  20 Units Subcutaneous Q12H  . magnesium oxide  400 mg Oral Daily  . olopatadine  1 drop Both Eyes BID  . pantoprazole  20 mg Oral Daily  . rOPINIRole  1 mg Oral BID  . simethicone  160 mg Oral QID  . simvastatin  20 mg Oral QHS  . traZODone  50 mg Oral Once  . vitamin B-12  1,000 mcg Oral Daily   Continuous Infusions:   LOS: 3 days    Time spent: 35 minutes    Irine Seal, MD [facility] Pager [phone]  If 7PM-7AM, please contact night-coverage [email or web address] Password [facility] 07/22/2017, 7:02 PM

## 2017-07-22 NOTE — Progress Notes (Signed)
PT Cancellation Note  Patient Details Name: [name] MRN: [id number] DOB: 09/22/1928   Cancelled Treatment:    Reason Eval/Treat Not Completed: Attempted PT eval this a.m.Marland Kitchen Pt declines therapy services-"I don't want anymore therapy." Will sign off at pt's request. She stated she plans to return to her ALF where they assist her as needed.    Weston Anna, MPT Pager: [phone]

## 2017-07-23 DIAGNOSIS — N184 Chronic kidney disease, stage 4 (severe): Secondary | ICD-10-CM

## 2017-07-23 LAB — GLUCOSE, CAPILLARY
Glucose-Capillary: 99 mg/dL (ref 65–99)
Glucose-Capillary: 163 mg/dL — ABNORMAL HIGH (ref 65–99)
Glucose-Capillary: 144 mg/dL — ABNORMAL HIGH (ref 65–99)

## 2017-07-23 LAB — RENAL FUNCTION PANEL
Albumin: 3.4 g/dL — ABNORMAL LOW (ref 3.5–5.0)
Anion gap: 13 (ref 5–15)
BUN: 46 mg/dL — ABNORMAL HIGH (ref 6–20)
CHLORIDE: 90 mmol/L — AB (ref 101–111)
CO2: 33 mmol/L — AB (ref 22–32)
CREATININE: 2.78 mg/dL — AB (ref 0.44–1.00)
Calcium: 9.7 mg/dL (ref 8.9–10.3)
GFR calc non Af Amer: 14 mL/min — ABNORMAL LOW (ref >60)
GFR, EST AFRICAN AMERICAN: 17 mL/min — AB (ref >60)
Glucose, Bld: 87 mg/dL (ref 65–99)
POTASSIUM: 4 mmol/L (ref 3.5–5.1)
Phosphorus: 4.6 mg/dL (ref 2.5–4.6)
Sodium: 136 mmol/L (ref 135–145)

## 2017-07-23 MED ORDER — TORSEMIDE 20 MG PO TABS
40.0000 mg | ORAL_TABLET | Freq: Every day | ORAL | Status: DC
  Administered 2017-07-23: 40 mg via ORAL
  Filled 2017-07-23: qty 2

## 2017-07-23 MED ORDER — INSULIN GLARGINE 100 UNIT/ML SOLOSTAR PEN: 40.0000 [IU] | PEN_INJECTOR | SUBCUTANEOUS | 0 refills | Status: DC

## 2017-07-23 MED ORDER — TRAMADOL HCL 50 MG PO TABS: 50.0000 mg | ORAL_TABLET | Freq: Two times a day (BID) | ORAL | 0 refills | Status: DC

## 2017-07-23 MED ORDER — TORSEMIDE 20 MG PO TABS: 40.0000 mg | ORAL_TABLET | Freq: Every day | ORAL | 0 refills | Status: AC

## 2017-07-23 NOTE — Progress Notes (Addendum)
Patient ready to discharge back to [facility].  CSW confirmed with Willette(admission coordinator) they have received FL2 and will readmit. Med. Nes. Complete.  [facility] to transport.     Nurse call report to: [phone] Room 317A  5:21pm D/C Summary sent.   Kathrin Greathouse, Latanya Presser, MSW Clinical Social Worker  [phone] 07/23/2017  5:08 PM

## 2017-07-23 NOTE — Progress Notes (Signed)
Admit: 07/19/2017 LOS: 4  18F with likely progressive CKD and volume overload  Subjective:  Now on torsemide, last lasix IV this AM Stable labs this AM Weight down 17lb Feels well Says would never do dialysis  03/11 0701 - 03/12 0700 In: 240 [P.O.:240] Out: 750 [Urine:750]  Filed Weights   07/21/17 0535 07/22/17 0437 07/23/17 0504  Weight: 58.4 kg (128 lb 12 oz) 56.5 kg (124 lb 9 oz) 55.2 kg (121 lb 11.1 oz)    Scheduled Meds: . allopurinol  150 mg Oral QHS  . bisoprolol  10 mg Oral Daily  . cholecalciferol  8,000 Units Oral Daily  . divalproex  375 mg Oral QHS  . ferrous sulfate  325 mg Oral Q breakfast  . FLUoxetine  20 mg Oral Daily  . fluticasone  1 spray Each Nare Daily  . gabapentin  300 mg Oral QHS  . heparin  5,000 Units Subcutaneous Q8H  . hydrALAZINE  25 mg Oral QID  . insulin aspart  0-5 Units Subcutaneous QHS  . insulin aspart  0-9 Units Subcutaneous TID WC  . insulin glargine  20 Units Subcutaneous Q12H  . magnesium oxide  400 mg Oral Daily  . olopatadine  1 drop Both Eyes BID  . pantoprazole  20 mg Oral Daily  . rOPINIRole  1 mg Oral BID  . simvastatin  20 mg Oral QHS  . torsemide  40 mg Oral Daily  . traZODone  50 mg Oral Once  . vitamin B-12  1,000 mcg Oral Daily   Continuous Infusions: PRN Meds:.naphazoline-glycerin, promethazine, sodium chloride  Current Labs: reviewed    Physical Exam:  Blood pressure (!) 141/43, pulse (!) 58, temperature 98.4 F (36.9 C), temperature source Oral, resp. rate 16, height 5' (1.524 m), weight 55.2 kg (121 lb 11.1 oz), SpO2 96 %. NAD, sitting in chair, pleasant L sided paresis No LEE Nl wob, ctab RRR w/o mgr No rashes/lesions EOMI  Renal US showed 9.7 cm kidneys with moderate cortical thinning bilat.  CXR - small bilat effusion, no edema ECHO - G2DD, EF 65% UA > negative UNa 63 , UCr 21, urine protein 6 mg/dL   A 1. AoCKD vs progressive CKD (I favor latter) with hypervolemia responsitive to  lasix 2. HTN, improved for now 3. DM 4. Gout 5. SNF resident 6. Hx/o CVA and L hemiparesis 7. MDD  P 1. OK with DC on torsemide 40mg  PO daily 2. HOld ARB and CCB at DC, can resume as outpt if needed; still on hydralazine and bb 3. Will arrange outpt f/u and labs with me   Pearson Grippe MD 07/23/2017, 4:40 PM  Recent Labs  Lab 07/19/17 1012  07/21/17 0601 07/22/17 0924 07/23/17 0623  NA 137   < > 138 137 136  K 4.7   < > 4.4 4.1 4.0  CL 100*   < > 96* 94* 90*  CO2 27   < > 36* 32 33*  GLUCOSE 113*   < > 110* 175* 87  BUN 33*   < > 35* 40* 46*  CREATININE 2.59*   < > 2.72* 2.80* 2.78*  CALCIUM 9.4   < > 9.6 9.6 9.7  PHOS 4.5  --   --  4.5 4.6   < > = values in this interval not displayed.   Recent Labs  Lab 07/19/17 1012 07/20/17 0557 07/21/17 0601 07/22/17 0924  WBC 8.8 7.3 6.7 9.6  NEUTROABS 5.6  --   --  6.2  HGB 8.7* 8.9* 8.6* 8.7*  HCT 27.6* 29.0* 27.5* 27.4*  MCV 103.0* 102.5* 103.4* 101.5*  PLT 230 237 252 255

## 2017-07-23 NOTE — Progress Notes (Signed)
Patient discharged to [facility] in stable condition; transported via [facility].

## 2017-07-23 NOTE — Clinical Social Work Note (Signed)
Clinical Social Work Assessment  Patient Details  Name: [name] MRN: [id number] Date of Birth: June 09, 1928  Date of referral:  07/23/17               Reason for consult:  (admitted from facility)                Permission sought to share information with:  Family Supports, Customer service manager Permission granted to share information::  Yes, Verbal Permission Granted  Name::     daughter [name]  Agency::  [facility]  Relationship::     Contact Information:     Housing/Transportation Living arrangements for the past 2 months:  [place] of Information:  Patient, Adult Children, Facility Patient Interpreter Needed:  None Criminal Activity/Legal Involvement Pertinent to Current Situation/Hospitalization:  No - Comment as needed Significant Relationships:  Adult Children, Community Support Lives with:  Facility Resident Do you feel safe going back to the place where you live?  Yes Need for family participation in patient care:  Yes (Comment)(pt asked daughter to be involved)  Care giving concerns:  Pt lives at [facility] assisted [facility]- has for several years and very pleased with care.  At baseline does transfer independently (to wheelchair) but otherwise non-ambulatory. Eats independently but needs assistance with most other ADLs. Reports she is not interested in getting any further PT/OT at this time, has had extensively in the past. Oriented x 4 at baseline.  Currently admitted inpatient for work up of renal failure and SOB. Pt's ALF reports she started on Home O2 about 2 days prior to hospital admission and notes that ALF has O2 supplies there available if pt needs it at DC. Would need home O2 orders on DC summary and FL2  Facilities manager / plan:  CSW consulted to assist with disposition as pt is admitted from facility- [facility]. Both pt and daughter report that plan at DC  will be to transition back to [facility]. CSW spoke with Lattie Haw at [facility]- aware of pt's status and will need updated FL2 and DC summary at DC (note above needs if pt needing home O2 at DC).  Plan: return to [facility] at DC.   Employment status:  Retired Forensic scientist:  Medicare PT Recommendations:  No Follow Up(pt declined any therapy) Information / Referral to community resources:     Patient/Family's Response to care:  Appreciative and engaged  Patient/Family's Understanding of and Emotional Response to Diagnosis, Current Treatment, and Prognosis:  CSW's conversation with pt and daughter about pt's current treatment was brief as pt having care done, however both showed adequate understanding of status and plan.  Were good historians of her care.  Emotionally seemed pleasant and stable- daughter notes that pt "has been very frustrated with being in the hospital."  Emotional Assessment Appearance:  Appears stated age Attitude/Demeanor/Rapport:  Engaged Affect (typically observed):  Calm Orientation:  Oriented to Self, Oriented to Place, Oriented to  Time, Oriented to Situation Alcohol / Substance use:    Psych involvement (Current and /or in the community):  No (Comment)  Discharge Needs  Concerns to be addressed:  Care Coordination Readmission within the last 30 days:  No Current discharge risk:  None Barriers to Discharge:  No Barriers Identified   Nila Nephew, LCSW 07/23/2017, 11:16 AM [phone]

## 2017-07-23 NOTE — Progress Notes (Signed)
RN called attempted report to SNF multiple times, no answer. Kizzie Ide, RN

## 2017-07-23 NOTE — Discharge Summary (Signed)
Physician Discharge Summary  [name]:[id number] DOB: 1929/01/27 DOA: 07/19/2017  PCP: Josetta Huddle, MD  Admit date: 07/19/2017 Discharge date: 07/23/2017  Time spent: 55 minutes  Recommendations for Outpatient Follow-up:  1. Follow up with MD at assisted living facility.  Patient blood pressure needs to be reassessed on follow-up as patient's Norvasc and ARB were discontinued during this hospitalization. 2. Follow-up with Dr. Joelyn Oms, nephrology.  Office will call with appointment time.  On follow-up patient will need a renal function panel done to follow-up on electrolytes and renal function.   Discharge Diagnoses:  Principal Problem:   CKD (chronic kidney disease), stage IV (HCC) Active Problems:   AKI (acute kidney injury) ([place])   Hypertension   Diabetes type 2, uncontrolled (HCC)   Edema   Shortness of breath   Gastroesophageal reflux disease   Hyperlipidemia   Discharge Condition: Stable and improved  Diet recommendation: Heart healthy  Filed Weights   07/21/17 0535 07/22/17 0437 07/23/17 0504  Weight: 58.4 kg (128 lb 12 oz) 56.5 kg (124 lb 9 oz) 55.2 kg (121 lb 11.1 oz)    History of present illness:  Per Dr [name] Patient is an 84 year old Caucasian female, very poor historian, assisted living facility resident, with a past medical history significant for CVA, chronic kidney disease, hypertension, pneumonia, diabetes mellitus, gout, falls, GI bleed and history of cancer status post irradiation.  The patient could only state that she was intermittently short of breath, with O2 sat in the 80s, and has had swelling involving the legs and the hands.  Patient also reported about gaseous distention of the abdomen.  His CT scan of the abdomen and pelvis was nonrevealing.  It only revealed cholelithiasis.  Chest x-ray revealed bilateral mild pleural effusion.  Patient denied headache, no neck pain, no chest pain, no fever or chills, no cough, no urinary symptoms.  The  patient was also said to be progressively weak.  Workup done revealed serum creatinine of 2.59, up from 0.86 about 2-3 years ago.  There was no history of NSAID use.  ED Course: CT of the abdomen done in the ER revealed cholelithiasis.  UA was nonrevealing.  BMP reveals sodium of 137, potassium of 4.7, CO2 27, BUN of 33 with creatinine of 2.59 blood sugar of 113.  Cardiac BNP is 282.5.  Troponin is less than 0.03.  CBC reveals WBC of 8.8, hemoglobin of 8.7, hematocrit of 27.6, MCV of 103 with platelet count of 230 Pertinent labs: As above EKG: Independently reviewed.  Imaging: independently reviewed.     Hospital Course:  1 acute renal failure versus progressive chronic kidney disease stage III/IV Patient presented with acute renal failure with creatinine of 2.59 on admission.  Last creatinine noted was 0.86 August 01, 2013.  Patient presented with volume overload as she was noted to have lower extremity edema, increased abdominal distention/girth.  Urinalysis negative for protein.  Urine protein was 6.  Urine sodium 63, urine creatinine of 20.18.  Renal ultrasound with increased echotexture and cortical thinning bilaterally compatible with chronic medical renal disease, negative for hydronephrosis.  Renal ultrasound is a change from 07/22/2013.  Patient was initially placed on IV Lasix and nephrology consulted.  Patient with good urine output during the hospitalization was -2.890 L throughout this hospitalization.  Patient's renal function fluctuated however seem to have plateaued and stabilized at 2.78 by day of discharge.  Patient was followed by nephrology throughout the hospitalization and it was felt that patient likely had progressive  chronic kidney disease.  Patient was transitioned from IV Lasix to oral torsemide 40 mg daily which patient will be discharged home on.  Patient will follow up with nephrology in the outpatient setting for lab work and further management.   2.  Shortness of  breath/lower extremity edema/hypervolemia Questionable etiology.  Likely secondary to progressive chronic kidney disease.  Patient placed on IV Lasix during the hospitalization and was -2.890 L.  2D echo with EF of 65-70%, no wall motion abnormalities, grade 2 diastolic dysfunction.  Cardiac enzymes negative x3.  Doubt if lower extremity edema is cardiac in etiology.  Patient was seen in consultation by nephrology and patient placed on IV diuretics with good urine output.  Patient's renal function stabilized at 2.78 by day of discharge.  Patient's lower extremity edema, shortness of breath and hypervolemia had resolved by day of discharge.  Outpatient follow-up.   3.  Hypertension Stable.  Patient's Norvasc was discontinued secondary to lower extremity edema.    Patient was maintained on home regimen of the beta and hydralazine.  Patient's ARB was also held due to worsening renal function.  Patient blood pressure remained stable.  Patient was on IV diuretics during the hospitalization and transition to oral torsemide 40 mg daily.  Patient will be discharged on torsemide, Zebeta and hydralazine.    4.  Diabetes mellitus type 2 Hemoglobin A1c was 8.0 on 07/21/2013.  Repeat hemoglobin A1c 6.5 on 07/21/2017.  CBGs remained well controlled during the hospitalization.  Patient's home dose Lantus was decreased to 40 units daily.  Patient was maintained on sliding scale insulin.    5.  Gastroesophageal reflux disease Patient placed on a PPI and simethicone.  Outpatient follow-up.    6.  Hyperlipidemia Fasting lipid panel with a total cholesterol 124, LDL of 23, triglycerides of 366.    Patient maintained on statin.  Outpatient follow-up.   7.  Gout Continued on home regimen of allopurinol.     Procedures:  2D echo 07/20/2017  Renal ultrasound 07/20/2017      Consultations:  Nephrology: Dr. Jonnie Finner 07/21/2017      Discharge Exam: Vitals:   07/23/17 1305 07/23/17 1350  BP: (!) 151/56 (!)  141/43  Pulse: (!) 59 (!) 58  Resp: 18 16  Temp: 98.4 F (36.9 C) 98.4 F (36.9 C)  SpO2: 100% 96%    General: NAD Cardiovascular: RRR Respiratory: CTAB  Discharge Instructions   Discharge Instructions    Diet - low sodium heart healthy   Complete by:  As directed    Increase activity slowly   Complete by:  As directed      Allergies as of 07/23/2017      Reactions   Penicillins Other (See Comments)   unknown      Medication List    STOP taking these medications   amLODipine 10 MG tablet Commonly known as:  NORVASC   furosemide 20 MG tablet Commonly known as:  LASIX   losartan 100 MG tablet Commonly known as:  COZAAR   NOVOLOG FLEXPEN 100 UNIT/ML FlexPen Generic drug:  insulin aspart     TAKE these medications   acetaminophen 325 MG tablet Commonly known as:  TYLENOL Take 650 mg by mouth every 8 (eight) hours as needed for moderate pain.   allopurinol 300 MG tablet Commonly known as:  ZYLOPRIM Take 150 mg by mouth at bedtime.   alum & mag hydroxide-simeth 200-200-20 MG/5ML suspension Commonly known as:  MAALOX/MYLANTA Take 30 mLs by mouth every  4 (four) hours as needed for indigestion or heartburn.   bisoprolol 10 MG tablet Commonly known as:  ZEBETA Take 1 tablet (10 mg total) by mouth daily.   DEEP SEA NASAL SPRAY 0.65 % nasal spray Generic drug:  sodium chloride Place 1 spray into the nose 4 (four) times daily as needed for congestion.   divalproex 125 MG capsule Commonly known as:  DEPAKOTE SPRINKLE Take 375 mg by mouth at bedtime.   ferrous sulfate 325 (65 FE) MG tablet Take 325 mg by mouth daily with breakfast.   FLUoxetine 20 MG capsule Commonly known as:  PROZAC Take 20 mg by mouth every morning.   fluticasone 50 MCG/ACT nasal spray Commonly known as:  FLONASE Place 1 spray into both nostrils daily.   gabapentin 400 MG capsule Commonly known as:  NEURONTIN Take 400 mg by mouth at bedtime.   HM VITAMIN D3 4000 units  Caps Generic drug:  Cholecalciferol Take 8,000 Units by mouth daily.   hydrALAZINE 25 MG tablet Commonly known as:  APRESOLINE Take 25 mg by mouth 4 (four) times daily.   Insulin Glargine 100 UNIT/ML Solostar Pen Commonly known as:  LANTUS SOLOSTAR Inject 40 Units into the skin every morning. What changed:    how much to take  when to take this   linagliptin 5 MG Tabs tablet Commonly known as:  TRADJENTA Take 5 mg by mouth daily.   magnesium oxide 400 MG tablet Commonly known as:  MAG-OX Take 400 mg by mouth daily.   Olopatadine HCl 0.2 % Soln Place 1 drop into both eyes daily.   pantoprazole 20 MG tablet Commonly known as:  PROTONIX Take 20 mg by mouth daily.   promethazine 25 MG suppository Commonly known as:  PHENERGAN Place 25 mg rectally every 6 (six) hours as needed for nausea. What changed:  Another medication with the same name was removed. Continue taking this medication, and follow the directions you see here.   rOPINIRole 1 MG tablet Commonly known as:  REQUIP Take 1 mg by mouth 2 (two) times daily.   simethicone 80 MG chewable tablet Commonly known as:  MYLICON Chew 80 mg by mouth 3 (three) times daily as needed for flatulence (and bloating).   simvastatin 20 MG tablet Commonly known as:  ZOCOR Take 20 mg by mouth at bedtime.   tetrahydrozoline 0.05 % ophthalmic solution Place 2 drops into both eyes 2 (two) times daily.   torsemide 20 MG tablet Commonly known as:  DEMADEX Take 2 tablets (40 mg total) by mouth daily.   traMADol 50 MG tablet Commonly known as:  ULTRAM Take 1 tablet (50 mg total) by mouth 2 (two) times daily. 1/2 tablet twice daily as needed for pain, and 1 tablet at bedtime   traZODone 50 MG tablet Commonly known as:  DESYREL Take 50 mg by mouth at bedtime.   VITAMIN B 12 PO Take 1 tablet by mouth daily.      Allergies  Allergen Reactions  . Penicillins Other (See Comments)    unknown    Contact information for  follow-up providers    Rexene Agent, MD Follow up.   Specialty:  Nephrology Why:  Office will call with outpatient follow-up appointment. Contact information: [place] [address] [phone]            Contact information for after-discharge care    Destination    [facility] Follow up.   Service:  Assisted Living Contact  information: [place] [address] [place] [phone]                   The results of significant diagnostics from this hospitalization (including imaging, microbiology, ancillary and laboratory) are listed below for reference.    Significant Diagnostic Studies: Ct Abdomen Pelvis Wo Contrast  Result Date: 07/19/2017 CLINICAL DATA:  Abdominal pain and distension EXAM: CT ABDOMEN AND PELVIS WITHOUT CONTRAST TECHNIQUE: Multidetector CT imaging of the abdomen and pelvis was performed following the standard protocol without IV contrast. COMPARISON:  Plain film from earlier in the same day. FINDINGS: Lower chest: Small bilateral pleural effusions are noted. Bibasilar atelectatic changes are seen as well. Hepatobiliary: Liver is within normal limits. The gallbladder is well distended and demonstrates multiple small dependent gallstones. Pancreas: Unremarkable. No pancreatic ductal dilatation or surrounding inflammatory changes. Spleen: Normal in size without focal abnormality. Adrenals/Urinary Tract: Adrenal glands are within normal limits. Scattered renal vascular calcifications are seen. No renal calculi are noted. No obstructive changes are seen. The bladder is well distended. Stomach/Bowel: The appendix is not well visualized although no inflammatory changes are identified. Mild retained fecal material is seen. No obstructive or inflammatory changes are noted. Vascular/Lymphatic: Aortic atherosclerosis. No enlarged abdominal or pelvic lymph nodes. Reproductive: Status post hysterectomy. No adnexal  masses. Other: No abdominal wall hernia or abnormality. No abdominopelvic ascites. Musculoskeletal: Degenerative changes of lumbar spine are noted. Anterolisthesis of L4 on L5 is noted. IMPRESSION: Bilateral pleural effusions and basilar atelectatic changes. Cholelithiasis without complicating factors. No acute abnormality noted. Electronically Signed   By: Inez Catalina M.D.   On: 07/19/2017 13:26   Dg Chest 2 View  Result Date: 07/19/2017 CLINICAL DATA:  Pt states SOB, weakness, swelling of feet and legs increasing x 3 days - hx endometrial cancer 2012 - diabetic - hx hypertension -former smoker EXAM: CHEST - 2 VIEW COMPARISON:  Chest x-ray dated 05/19/2015 FINDINGS: Heart size and mediastinal contours are stable. Atherosclerotic changes noted at the aortic arch. Blunting at the costophrenic angles bilaterally. Lungs otherwise stable. No pneumothorax seen. No acute or suspicious osseous finding. IMPRESSION: 1. Small bilateral pleural effusions, new compared to previous chest x-ray of 05/19/2015. 2. No evidence of pneumonia or pulmonary edema. 3. Aortic atherosclerosis. Electronically Signed   By: Franki Cabot M.D.   On: 07/19/2017 10:47   US Renal  Result Date: 07/20/2017 CLINICAL DATA:  Acute renal failure on chronic kidney disease. EXAM: RENAL / URINARY TRACT ULTRASOUND COMPLETE COMPARISON:  CT 07/19/2016 FINDINGS: Right Kidney: Length: 9.7 cm. Increased echotexture. Cortical thinning. No mass or hydronephrosis. Left Kidney: Length: 9.9 cm. Increased echotexture and cortical thinning. No hydronephrosis. 1.4 cm upper pole cyst. Bladder: Appears normal for degree of bladder distention. IMPRESSION: Increased echotexture and cortical thinning bilaterally compatible chronic medical renal disease. No hydronephrosis. Electronically Signed   By: Rolm Baptise M.D.   On: 07/20/2017 10:23    Microbiology: Recent Results (from the past 240 hour(s))  MRSA PCR Screening     Status: None   Collection Time: 07/19/17   5:43 PM  Result Value Ref Range Status   MRSA by PCR NEGATIVE NEGATIVE Final    Comment:        The GeneXpert MRSA Assay (FDA approved for NASAL specimens only), is one component of a comprehensive MRSA colonization surveillance program. It is not intended to diagnose MRSA infection nor to guide or monitor treatment for MRSA infections. Performed at [facility], [place]  [address], [place], [place] [address]      Labs: Basic Metabolic Panel: Recent Labs  Lab 07/19/17 1012 07/20/17 0557 07/21/17 0601 07/22/17 0924 07/23/17 0623  NA 137 140 138 137 136  K 4.7 4.3 4.4 4.1 4.0  CL 100* 99* 96* 94* 90*  CO2 27 32 36* 32 33*  GLUCOSE 113* 73 110* 175* 87  BUN 33* 29* 35* 40* 46*  CREATININE 2.59* 2.33* 2.72* 2.80* 2.78*  CALCIUM 9.4 9.7 9.6 9.6 9.7  MG 2.4  --  2.3 2.2  --   PHOS 4.5  --   --  4.5 4.6   Liver Function Tests: Recent Labs  Lab 07/19/17 1012 07/20/17 0557 07/22/17 0924 07/23/17 0623  AST 14* 12*  --   --   ALT 8* 8*  --   --   ALKPHOS 35* 36*  --   --   BILITOT 0.5 <0.1*  --   --   PROT 6.6 6.8  --   --   ALBUMIN 3.4* 3.5 3.3* 3.4*   No results for input(s): LIPASE, AMYLASE in the last 168 hours. No results for input(s): AMMONIA in the last 168 hours. CBC: Recent Labs  Lab 07/19/17 1012 07/20/17 0557 07/21/17 0601 07/22/17 0924  WBC 8.8 7.3 6.7 9.6  NEUTROABS 5.6  --   --  6.2  HGB 8.7* 8.9* 8.6* 8.7*  HCT 27.6* 29.0* 27.5* 27.4*  MCV 103.0* 102.5* 103.4* 101.5*  PLT 230 237 252 255   Cardiac Enzymes: Recent Labs  Lab 07/19/17 1012 07/20/17 0925 07/20/17 1421 07/20/17 2029  TROPONINI <0.03 <0.03 <0.03 <0.03   BNP: BNP (last 3 results) Recent Labs    07/19/17 1012  BNP 282.5*    ProBNP (last 3 results) No results for input(s): PROBNP in the last 8760 hours.  CBG: Recent Labs  Lab 07/22/17 1639 07/22/17 2124 07/23/17 0729 07/23/17 1143 07/23/17 1635  GLUCAP 98 191* 99 163* 144*        Signed:  Irine Seal MD.  [facility] 07/23/2017, 5:17 PM

## 2017-07-23 NOTE — NC FL2 (Signed)
[place] LEVEL OF CARE SCREENING TOOL     IDENTIFICATION  Patient Name: [name] Birthdate: August 24, 1928 Sex: female Admission Date (Current Location): 07/19/2017  [facility] and [place] Number:  Herbalist and Address:  [facility],  [place], [place]      Provider Number: [id number]  Attending Physician Name and Address:  Eugenie Filler, MD  Relative Name and Phone Number:       Current Level of Care: Hospital Recommended Level of Care: [place] Prior Approval Number:    Date Approved/Denied:   PASRR Number:    Discharge Plan: Home(assisted living facility)    Current Diagnoses: Patient Active Problem List   Diagnosis Date Noted  . Edema 07/20/2017  . Shortness of breath 07/20/2017  . Gastroesophageal reflux disease   . Hyperlipidemia   . AKI (acute kidney injury) ([place]) 07/19/2017  . Acute respiratory failure ([place]) 07/30/2013  . GI bleed 07/29/2013  . Anemia associated with acute blood loss 07/29/2013  . Diarrhea 07/21/2013  . Renal failure, acute on chronic (HCC) 07/20/2013  . Dehydration with hyponatremia 07/20/2013  . Anemia 07/20/2013  . Acute encephalopathy 07/20/2013  . Diabetes type 2, uncontrolled ([place]) 07/20/2013  . Stroke ([place])   . Hypertension   . Hx of radiation therapy   . Cancer ([place])   . Malignant neoplasm of corpus uteri, except isthmus ([place]) 06/11/2011    Orientation RESPIRATION BLADDER Height & Weight     Self, Time, Situation, Place  O2(0.5 L) Incontinent Weight: 121 lb 11.1 oz (55.2 kg) Height:  5' (152.4 cm)  BEHAVIORAL SYMPTOMS/MOOD NEUROLOGICAL BOWEL NUTRITION STATUS      Incontinent Diet(heart healthy diet)  AMBULATORY STATUS COMMUNICATION OF NEEDS Skin   Extensive Assist Verbally Normal                       Personal Care Assistance Level of Assistance  Bathing, Feeding, Dressing Bathing Assistance: Limited assistance   Dressing Assistance:  Limited assistance     Functional Limitations Info  Sight, Hearing, Speech Sight Info: Adequate Hearing Info: Adequate Speech Info: Adequate    SPECIAL CARE FACTORS FREQUENCY                       Contractures Contractures Info: Not present    Additional Factors Info  Code Status, Allergies Code Status Info: full code Allergies Info: penicillins           Current Medications (07/23/2017):  This is the current hospital active medication list Current Facility-Administered Medications  Medication Dose Route Frequency Provider Last Rate Last Dose  . allopurinol (ZYLOPRIM) tablet 150 mg  150 mg Oral QHS Dana Allan I, MD   150 mg at 07/22/17 2137  . bisoprolol (ZEBETA) tablet 10 mg  10 mg Oral Daily Dana Allan I, MD   10 mg at 07/23/17 0851  . cholecalciferol (VITAMIN D) tablet 8,000 Units  8,000 Units Oral Daily Dana Allan I, MD   8,000 Units at 07/23/17 1105  . divalproex (DEPAKOTE SPRINKLE) capsule 375 mg  375 mg Oral QHS Dana Allan I, MD   375 mg at 07/22/17 2140  . ferrous sulfate tablet 325 mg  325 mg Oral Q breakfast Dana Allan I, MD   325 mg at 07/23/17 0852  . FLUoxetine (PROZAC) capsule 20 mg  20 mg Oral Daily Dana Allan I, MD   20 mg at 07/23/17 0852  .  fluticasone (FLONASE) 50 MCG/ACT nasal spray 1 spray  1 spray Each Nare Daily Dana Allan I, MD   1 spray at 07/23/17 1106  . furosemide (LASIX) injection 60 mg  60 mg Intravenous Q8H Eugenie Filler, MD   60 mg at 07/23/17 0532  . gabapentin (NEURONTIN) capsule 300 mg  300 mg Oral QHS Dana Allan I, MD   300 mg at 07/22/17 2141  . heparin injection 5,000 Units  5,000 Units Subcutaneous Q8H Dana Allan I, MD   5,000 Units at 07/23/17 0532  . hydrALAZINE (APRESOLINE) tablet 25 mg  25 mg Oral QID Dana Allan I, MD   25 mg at 07/23/17 0852  . insulin aspart (novoLOG) injection 0-5 Units  0-5 Units Subcutaneous QHS Dana Allan I, MD      . insulin  aspart (novoLOG) injection 0-9 Units  0-9 Units Subcutaneous TID WC Dana Allan I, MD   1 Units at 07/22/17 1211  . insulin glargine (LANTUS) injection 20 Units  20 Units Subcutaneous Q12H Bonnell Public, MD   20 Units at 07/23/17 [phone]  . magnesium oxide (MAG-OX) tablet 400 mg  400 mg Oral Daily Dana Allan I, MD   400 mg at 07/23/17 4332  . naphazoline-glycerin (CLEAR EYES REDNESS) ophth solution 1 drop  1 drop Both Eyes QID PRN Dana Allan I, MD      . olopatadine (PATANOL) 0.1 % ophthalmic solution 1 drop  1 drop Both Eyes BID Dana Allan I, MD   1 drop at 07/23/17 1106  . pantoprazole (PROTONIX) EC tablet 20 mg  20 mg Oral Daily Dana Allan I, MD   20 mg at 07/23/17 0852  . promethazine (PHENERGAN) suppository 25 mg  25 mg Rectal Q6H PRN Dana Allan I, MD      . rOPINIRole (REQUIP) tablet 1 mg  1 mg Oral BID Dana Allan I, MD   1 mg at 07/23/17 0852  . simvastatin (ZOCOR) tablet 20 mg  20 mg Oral QHS Dana Allan I, MD   20 mg at 07/22/17 2140  . sodium chloride (OCEAN) 0.65 % nasal spray 1 spray  1 spray Nasal QID PRN Dana Allan I, MD      . traZODone (DESYREL) tablet 50 mg  50 mg Oral Once Blount, Scarlette Shorts T, NP      . vitamin B-12 (CYANOCOBALAMIN) tablet 1,000 mcg  1,000 mcg Oral Daily Dana Allan I, MD   1,000 mcg at 07/23/17 9518     Discharge Medications:  Relevant Imaging Results:  Relevant Lab Results:   Additional Information SS# [id number]  Nila Nephew, LCSW

## 2017-11-14 ENCOUNTER — Observation Stay (HOSPITAL_COMMUNITY)
Admission: EM | Admit: 2017-11-14 | Discharge: 2017-11-15 | Disposition: A | Discharge status: Nursing Home (Includes ICF) | Payer: Medicare Other | Attending: Family Medicine | Admitting: Family Medicine

## 2017-11-14 ENCOUNTER — Other Ambulatory Visit: Payer: Self-pay

## 2017-11-14 ENCOUNTER — Emergency Department (HOSPITAL_COMMUNITY): Payer: Medicare Other

## 2017-11-14 ENCOUNTER — Encounter (HOSPITAL_COMMUNITY): Payer: Self-pay | Admitting: Emergency Medicine

## 2017-11-14 DIAGNOSIS — K219 Gastro-esophageal reflux disease without esophagitis: Secondary | ICD-10-CM | POA: Diagnosis not present

## 2017-11-14 DIAGNOSIS — J9621 Acute and chronic respiratory failure with hypoxia: Principal | ICD-10-CM | POA: Diagnosis present

## 2017-11-14 DIAGNOSIS — Z8673 Personal history of transient ischemic attack (TIA), and cerebral infarction without residual deficits: Secondary | ICD-10-CM | POA: Diagnosis not present

## 2017-11-14 DIAGNOSIS — Z794 Long term (current) use of insulin: Secondary | ICD-10-CM | POA: Insufficient documentation

## 2017-11-14 DIAGNOSIS — B348 Other viral infections of unspecified site: Secondary | ICD-10-CM | POA: Insufficient documentation

## 2017-11-14 DIAGNOSIS — Z9981 Dependence on supplemental oxygen: Secondary | ICD-10-CM | POA: Insufficient documentation

## 2017-11-14 DIAGNOSIS — Z87891 Personal history of nicotine dependence: Secondary | ICD-10-CM | POA: Diagnosis not present

## 2017-11-14 DIAGNOSIS — R0602 Shortness of breath: Secondary | ICD-10-CM | POA: Diagnosis present

## 2017-11-14 DIAGNOSIS — E785 Hyperlipidemia, unspecified: Secondary | ICD-10-CM | POA: Diagnosis not present

## 2017-11-14 DIAGNOSIS — N184 Chronic kidney disease, stage 4 (severe): Secondary | ICD-10-CM | POA: Diagnosis present

## 2017-11-14 DIAGNOSIS — E1122 Type 2 diabetes mellitus with diabetic chronic kidney disease: Secondary | ICD-10-CM | POA: Diagnosis not present

## 2017-11-14 DIAGNOSIS — D539 Nutritional anemia, unspecified: Secondary | ICD-10-CM | POA: Diagnosis present

## 2017-11-14 DIAGNOSIS — R490 Dysphonia: Secondary | ICD-10-CM | POA: Insufficient documentation

## 2017-11-14 DIAGNOSIS — Z923 Personal history of irradiation: Secondary | ICD-10-CM | POA: Insufficient documentation

## 2017-11-14 DIAGNOSIS — Z7951 Long term (current) use of inhaled steroids: Secondary | ICD-10-CM | POA: Diagnosis not present

## 2017-11-14 DIAGNOSIS — I5032 Chronic diastolic (congestive) heart failure: Secondary | ICD-10-CM | POA: Insufficient documentation

## 2017-11-14 DIAGNOSIS — Z79899 Other long term (current) drug therapy: Secondary | ICD-10-CM | POA: Insufficient documentation

## 2017-11-14 DIAGNOSIS — I69354 Hemiplegia and hemiparesis following cerebral infarction affecting left non-dominant side: Secondary | ICD-10-CM | POA: Insufficient documentation

## 2017-11-14 DIAGNOSIS — Z8542 Personal history of malignant neoplasm of other parts of uterus: Secondary | ICD-10-CM | POA: Insufficient documentation

## 2017-11-14 DIAGNOSIS — Z880 Allergy status to penicillin: Secondary | ICD-10-CM | POA: Diagnosis not present

## 2017-11-14 DIAGNOSIS — R05 Cough: Secondary | ICD-10-CM

## 2017-11-14 DIAGNOSIS — Z66 Do not resuscitate: Secondary | ICD-10-CM | POA: Insufficient documentation

## 2017-11-14 DIAGNOSIS — J209 Acute bronchitis, unspecified: Secondary | ICD-10-CM | POA: Diagnosis not present

## 2017-11-14 DIAGNOSIS — I130 Hypertensive heart and chronic kidney disease with heart failure and stage 1 through stage 4 chronic kidney disease, or unspecified chronic kidney disease: Secondary | ICD-10-CM | POA: Diagnosis not present

## 2017-11-14 DIAGNOSIS — R059 Cough, unspecified: Secondary | ICD-10-CM

## 2017-11-14 DIAGNOSIS — I10 Essential (primary) hypertension: Secondary | ICD-10-CM | POA: Diagnosis present

## 2017-11-14 DIAGNOSIS — F39 Unspecified mood [affective] disorder: Secondary | ICD-10-CM | POA: Insufficient documentation

## 2017-11-14 LAB — CBC WITH DIFFERENTIAL/PLATELET
Abs Immature Granulocytes: 0.1 10*3/uL (ref 0.0–0.1)
BASOS ABS: 0 10*3/uL (ref 0.0–0.1)
Basophils Relative: 0 %
Eosinophils Absolute: 0.2 10*3/uL (ref 0.0–0.7)
Eosinophils Relative: 2 %
HEMATOCRIT: 29.2 % — AB (ref 36.0–46.0)
HEMOGLOBIN: 8.8 g/dL — AB (ref 12.0–15.0)
IMMATURE GRANULOCYTES: 1 %
LYMPHS ABS: 3.1 10*3/uL (ref 0.7–4.0)
LYMPHS PCT: 27 %
MCH: 31.4 pg (ref 26.0–34.0)
MCHC: 30.1 g/dL (ref 30.0–36.0)
MCV: 104.3 fL — ABNORMAL HIGH (ref 78.0–100.0)
Monocytes Absolute: 1.1 10*3/uL — ABNORMAL HIGH (ref 0.1–1.0)
Monocytes Relative: 9 %
NEUTROS ABS: 6.8 10*3/uL (ref 1.7–7.7)
NEUTROS PCT: 61 %
Platelets: 195 10*3/uL (ref 150–400)
RBC: 2.8 MIL/uL — AB (ref 3.87–5.11)
RDW: 15.9 % — ABNORMAL HIGH (ref 11.5–15.5)
WBC: 11.3 10*3/uL — AB (ref 4.0–10.5)

## 2017-11-14 LAB — GLUCOSE, CAPILLARY
GLUCOSE-CAPILLARY: 110 mg/dL — AB (ref 70–99)
Glucose-Capillary: 254 mg/dL — ABNORMAL HIGH (ref 70–99)

## 2017-11-14 LAB — COMPREHENSIVE METABOLIC PANEL
ALT: 12 U/L (ref 0–44)
AST: 14 U/L — AB (ref 15–41)
Albumin: 3.3 g/dL — ABNORMAL LOW (ref 3.5–5.0)
Alkaline Phosphatase: 35 U/L — ABNORMAL LOW (ref 38–126)
Anion gap: 11 (ref 5–15)
BUN: 37 mg/dL — AB (ref 8–23)
CHLORIDE: 94 mmol/L — AB (ref 98–111)
CO2: 32 mmol/L (ref 22–32)
Calcium: 9.7 mg/dL (ref 8.9–10.3)
Creatinine, Ser: 2.4 mg/dL — ABNORMAL HIGH (ref 0.44–1.00)
GFR, EST AFRICAN AMERICAN: 20 mL/min — AB (ref >60)
GFR, EST NON AFRICAN AMERICAN: 17 mL/min — AB (ref >60)
Glucose, Bld: 71 mg/dL (ref 70–99)
Potassium: 3.6 mmol/L (ref 3.5–5.1)
Sodium: 137 mmol/L (ref 135–145)
Total Bilirubin: 0.5 mg/dL (ref 0.3–1.2)
Total Protein: 6.8 g/dL (ref 6.5–8.1)

## 2017-11-14 LAB — I-STAT TROPONIN, ED: Troponin i, poc: 0.02 ng/mL (ref 0.00–0.08)

## 2017-11-14 LAB — PROCALCITONIN: PROCALCITONIN: 0.14 ng/mL

## 2017-11-14 LAB — VALPROIC ACID LEVEL: VALPROIC ACID LVL: 23 ug/mL — AB (ref 50.0–100.0)

## 2017-11-14 LAB — BRAIN NATRIURETIC PEPTIDE: B Natriuretic Peptide: 625.3 pg/mL — ABNORMAL HIGH (ref 0.0–100.0)

## 2017-11-14 MED ORDER — INSULIN ASPART 100 UNIT/ML ~~LOC~~ SOLN
0.0000 [IU] | Freq: Three times a day (TID) | SUBCUTANEOUS | Status: DC
  Administered 2017-11-15: 2 [IU] via SUBCUTANEOUS
  Administered 2017-11-15: 8 [IU] via SUBCUTANEOUS
  Administered 2017-11-15: 11 [IU] via SUBCUTANEOUS

## 2017-11-14 MED ORDER — ONDANSETRON HCL 4 MG PO TABS: 4.0000 mg | ORAL_TABLET | Freq: Four times a day (QID) | ORAL | Status: DC | PRN

## 2017-11-14 MED ORDER — FLUTICASONE PROPIONATE 50 MCG/ACT NA SUSP
1.0000 | Freq: Every day | NASAL | Status: DC
  Administered 2017-11-15: 1 via NASAL
  Filled 2017-11-14: qty 16

## 2017-11-14 MED ORDER — HYDRALAZINE HCL 25 MG PO TABS
25.0000 mg | ORAL_TABLET | Freq: Four times a day (QID) | ORAL | Status: DC
  Administered 2017-11-14 – 2017-11-15 (×5): 25 mg via ORAL
  Filled 2017-11-14 (×5): qty 1

## 2017-11-14 MED ORDER — DIVALPROEX SODIUM 125 MG PO CSDR
125.0000 mg | DELAYED_RELEASE_CAPSULE | Freq: Two times a day (BID) | ORAL | Status: DC
  Administered 2017-11-15 (×2): 125 mg via ORAL
  Filled 2017-11-14 (×2): qty 1

## 2017-11-14 MED ORDER — FERROUS SULFATE 325 (65 FE) MG PO TABS
325.0000 mg | ORAL_TABLET | Freq: Every day | ORAL | Status: DC
  Administered 2017-11-15: 325 mg via ORAL
  Filled 2017-11-14: qty 1

## 2017-11-14 MED ORDER — ENOXAPARIN SODIUM 30 MG/0.3ML ~~LOC~~ SOLN
30.0000 mg | SUBCUTANEOUS | Status: DC
  Administered 2017-11-14 – 2017-11-15 (×2): 30 mg via SUBCUTANEOUS
  Filled 2017-11-14 (×2): qty 0.3

## 2017-11-14 MED ORDER — MAGNESIUM OXIDE 400 (241.3 MG) MG PO TABS
400.0000 mg | ORAL_TABLET | Freq: Every day | ORAL | Status: DC
  Administered 2017-11-14 – 2017-11-15 (×2): 400 mg via ORAL
  Filled 2017-11-14 (×2): qty 1

## 2017-11-14 MED ORDER — ORAL CARE MOUTH RINSE
15.0000 mL | Freq: Two times a day (BID) | OROMUCOSAL | Status: DC
  Administered 2017-11-14 – 2017-11-15 (×2): 15 mL via OROMUCOSAL

## 2017-11-14 MED ORDER — DOCUSATE SODIUM 100 MG PO CAPS
100.0000 mg | ORAL_CAPSULE | Freq: Two times a day (BID) | ORAL | Status: DC
  Administered 2017-11-14: 100 mg via ORAL
  Filled 2017-11-14: qty 1

## 2017-11-14 MED ORDER — ROPINIROLE HCL 1 MG PO TABS
1.0000 mg | ORAL_TABLET | Freq: Two times a day (BID) | ORAL | Status: DC
  Administered 2017-11-14 – 2017-11-15 (×2): 1 mg via ORAL
  Filled 2017-11-14 (×2): qty 1

## 2017-11-14 MED ORDER — BISOPROLOL FUMARATE 10 MG PO TABS
10.0000 mg | ORAL_TABLET | Freq: Every day | ORAL | Status: DC
  Administered 2017-11-14 – 2017-11-15 (×2): 10 mg via ORAL
  Filled 2017-11-14 (×2): qty 1

## 2017-11-14 MED ORDER — TORSEMIDE 20 MG PO TABS
40.0000 mg | ORAL_TABLET | Freq: Every day | ORAL | Status: DC
  Administered 2017-11-15: 40 mg via ORAL
  Filled 2017-11-14: qty 2

## 2017-11-14 MED ORDER — IPRATROPIUM-ALBUTEROL 0.5-2.5 (3) MG/3ML IN SOLN
3.0000 mL | Freq: Once | RESPIRATORY_TRACT | Status: AC
  Administered 2017-11-14: 3 mL via RESPIRATORY_TRACT
  Filled 2017-11-14: qty 3

## 2017-11-14 MED ORDER — ALBUTEROL SULFATE (2.5 MG/3ML) 0.083% IN NEBU: 2.5000 mg | INHALATION_SOLUTION | RESPIRATORY_TRACT | Status: DC | PRN

## 2017-11-14 MED ORDER — ALLOPURINOL 300 MG PO TABS
150.0000 mg | ORAL_TABLET | Freq: Every day | ORAL | Status: DC
  Administered 2017-11-14: 150 mg via ORAL
  Filled 2017-11-14: qty 1

## 2017-11-14 MED ORDER — TRAZODONE HCL 50 MG PO TABS
50.0000 mg | ORAL_TABLET | Freq: Every day | ORAL | Status: DC
Start: 2017-11-14 — End: 2017-11-15
  Administered 2017-11-14: 50 mg via ORAL
  Filled 2017-11-14: qty 1

## 2017-11-14 MED ORDER — PANTOPRAZOLE SODIUM 20 MG PO TBEC
20.0000 mg | DELAYED_RELEASE_TABLET | Freq: Every day | ORAL | Status: DC
  Administered 2017-11-14 – 2017-11-15 (×2): 20 mg via ORAL
  Filled 2017-11-14 (×2): qty 1

## 2017-11-14 MED ORDER — INSULIN DETEMIR 100 UNIT/ML ~~LOC~~ SOLN
45.0000 [IU] | Freq: Every day | SUBCUTANEOUS | Status: DC
  Administered 2017-11-15: 45 [IU] via SUBCUTANEOUS
  Filled 2017-11-14: qty 0.45

## 2017-11-14 MED ORDER — FLUOXETINE HCL 20 MG PO TABS
30.0000 mg | ORAL_TABLET | Freq: Every day | ORAL | Status: DC
  Filled 2017-11-14: qty 2

## 2017-11-14 MED ORDER — HYDRALAZINE HCL 25 MG PO TABS
25.0000 mg | ORAL_TABLET | Freq: Once | ORAL | Status: AC
  Administered 2017-11-14: 25 mg via ORAL
  Filled 2017-11-14: qty 1

## 2017-11-14 MED ORDER — GABAPENTIN 400 MG PO CAPS
400.0000 mg | ORAL_CAPSULE | Freq: Every day | ORAL | Status: DC
  Administered 2017-11-14: 400 mg via ORAL
  Filled 2017-11-14: qty 1

## 2017-11-14 MED ORDER — IPRATROPIUM-ALBUTEROL 0.5-2.5 (3) MG/3ML IN SOLN
3.0000 mL | Freq: Four times a day (QID) | RESPIRATORY_TRACT | Status: DC
  Administered 2017-11-15: 3 mL via RESPIRATORY_TRACT
  Filled 2017-11-14 (×3): qty 3

## 2017-11-14 MED ORDER — METHYLPREDNISOLONE SODIUM SUCC 125 MG IJ SOLR
80.0000 mg | Freq: Two times a day (BID) | INTRAMUSCULAR | Status: DC
  Administered 2017-11-15: 80 mg via INTRAVENOUS
  Filled 2017-11-14: qty 2

## 2017-11-14 MED ORDER — ALBUTEROL SULFATE (2.5 MG/3ML) 0.083% IN NEBU
5.0000 mg | INHALATION_SOLUTION | Freq: Once | RESPIRATORY_TRACT | Status: AC
  Administered 2017-11-14: 5 mg via RESPIRATORY_TRACT
  Filled 2017-11-14: qty 6

## 2017-11-14 MED ORDER — ALBUTEROL (5 MG/ML) CONTINUOUS INHALATION SOLN
10.0000 mg/h | INHALATION_SOLUTION | Freq: Once | RESPIRATORY_TRACT | Status: AC
  Administered 2017-11-14: 10 mg/h via RESPIRATORY_TRACT
  Filled 2017-11-14: qty 20

## 2017-11-14 MED ORDER — ACETAMINOPHEN 325 MG PO TABS: 650.0000 mg | ORAL_TABLET | Freq: Four times a day (QID) | ORAL | Status: DC | PRN

## 2017-11-14 MED ORDER — DIVALPROEX SODIUM 125 MG PO CSDR
375.0000 mg | DELAYED_RELEASE_CAPSULE | Freq: Every day | ORAL | Status: DC
  Administered 2017-11-14: 375 mg via ORAL
  Filled 2017-11-14 (×2): qty 3

## 2017-11-14 MED ORDER — PREDNISONE 20 MG PO TABS: 40.0000 mg | ORAL_TABLET | Freq: Every day | ORAL | Status: DC

## 2017-11-14 MED ORDER — ONDANSETRON HCL 4 MG/2ML IJ SOLN: 4.0000 mg | Freq: Four times a day (QID) | INTRAMUSCULAR | Status: DC | PRN

## 2017-11-14 MED ORDER — ACETAMINOPHEN 650 MG RE SUPP: 650.0000 mg | Freq: Four times a day (QID) | RECTAL | Status: DC | PRN

## 2017-11-14 MED ORDER — METHYLPREDNISOLONE SODIUM SUCC 125 MG IJ SOLR
80.0000 mg | Freq: Once | INTRAMUSCULAR | Status: AC
  Administered 2017-11-14: 80 mg via INTRAVENOUS
  Filled 2017-11-14: qty 2

## 2017-11-14 NOTE — ED Triage Notes (Signed)
Pt brought in by EMS from [place] for cough for last few days and SOB. Pt denies sputum or fever. Denies N/V/D. Pt AO x 4. 182/70, HR 74, 18 resp, CBG 144, Temp 97, 99% on 2 L nasal cannula (for comfort)

## 2017-11-14 NOTE — ED Provider Notes (Signed)
[facility] EMERGENCY DEPARTMENT Provider Note   CSN: [id number] Arrival date & time: 11/14/17  0957     History   Chief Complaint Chief Complaint  Patient presents with  . Cough    HPI [name] is a 84 y.o. female.  HPI   [name] is a 84 y.o. female, with a history of DM, HTN, CKD, and stroke, presenting to the ED with productive cough for the past 4 days.  Accompanied by shortness of breath and sore throat.  States she typically wears supplemental O2 at night, but has had to start wearing it during the day as well for her shortness of breath.  Denies fever/chills, N/V/D, chest pain, abdominal pain, urinary symptoms, or any other complaints.  Past Medical History:  Diagnosis Date  . Cancer ([place]) 07/04/10   endometrial  . Diabetes mellitus   . Falling episodes   . Gout   . Hx of radiation therapy 09/13/10- 10/19/10, 6/12, 6/19, 11/14/10   pelvis, external beam tehn brachytherapy  . Hypertension   . Pneumonia   . Renal disorder   . Stroke [facility])    L sided deficits w/slurred speech    Patient Active Problem List   Diagnosis Date Noted  . CKD (chronic kidney disease), stage IV ([place]) 07/23/2017  . Edema 07/20/2017  . Shortness of breath 07/20/2017  . Gastroesophageal reflux disease   . Hyperlipidemia   . AKI (acute kidney injury) ([place]) 07/19/2017  . Acute respiratory failure ([place]) 07/30/2013  . GI bleed 07/29/2013  . Anemia associated with acute blood loss 07/29/2013  . Diarrhea 07/21/2013  . Renal failure, acute on chronic (HCC) 07/20/2013  . Dehydration with hyponatremia 07/20/2013  . Anemia 07/20/2013  . Acute encephalopathy 07/20/2013  . Diabetes type 2, uncontrolled ([place]) 07/20/2013  . Stroke ([place])   . Hypertension   . Hx of radiation therapy   . Cancer ([place])   . Malignant neoplasm of corpus uteri, except isthmus ([place]) 06/11/2011    Past Surgical History:  Procedure Laterality Date  . ABDOMINAL HYSTERECTOMY       OB  History    Gravida  2   Para  2   Term      Preterm      AB      Living        SAB      TAB      Ectopic      Multiple      Live Births               Home Medications    Prior to Admission medications   Medication Sig Start Date End Date Taking? Authorizing Provider  acetaminophen (TYLENOL) 325 MG tablet Take 650 mg by mouth every 8 (eight) hours as needed for moderate pain.   Yes [provider]  acetaminophen (TYLENOL) 325 MG tablet Take by mouth 3 (three) times daily.   Yes [provider]  allopurinol (ZYLOPRIM) 300 MG tablet Take 150 mg by mouth at bedtime.   Yes [provider]  bisoprolol (ZEBETA) 10 MG tablet Take 1 tablet (10 mg total) by mouth daily. 07/23/13  Yes Josetta Huddle, MD  Cholecalciferol (HM VITAMIN D3) 4000 UNITS CAPS Take 8,000 Units by mouth daily.   Yes [provider]  Cyanocobalamin (VITAMIN B 12 PO) Take 1 tablet by mouth daily.    Yes [provider]  Dextromethorphan-guaiFENesin 10-100 MG/5ML liquid Take 10 mLs by mouth every 4 (  four) hours as needed (cough).   Yes [provider]  divalproex (DEPAKOTE SPRINKLE) 125 MG capsule Take 125 mg by mouth 2 (two) times daily.  07/14/17  Yes [provider]  divalproex (DEPAKOTE SPRINKLE) 125 MG capsule Take 375 mg by mouth at bedtime.   Yes [provider]  ferrous sulfate 325 (65 FE) MG tablet Take 325 mg by mouth daily with breakfast.   Yes [provider]  FLUoxetine (PROZAC) 20 MG tablet Take 30 mg by mouth daily.   Yes [provider]  fluticasone (FLONASE) 50 MCG/ACT nasal spray Place 1 spray into both nostrils daily.   Yes [provider]  gabapentin (NEURONTIN) 400 MG capsule Take 400 mg by mouth at bedtime.   Yes [provider]  hydrALAZINE (APRESOLINE) 25 MG tablet Take 25 mg by mouth 4 (four) times daily.    Yes [provider]  LEVEMIR FLEXTOUCH 100 UNIT/ML Pen Inject 45  Units into the skin daily. 10/18/17  Yes [provider]  linagliptin (TRADJENTA) 5 MG TABS tablet Take 5 mg by mouth daily.   Yes [provider]  magnesium oxide (MAG-OX) 400 MG tablet Take 400 mg by mouth daily.   Yes [provider]  Menthol, Topical Analgesic, (BIOFREEZE ROLL-ON) 4 % GEL Apply 1 application topically 2 (two) times daily.   Yes [provider]  Olopatadine HCl 0.2 % SOLN Place 1 drop into both eyes daily. 07/08/17  Yes [provider]  OXYGEN Inhale 2 L into the lungs at bedtime.   Yes [provider]  pantoprazole (PROTONIX) 20 MG tablet Take 20 mg by mouth daily. 07/04/17  Yes [provider]  Pramox-PE-Glycerin-Petrolatum (HEMORRHOIDAL MAX STR RE) Place 1 application rectally 2 (two) times daily as needed (for rectal pain).   Yes [provider]  rOPINIRole (REQUIP) 1 MG tablet Take 1 mg by mouth 2 (two) times daily.   Yes [provider]  simethicone (MYLICON) 80 MG chewable tablet Chew 80 mg by mouth 3 (three) times daily as needed for flatulence (and bloating).   Yes [provider]  sodium chloride (DEEP SEA NASAL SPRAY) 0.65 % nasal spray Place 1 spray into the nose 4 (four) times daily as needed for congestion.    Yes [provider]  tetrahydrozoline 0.05 % ophthalmic solution Place 2 drops into both eyes 2 (two) times daily.   Yes [provider]  torsemide (DEMADEX) 20 MG tablet Take 2 tablets (40 mg total) by mouth daily. 07/23/17  Yes Eugenie Filler, MD  traMADol (ULTRAM) 50 MG tablet Take 1 tablet (50 mg total) by mouth 2 (two) times daily. 1/2 tablet twice daily as needed for pain, and 1 tablet at bedtime Patient taking differently: Take 50 mg by mouth 2 (two) times daily.  07/23/17  Yes Eugenie Filler, MD  traMADol (ULTRAM) 50 MG tablet Take 25 mg by mouth every 12 (twelve) hours as needed for moderate pain.   Yes [provider]  traZODone  (DESYREL) 50 MG tablet Take 50 mg by mouth at bedtime. 06/25/17  Yes [provider]  Insulin Glargine (LANTUS SOLOSTAR) 100 UNIT/ML Solostar Pen Inject 40 Units into the skin every morning. Patient not taking: Reported on 11/14/2017 07/23/17   Eugenie Filler, MD    Family History Family History  Problem Relation Age of Onset  . Hypertension Mother   . Hypertension Father     Social History Social History   Tobacco Use  .  Smoking status: Former Smoker    Types: Cigarettes  . Smokeless tobacco: Never Used  . Tobacco comment: quit many yrs ago  Substance Use Topics  . Alcohol use: No  . Drug use: No     Allergies   Penicillins   Review of Systems Review of Systems  Constitutional: Negative for chills and fever.  HENT: Positive for sore throat. Negative for trouble swallowing and voice change.   Respiratory: Positive for cough and shortness of breath.   Cardiovascular: Negative for chest pain and leg swelling.  Gastrointestinal: Negative for abdominal pain, diarrhea, nausea and vomiting.  Genitourinary: Negative for dysuria, frequency and hematuria.  All other systems reviewed and are negative.    Physical Exam Updated Vital Signs BP (!) 198/60 (BP Location: Left Arm) Comment: Simultaneous filing. User may not have seen previous data.  Pulse 72 Comment: Simultaneous filing. User may not have seen previous data.  Temp 98.5 F (36.9 C) (Oral)   Resp (!) 25 Comment: Simultaneous filing. User may not have seen previous data.  Wt 54.4 kg (120 lb)   SpO2 100% Comment: Simultaneous filing. User may not have seen previous data.  BMI 23.44 kg/m   Physical Exam  Constitutional: She appears well-developed and well-nourished. No distress.  HENT:  Head: Normocephalic and atraumatic.  Mouth/Throat: Oropharynx is clear and moist.  Eyes: Conjunctivae are normal.  Neck: Neck supple.  Cardiovascular: Normal rate, regular rhythm, normal heart sounds and intact distal  pulses.  Pulmonary/Chest: Tachypnea noted. She has rhonchi. She has rales.  Tachypnea with conversational dyspnea.  Rales versus rhonchi in the bilateral bases, likely referred into the upper fields.  Abdominal: Soft. There is no tenderness. There is no guarding.  Musculoskeletal: She exhibits no edema.  Lymphadenopathy:    She has no cervical adenopathy.  Neurological: She is alert.  Skin: Skin is warm and dry. She is not diaphoretic.  Psychiatric: She has a normal mood and affect. Her behavior is normal.  Nursing note and vitals reviewed.    ED Treatments / Results  Labs (all labs ordered are listed, but only abnormal results are displayed) Labs Reviewed  COMPREHENSIVE METABOLIC PANEL - Abnormal; Notable for the following components:      Result Value   Chloride 94 (*)    BUN 37 (*)    Creatinine, Ser 2.40 (*)    Albumin 3.3 (*)    AST 14 (*)    Alkaline Phosphatase 35 (*)    GFR calc non Af Amer 17 (*)    GFR calc Af Amer 20 (*)    All other components within normal limits  CBC WITH DIFFERENTIAL/PLATELET - Abnormal; Notable for the following components:   WBC 11.3 (*)    RBC 2.80 (*)    Hemoglobin 8.8 (*)    HCT 29.2 (*)    MCV 104.3 (*)    RDW 15.9 (*)    Monocytes Absolute 1.1 (*)    All other components within normal limits  BRAIN NATRIURETIC PEPTIDE - Abnormal; Notable for the following components:   B Natriuretic Peptide 625.3 (*)    All other components within normal limits  I-STAT TROPONIN, ED    EKG EKG Interpretation  Date/Time:  Thursday November 14 2017 10:38:05 EDT Ventricular Rate:  72 PR Interval:    QRS Duration: 116 QT Interval:  448 QTC Calculation: 491 R Axis:   -67 Text Interpretation:  Sinus rhythm Incomplete RBBB and LAFB Confirmed by Quintella Reichert [phone]) on 11/14/2017 10:43:20 AM Also  confirmed by Quintella Reichert [phone]), editor Lynder Parents [phone])  on 11/14/2017 12:35:09 PM   Radiology Dg Chest 2 View  Result Date:  11/14/2017 CLINICAL DATA:  Cough and shortness of breath for several days EXAM: CHEST - 2 VIEW COMPARISON:  07/19/2017 FINDINGS: The heart size and mediastinal contours are within normal limits. Both lungs are clear. The visualized skeletal structures are unremarkable. IMPRESSION: No acute abnormality noted. Electronically Signed   By: Inez Catalina M.D.   On: 11/14/2017 10:48    Procedures .Critical Care Performed by: Lorayne Bender, PA-C Authorized by: Lorayne Bender, PA-C   Critical care provider statement:    Critical care time (minutes):  35   Critical care time was exclusive of:  Separately billable procedures and treating other patients   Critical care was necessary to treat or prevent imminent or life-threatening deterioration of the following conditions:  Respiratory failure   Critical care was time spent personally by me on the following activities:  Development of treatment plan with patient or surrogate, discussions with consultants, evaluation of patient's response to treatment, obtaining history from patient or surrogate, examination of patient, pulse oximetry, ordering and review of radiographic studies, ordering and review of laboratory studies, ordering and performing treatments and interventions, review of old charts and re-evaluation of patient's condition   I assumed direction of critical care for this patient from another provider in my specialty: no     (including critical care time)  Medications Ordered in ED Medications  albuterol (PROVENTIL,VENTOLIN) solution continuous neb (has no administration in time range)  ipratropium-albuterol (DUONEB) 0.5-2.5 (3) MG/3ML nebulizer solution 3 mL (3 mLs Nebulization Given 11/14/17 1122)  albuterol (PROVENTIL) (2.5 MG/3ML) 0.083% nebulizer solution 5 mg (5 mg Nebulization Given 11/14/17 1315)  methylPREDNISolone sodium succinate (SOLU-MEDROL) 125 mg/2 mL injection 80 mg (80 mg Intravenous Given 11/14/17 1431)  hydrALAZINE (APRESOLINE) tablet  25 mg (25 mg Oral Given 11/14/17 1434)     Initial Impression / Assessment and Plan / ED Course  I have reviewed the triage vital signs and the nursing notes.  Pertinent labs & imaging results that were available during my care of the patient were reviewed by me and considered in my medical decision making (see chart for details).  Clinical Course as of Nov 15 1506  Thu Nov 14, 2017  1246 Consistent with previous values.  Hemoglobin(!): 8.8 [SJ]  1255 Patient shows improvement in work of breathing. Continues to have rhonchi.   [SJ]  1304 Improvement from previous values.  Creatinine(!): 2.40 [SJ]  1408 Patient continues to have rhonchi throughout.   [SJ]  1414 Attempted to call patient's daughter, per patient request, to obtain more information. No answer, left VM.   [SJ]  1501 Spoke with Dr. Lorin Mercy, hospitalist. Agrees to admit the patient.   [SJ]    Clinical Course User Index [SJ] Johannah Rozas C, PA-C    Patient presents with cough and shortness of breath.  Rhonchi versus rales on initial lung exam.  Hypoxic with SPO2 86% on room air, even after DuoNeb and albuterol treatments, requiring 2 L supplemental O2 to maintain oxygen saturations of 95 to 100%.  Patient does have elevated BNP, however, no evidence of peripheral edema and echo performed in March 2019 showed LV EF 65 to 70%.  She did have grade 2 diastolic dysfunction. We will admit for hypoxia requiring supplemental O2.  Findings and plan of care discussed with Quintella Reichert, MD. Dr. Ralene Bathe personally evaluated and examined this patient.  Vitals:   11/14/17 1230 11/14/17 1330 11/14/17 1400 11/14/17 1415  BP:  (!) 165/55 (!) 202/60 (!) 187/55  Pulse: 77  71   Resp: (!) 23 (!) 22 19 (!) 22  Temp:      TempSrc:      SpO2: 95%  95%   Weight:         Final Clinical Impressions(s) / ED Diagnoses   Final diagnoses:  Cough  Shortness of breath    ED Discharge Orders    None       Layla Maw 11/14/17  1511    Quintella Reichert, MD 11/22/17 [phone]

## 2017-11-14 NOTE — ED Notes (Signed)
EMS IV pulled out. RN to start new line when pt comes back from xray

## 2017-11-14 NOTE — ED Notes (Signed)
Pt put on room air, 02 sat dropped to 86%, pt placed back on 02 at 2 L nasal cannula. Will continue to monitor

## 2017-11-14 NOTE — ED Notes (Signed)
Attempted IV start. Having another nurse try

## 2017-11-14 NOTE — ED Notes (Signed)
Patient transported to X-ray 

## 2017-11-14 NOTE — H&P (Signed)
History and Physical    [name] VZC:[id number] DOB: 04-12-1929 DOA: 11/14/2017  PCP: it changed and she can't think of her name Consultants:  None Patient coming from: [place]; NOK: Daughter, [phone]  Chief Complaint: Cough, SOB  HPI: [name] is a 84 y.o. female with medical history significant of CVA; stage IV CKD; DM; endometrial CA s/p radiation therapy; and HTN presenting with cough and SOB.  Hoarseness, and I can't breathe good.  Symptoms for about 4 days.  She thinks she had something like the croup.  No fevers.  +cough, nonproductive.  She usually wears O2 qhs but she has been wearing it 24/7.   ED Course:   Poor air movement - Duoneb and Albuterol somewhat improved.  O2 sats after nebs 86%.  She wears O2 at night but not usually during the day - has needed 24/7 recently.  +conversational dyspnea at first, now better.  Comfortable on home O2.  CXR unremarkable.  ?viral infection.  Review of Systems: As per HPI; otherwise review of systems reviewed and negative.  Limited by respiratory issues as well as probable dementia.  Ambulatory Status:  Learning to walk again after stroke with left hemiplegia  Past Medical History:  Diagnosis Date  . Cancer ([place]) 07/04/10   endometrial  . Diabetes mellitus   . Falling episodes   . Gout   . Hx of radiation therapy 09/13/10- 10/19/10, 6/12, 6/19, 11/14/10   pelvis, external beam tehn brachytherapy  . Hypertension   . Pneumonia   . Renal disorder   . Stroke [facility])    L sided deficits w/slurred speech    Past Surgical History:  Procedure Laterality Date  . ABDOMINAL HYSTERECTOMY      Social History   Socioeconomic History  . Marital status: Legally Separated    Spouse name: Not on file  . Number of children: Not on file  . Years of education: Not on file  . Highest education level: Not on file  Occupational History  . Not on file  Social Needs  . Financial resource strain: Not on file  . Food  insecurity:    Worry: Not on file    Inability: Not on file  . Transportation needs:    Medical: Not on file    Non-medical: Not on file  Tobacco Use  . Smoking status: Former Smoker    Types: Cigarettes  . Smokeless tobacco: Never Used  . Tobacco comment: quit many yrs ago  Substance and Sexual Activity  . Alcohol use: No  . Drug use: No  . Sexual activity: Never  Lifestyle  . Physical activity:    Days per week: Not on file    Minutes per session: Not on file  . Stress: Not on file  Relationships  . Social connections:    Talks on phone: Not on file    Gets together: Not on file    Attends religious service: Not on file    Active member of club or organization: Not on file    Attends meetings of clubs or organizations: Not on file    Relationship status: Not on file  . Intimate partner violence:    Fear of current or ex partner: Not on file    Emotionally abused: Not on file    Physically abused: Not on file    Forced sexual activity: Not on file  Other Topics Concern  . Not on file  Social History Narrative  . Not on  file    Allergies  Allergen Reactions  . Penicillins Other (See Comments)    unknown    Family History  Problem Relation Age of Onset  . Hypertension Mother   . Hypertension Father     Prior to Admission medications   Medication Sig Start Date End Date Taking? Authorizing Provider  acetaminophen (TYLENOL) 325 MG tablet Take 650 mg by mouth every 8 (eight) hours as needed for moderate pain.   Yes [provider]  acetaminophen (TYLENOL) 325 MG tablet Take by mouth 3 (three) times daily.   Yes [provider]  allopurinol (ZYLOPRIM) 300 MG tablet Take 150 mg by mouth at bedtime.   Yes [provider]  bisoprolol (ZEBETA) 10 MG tablet Take 1 tablet (10 mg total) by mouth daily. 07/23/13  Yes Josetta Huddle, MD  Cholecalciferol (HM VITAMIN D3) 4000 UNITS CAPS Take 8,000 Units by mouth daily.   Yes [provider]    Cyanocobalamin (VITAMIN B 12 PO) Take 1 tablet by mouth daily.    Yes [provider]  Dextromethorphan-guaiFENesin 10-100 MG/5ML liquid Take 10 mLs by mouth every 4 (four) hours as needed (cough).   Yes [provider]  divalproex (DEPAKOTE SPRINKLE) 125 MG capsule Take 125 mg by mouth 2 (two) times daily.  07/14/17  Yes [provider]  divalproex (DEPAKOTE SPRINKLE) 125 MG capsule Take 375 mg by mouth at bedtime.   Yes [provider]  ferrous sulfate 325 (65 FE) MG tablet Take 325 mg by mouth daily with breakfast.   Yes [provider]  FLUoxetine (PROZAC) 20 MG tablet Take 30 mg by mouth daily.   Yes [provider]  fluticasone (FLONASE) 50 MCG/ACT nasal spray Place 1 spray into both nostrils daily.   Yes [provider]  gabapentin (NEURONTIN) 400 MG capsule Take 400 mg by mouth at bedtime.   Yes [provider]  hydrALAZINE (APRESOLINE) 25 MG tablet Take 25 mg by mouth 4 (four) times daily.    Yes [provider]  LEVEMIR FLEXTOUCH 100 UNIT/ML Pen Inject 45 Units into the skin daily. 10/18/17  Yes [provider]  linagliptin (TRADJENTA) 5 MG TABS tablet Take 5 mg by mouth daily.   Yes [provider]  magnesium oxide (MAG-OX) 400 MG tablet Take 400 mg by mouth daily.   Yes [provider]  Menthol, Topical Analgesic, (BIOFREEZE ROLL-ON) 4 % GEL Apply 1 application topically 2 (two) times daily.   Yes [provider]  Olopatadine HCl 0.2 % SOLN Place 1 drop into both eyes daily. 07/08/17  Yes [provider]  OXYGEN Inhale 2 L into the lungs at bedtime.   Yes [provider]  pantoprazole (PROTONIX) 20 MG tablet Take 20 mg by mouth daily. 07/04/17  Yes [provider]  Pramox-PE-Glycerin-Petrolatum (HEMORRHOIDAL MAX STR RE) Place 1 application rectally 2 (two) times daily as needed (for rectal pain).   Yes [provider]  rOPINIRole (REQUIP) 1  MG tablet Take 1 mg by mouth 2 (two) times daily.   Yes [provider]  simethicone (MYLICON) 80 MG chewable tablet Chew 80 mg by mouth 3 (three) times daily as needed for flatulence (and bloating).   Yes [provider]  sodium chloride (DEEP SEA NASAL SPRAY) 0.65 % nasal spray Place 1 spray into the nose 4 (four) times daily as needed for congestion.    Yes [provider]  tetrahydrozoline 0.05 % ophthalmic solution Place 2 drops into  both eyes 2 (two) times daily.   Yes [provider]  torsemide (DEMADEX) 20 MG tablet Take 2 tablets (40 mg total) by mouth daily. 07/23/17  Yes Eugenie Filler, MD  traMADol (ULTRAM) 50 MG tablet Take 1 tablet (50 mg total) by mouth 2 (two) times daily. 1/2 tablet twice daily as needed for pain, and 1 tablet at bedtime Patient taking differently: Take 50 mg by mouth 2 (two) times daily.  07/23/17  Yes Eugenie Filler, MD  traMADol (ULTRAM) 50 MG tablet Take 25 mg by mouth every 12 (twelve) hours as needed for moderate pain.   Yes [provider]  traZODone (DESYREL) 50 MG tablet Take 50 mg by mouth at bedtime. 06/25/17  Yes [provider]  Insulin Glargine (LANTUS SOLOSTAR) 100 UNIT/ML Solostar Pen Inject 40 Units into the skin every morning. Patient not taking: Reported on 11/14/2017 07/23/17   Eugenie Filler, MD    Physical Exam: Vitals:   11/14/17 1510 11/14/17 1513 11/14/17 1530 11/14/17 1633  BP:   (!) 170/46 (!) 188/54  Pulse:   96 (!) 105  Resp:   19 18  Temp:    98.5 F (36.9 C)  TempSrc:    Oral  SpO2: 97% 100% 100% 93%  Weight:         General:  Appears ill but appropriate, undergoing a neb treatment Eyes:  PERRL, EOMI, normal lids, iris ENT:  grossly normal hearing, lips; neb treatment in place Neck:  no LAD, masses or thyromegaly Cardiovascular:  RRR, no m/r/g. No LE edema.  Respiratory:  Diffuse rhonchorous breath sounds.  Mildly to moderately increased respiratory  effort. Abdomen:  soft, NT, ND, NABS Skin:  no rash or induration seen on limited exam Musculoskeletal:  grossly normal tone BUE/BLE, good ROM, no bony abnormality Psychiatric:  grossly normal mood and affect, speech fluent and appropriate, AOx3 Neurologic:  CN 2-12 grossly intact, moves all extremities in coordinated fashion, sensation intact    Radiological Exams on Admission: Dg Chest 2 View  Result Date: 11/14/2017 CLINICAL DATA:  Cough and shortness of breath for several days EXAM: CHEST - 2 VIEW COMPARISON:  07/19/2017 FINDINGS: The heart size and mediastinal contours are within normal limits. Both lungs are clear. The visualized skeletal structures are unremarkable. IMPRESSION: No acute abnormality noted. Electronically Signed   By: Inez Catalina M.D.   On: 11/14/2017 10:48    EKG: Independently reviewed.  NSR with rate 72; Incomplete RBBB and LAFB with no evidence of acute ischemia   Labs on Admission: I have personally reviewed the available labs and imaging studies at the time of the admission.  Pertinent labs:   BUN 37/Creatinine 2.40/GFR 20; 46/2.78/17 on 3/12 BNP 625.3; 282.5 on 3/8 Troponin 0.02 WBC 11.3 Hgb 8.8, stable  Assessment/Plan Principal Problem:   Acute on chronic respiratory failure with hypoxia (HCC) Active Problems:   Anemia   Hypertension   CKD (chronic kidney disease), stage IV (HCC)   Acute on chronic respiratory failure with hypoxia -Patient without apparent prior dx of COPD but with h/o tobacco use presenting with recurrent respiratory failure -Prior admission did not lead to apparent diagnosis but she was discharged on qhs O2 -She has acute respiratory symptoms and has progressed to 24/7 O2 the last few days -While this could be a respiratory virus causing her symptoms (viral panel pending), COPD with exacerbation seems more likely -Her CXR appears to show hyperinflation with diaphragmatic flattening  -She does not have fever and  has only mild  leukocytosis.  -Chest x-ray is not consistent with pneumonia -She was given a continuous neb treatment in the ED with some improvement.   -will observe for now on med surg -Nebulizers: scheduled Duoneb and prn albuterol -Solu-Medrol 80 mg IV BID  -IV Azithromycin  CKD -Appears to be at baseline -She does not appear to be a good candidate for HD and so it may be reasonable to begin consideration of palliative care discussions  Anemia -Appears to be stable -Likely associated with chronic kidney disease  HTN -Continue bisoprolol; hydralazine  DM -Fairly recent A1c 6.5, indicating appropriate control -hold Tradjenta -Continue Levemir -Cover with moderate-scale SSI  *Note: she appears to be on Depakote for post-stroke, although this is not entirely clear by a cursory review of old records.  For now, will continue and check depakote level.  However, in case this medication is due to prior seizure(s), will hold Ultram and suggest consideration of discontinuation since it can lower the seizure threshold.   DVT prophylaxis: Lovenox  Code Status:  DNR - confirmed with patient Family Communication: None present Disposition Plan:  Home once clinically improved Consults called: CM/SW/PT/OT/Nutrition/RT  Admission status: It is my clinical opinion that referral for OBSERVATION is reasonable and necessary in this patient based on the above information provided. The aforementioned taken together are felt to place the patient at high risk for further clinical deterioration. However it is anticipated that the patient may be medically stable for discharge from the hospital within 24 to 48 hours.     Karmen Bongo MD [facility]  If note is complete, please contact covering daytime or nighttime physician. [email or web address] Password [facility]  11/14/2017, 4:56 PM

## 2017-11-14 NOTE — ED Notes (Signed)
Pt continues to take pulse ox of finger, pt in no distress, only validating accurate numbers

## 2017-11-14 NOTE — Progress Notes (Signed)
Patient refused treatment at this time, stated she knows her rights and she is tired of all these medications. No distress noted at this time RCP will continue to follow.

## 2017-11-15 DIAGNOSIS — J9621 Acute and chronic respiratory failure with hypoxia: Secondary | ICD-10-CM | POA: Diagnosis not present

## 2017-11-15 LAB — BASIC METABOLIC PANEL
ANION GAP: 12 (ref 5–15)
BUN: 42 mg/dL — AB (ref 8–23)
CALCIUM: 9.5 mg/dL (ref 8.9–10.3)
CHLORIDE: 93 mmol/L — AB (ref 98–111)
CO2: 30 mmol/L (ref 22–32)
Creatinine, Ser: 2.54 mg/dL — ABNORMAL HIGH (ref 0.44–1.00)
GFR calc Af Amer: 19 mL/min — ABNORMAL LOW (ref >60)
GFR calc non Af Amer: 16 mL/min — ABNORMAL LOW (ref >60)
GLUCOSE: 292 mg/dL — AB (ref 70–99)
Potassium: 4.5 mmol/L (ref 3.5–5.1)
Sodium: 135 mmol/L (ref 135–145)

## 2017-11-15 LAB — RESPIRATORY PANEL BY PCR
ADENOVIRUS-RVPPCR: NOT DETECTED
Bordetella pertussis: NOT DETECTED
CORONAVIRUS 229E-RVPPCR: NOT DETECTED
CORONAVIRUS HKU1-RVPPCR: NOT DETECTED
CORONAVIRUS NL63-RVPPCR: NOT DETECTED
CORONAVIRUS OC43-RVPPCR: NOT DETECTED
Chlamydophila pneumoniae: NOT DETECTED
INFLUENZA B-RVPPCR: NOT DETECTED
Influenza A: NOT DETECTED
MYCOPLASMA PNEUMONIAE-RVPPCR: NOT DETECTED
Metapneumovirus: NOT DETECTED
Parainfluenza Virus 1: NOT DETECTED
Parainfluenza Virus 2: NOT DETECTED
Parainfluenza Virus 3: DETECTED — AB
Parainfluenza Virus 4: NOT DETECTED
RESPIRATORY SYNCYTIAL VIRUS-RVPPCR: NOT DETECTED
Rhinovirus / Enterovirus: NOT DETECTED

## 2017-11-15 LAB — GLUCOSE, CAPILLARY
GLUCOSE-CAPILLARY: 264 mg/dL — AB (ref 70–99)
GLUCOSE-CAPILLARY: 142 mg/dL — AB (ref 70–99)
Glucose-Capillary: 310 mg/dL — ABNORMAL HIGH (ref 70–99)

## 2017-11-15 LAB — CBC
HCT: 26.6 % — ABNORMAL LOW (ref 36.0–46.0)
Hemoglobin: 8.2 g/dL — ABNORMAL LOW (ref 12.0–15.0)
MCH: 31.4 pg (ref 26.0–34.0)
MCHC: 30.8 g/dL (ref 30.0–36.0)
MCV: 101.9 fL — AB (ref 78.0–100.0)
PLATELETS: 180 10*3/uL (ref 150–400)
RBC: 2.61 MIL/uL — AB (ref 3.87–5.11)
RDW: 15.7 % — AB (ref 11.5–15.5)
WBC: 10.9 10*3/uL — ABNORMAL HIGH (ref 4.0–10.5)

## 2017-11-15 MED ORDER — PREDNISONE 20 MG PO TABS
40.0000 mg | ORAL_TABLET | Freq: Once | ORAL | Status: AC
  Administered 2017-11-15: 40 mg via ORAL
  Filled 2017-11-15: qty 2

## 2017-11-15 MED ORDER — FLUOXETINE HCL 20 MG PO CAPS
30.0000 mg | ORAL_CAPSULE | Freq: Every day | ORAL | Status: DC
  Administered 2017-11-15: 30 mg via ORAL
  Filled 2017-11-15: qty 1

## 2017-11-15 MED ORDER — GUAIFENESIN ER 600 MG PO TB12: 600.0000 mg | ORAL_TABLET | Freq: Two times a day (BID) | ORAL | 0 refills | Status: AC

## 2017-11-15 MED ORDER — HYDRALAZINE HCL 25 MG PO TABS
25.0000 mg | ORAL_TABLET | Freq: Three times a day (TID) | ORAL | 2 refills | Status: AC
Start: 2017-11-15 — End: ?

## 2017-11-15 MED ORDER — DIVALPROEX SODIUM 125 MG PO CSDR: DELAYED_RELEASE_CAPSULE | ORAL | 1 refills | Status: AC

## 2017-11-15 MED ORDER — INSULIN ASPART 100 UNIT/ML ~~LOC~~ SOLN: 0.0000 [IU] | Freq: Three times a day (TID) | SUBCUTANEOUS | 11 refills | Status: AC

## 2017-11-15 MED ORDER — PREDNISONE 20 MG PO TABS: 20.0000 mg | ORAL_TABLET | Freq: Every day | ORAL | 0 refills | Status: AC

## 2017-11-15 MED ORDER — HYDRALAZINE HCL 20 MG/ML IJ SOLN
10.0000 mg | Freq: Four times a day (QID) | INTRAMUSCULAR | Status: DC | PRN
  Administered 2017-11-15: 10 mg via INTRAVENOUS
  Filled 2017-11-15: qty 1

## 2017-11-15 MED ORDER — IPRATROPIUM-ALBUTEROL 0.5-2.5 (3) MG/3ML IN SOLN: 3.0000 mL | Freq: Three times a day (TID) | RESPIRATORY_TRACT | 1 refills | Status: AC

## 2017-11-15 MED ORDER — SENNOSIDES-DOCUSATE SODIUM 8.6-50 MG PO TABS: 2.0000 | ORAL_TABLET | Freq: Every day | ORAL | 1 refills | Status: DC

## 2017-11-15 MED ORDER — TRAMADOL HCL 50 MG PO TABS: 25.0000 mg | ORAL_TABLET | Freq: Two times a day (BID) | ORAL | 0 refills | Status: DC | PRN

## 2017-11-15 NOTE — Progress Notes (Signed)
Patient to return to ALF, ordered home nebulizer to be delivered to room prior to discharge.

## 2017-11-15 NOTE — Discharge Instructions (Signed)
1)You have Flareup of Bronchitis-due to Parainfluenza type III infection--- Prednisone and breathing treatments as ordered 2) use continuous oxygen as prescribed 3)Blood sugars may run slightly higher due to steroids 4)Very low-salt diet advised 5)Weigh yourself daily, call if you gain more than 3 pounds in 1 day or more than 5 pounds in 1 week as your diuretic medications may need to be adjusted 6)Limit your Fluid  intake to no more than 60 ounces (1.8 Liters) per day

## 2017-11-15 NOTE — Progress Notes (Signed)
Nutrition Brief Note  RD consulted as part of the COPD GOLD protocol.  84 year old female who presented to the ED from [facility] with cough. PMH significant for type 2 diabetes mellitus, hypertension, CKD stage IV, endometrial cancer s/p radiation therapy, and CVA.  RD spoke with pt at bedside who reports having a good appetite and eating 3 meals daily. Pt states that her meals are "solid meals." Pt voicing concerns with her dentures at time of visit. RD noted dentures not fitting correctly. Alerted RN.  Pt states that a typical breakfast includes scrambled eggs, grits, toast, and applesauce and that she "eats all of it."  Pt states that she loses weight "off and on." Pt expalins, "I lose some, then gain some back." Pt reports her UBW as 120 lbs. RD obtained bed weight at time of visit: 126.5 lbs. Per weight history, pt has maintained her weight between 114-126 lbs since 2013.  RD completed NFPE. Evidence of mild muscle depletion in temple area, calves, and thighs. Depletions in bilateral lower extremities likely related to pt using a wheelchair at baseline. No evidence of subcuatneous fat depletion.   Wt Readings from Last 15 Encounters:  11/14/17 125 lb (56.7 kg)  07/23/17 121 lb 11.1 oz (55.2 kg)  05/19/15 120 lb (54.4 kg)  07/31/13 119 lb 4.3 oz (54.1 kg)  07/20/13 126 lb 12.2 oz (57.5 kg)  06/09/12 114 lb (51.7 kg)  12/12/11 115 lb (52.2 kg)  06/11/11 116 lb 8 oz (52.8 kg)    Body mass index is 24.41 kg/m. Patient meets criteria for Normal Weight based on current BMI.   Current diet order is Carb Modified. No meal completion charted at this time. Labs and medications reviewed.   No nutrition interventions warranted at this time. If nutrition issues arise, please consult RD.   Gaynell Face, MS, RD, LDN Pager: [phone] Weekend/After Hours: [phone]

## 2017-11-15 NOTE — Progress Notes (Signed)
Pt is from [facility].  Confirmed with patient and facility that plan is for pt to return to [facility] and that they can managed her needs- DC paperwork sent to facility and approved for pt return.  Patient will discharge to [facility] Anticipated discharge date: 7/5 Family notified: left message for pt son and dtr Transportation by [employer]- called at 1:50pm  CSW signing off.  Jorge Ny, LCSW Clinical Social Worker [phone]

## 2017-11-15 NOTE — Evaluation (Signed)
Physical Therapy Evaluation Patient Details Name: [name] MRN: [id number] DOB: [birth year] Today's Date: 11/15/2017   History of Present Illness  84 y.o. female with medical history significant of CVA; stage IV CKD; DM; endometrial CA s/p radiation therapy; and HTN presenting with cough and SOB. CXR unremarkable.   Clinical Impression  PTA pt required assist for transfers to w/c where she was able to utilize RUE/LE for mobilization to dining room, working with PT to regain ambulation, requires assist for bathing and dressing. Pt currently limited by fatigue and decreased strength and L-sided weakness from prior CVA. Pt currently min/mod A for bed mobility. Able to sit EoB for 8 minutes where she performed her LE exercises before becoming fatigued and requesting to transfer back to bed. Pt refused OOB because she did not want to don AFO. PT will continue to follow acutely to maintain strength. D/c back to [facility] when medically appropriate.     Follow Up Recommendations SNF(continue PT working towards ambulation goal)    Equipment Recommendations  None recommended by PT    Recommendations for Other Services       Precautions / Restrictions Precautions Precautions: Fall Restrictions Weight Bearing Restrictions: No      Mobility  Bed Mobility Overal bed mobility: Needs Assistance Bed Mobility: Supine to Sit;Sit to Supine     Supine to sit: Mod assist Sit to supine: Min assist   General bed mobility comments: modA for supine to sit with HoB elevated for trunk to upright pt able to manage LE off bed and reach across body with R UE to pull to EoB, assist for trunk to upright, sit to supine min A for LE management in to bed and scooting up in bed  Transfers                 General transfer comment: pt did not want to attempt secondary to donning AFO          Balance Overall balance assessment: Needs assistance Sitting-balance support: No upper extremity  supported;Bilateral upper extremity supported;Single extremity supported;Feet unsupported Sitting balance-Leahy Scale: Fair Sitting balance - Comments: requires stabilizing assist for performance of Long Arc                                      Pertinent Vitals/Pain Pain Assessment: No/denies pain    Home Living Family/patient expects to be discharged to:: Skilled nursing facility                      Prior Function Level of Independence: Needs assistance   Gait / Transfers Assistance Needed: needs assist for bed mobility and transfer to w/c, once in w/c able to moilize with use of R UE/LE  ADL's / Homemaking Assistance Needed: assist for bathing and dressing   Comments: working with PT to regain ambulation         Extremity/Trunk Assessment   Upper Extremity Assessment Upper Extremity Assessment: LUE deficits/detail;RUE deficits/detail RUE Deficits / Details: resting tremor in R UE, ROM WFL, strength grossly 3+/5 LUE Deficits / Details: decreased strength and ROM secondary to previous CVA, pt working on AAROM for shoulder flexion when PT entered room LUE Sensation: decreased light touch LUE Coordination: decreased fine motor;decreased gross motor    Lower Extremity Assessment Lower Extremity Assessment: RLE deficits/detail;LLE deficits/detail RLE Deficits / Details: ROM WFL, strength grossly assessed at 3+/5 LLE Deficits /  Details: decreased hip and knee ROM, strength grossly 2+/5, utilizes AFO out of bed for foot drop  LLE Sensation: decreased light touch LLE Coordination: decreased fine motor;decreased gross motor       Communication   Communication: No difficulties  Cognition Arousal/Alertness: Awake/alert Behavior During Therapy: WFL for tasks assessed/performed Overall Cognitive Status: Within Functional Limits for tasks assessed                                        General Comments General comments (skin integrity,  edema, etc.): pt on 2 L O2 via [place] with SaO2 95%O2, HR 75 bpm     Exercises General Exercises - Lower Extremity Ankle Circles/Pumps: AROM;Both;20 reps;Supine Long Arc Quad: AROM;Both;10 reps;Seated Hip Flexion/Marching: AROM;Both;5 reps;Seated   Assessment/Plan    PT Assessment Patient needs continued PT services  PT Problem List Decreased strength;Decreased range of motion;Decreased activity tolerance;Decreased balance;Decreased mobility;Decreased coordination;Cardiopulmonary status limiting activity       PT Treatment Interventions DME instruction;Gait training;Functional mobility training;Therapeutic activities;Therapeutic exercise;Balance training;Cognitive remediation;Patient/family education    PT Goals (Current goals can be found in the Care Plan section)  Acute Rehab PT Goals Patient Stated Goal: get back to [facility] PT Goal Formulation: With patient Time For Goal Achievement: 11/29/17 Potential to Achieve Goals: Fair    Frequency Min 2X/week    AM-PAC PT "6 Clicks" Daily Activity  Outcome Measure Difficulty turning over in bed (including adjusting bedclothes, sheets and blankets)?: A Little Difficulty moving from lying on back to sitting on the side of the bed? : Unable Difficulty sitting down on and standing up from a chair with arms (e.g., wheelchair, bedside commode, etc,.)?: Unable Help needed moving to and from a bed to chair (including a wheelchair)?: A Lot Help needed walking in hospital room?: Total Help needed climbing 3-5 steps with a railing? : Total 6 Click Score: 9    End of Session Equipment Utilized During Treatment: Oxygen Activity Tolerance: Patient tolerated treatment well Patient left: in bed;with call bell/phone within reach;with bed alarm set Nurse Communication: Mobility status PT Visit Diagnosis: Other abnormalities of gait and mobility (R26.89);Muscle weakness (generalized) (M62.81);Difficulty in walking, not elsewhere classified  (R26.2);Hemiplegia and hemiparesis;Other symptoms and signs involving the nervous system (R29.898) Hemiplegia - Right/Left: Left Hemiplegia - dominant/non-dominant: Non-dominant Hemiplegia - caused by: Unspecified    Time: 9892-1194 PT Time Calculation (min) (ACUTE ONLY): 27 min   Charges:   PT Evaluation $PT Eval Moderate Complexity: 1 Mod PT Treatments $Therapeutic Exercise: 8-22 mins   PT G Codes:        Shanard Treto B. Migdalia Dk PT, DPT Acute Rehabilitation  [phone] Pager [phone]    [place] 11/15/2017, 11:21 AM

## 2017-11-15 NOTE — Progress Notes (Signed)
OT Cancellation Note  Patient Details Name: MARLICIA SROKA MRN: [id number] DOB: 06-11-1928   Cancelled Treatment:    Reason Eval/Treat Not Completed: Other (comment). Pt from [facility] and plan is to return to SNF. All OT to be addressed at SNF. If OT eval needed, please contact [phone]. Thanks.  Ramond Dial, OT/L  OT Clinical Specialist [phone]  11/15/2017, 8:06 AM

## 2017-11-15 NOTE — Progress Notes (Signed)
C.Bodenheimer, NP made aware of elevated blood pressures. PRN BP orders given.

## 2017-11-15 NOTE — Discharge Summary (Signed)
[name], is a 84 y.o. female  DOB Jun 09, 1928  MRN [id number].  Admission date:  11/14/2017  Admitting Physician  Karmen Bongo, MD  Discharge Date:  11/15/2017   Primary MD  Josetta Huddle, MD  Recommendations for primary care physician for things to follow:   1)You have Flareup of Bronchitis-due to Parainfluenza type III infection--- Prednisone and breathing treatments as ordered 2) use continuous oxygen as prescribed 3)Blood sugars may run slightly higher due to steroids 4)Very low-salt diet advised 5)Weigh yourself daily, call if you gain more than 3 pounds in 1 day or more than 5 pounds in 1 week as your diuretic medications may need to be adjusted 6)Limit your Fluid  intake to no more than 60 ounces (1.8 Liters) per day   Admission Diagnosis  Shortness of breath [R06.02] Cough [R05]   Discharge Diagnosis  Shortness of breath [R06.02] Cough [R05]    Principal Problem:   Acute on chronic respiratory failure with hypoxia (HCC) Active Problems:   Anemia   Hypertension   CKD (chronic kidney disease), stage IV ([place])      Past Medical History:  Diagnosis Date  . Cancer ([place]) 07/04/10   endometrial  . Diabetes mellitus   . Falling episodes   . Gout   . Hx of radiation therapy 09/13/10- 10/19/10, 6/12, 6/19, 11/14/10   pelvis, external beam tehn brachytherapy  . Hypertension   . Pneumonia   . Renal disorder   . Stroke [facility])    L sided deficits w/slurred speech    Past Surgical History:  Procedure Laterality Date  . ABDOMINAL HYSTERECTOMY         HPI  from the history and physical done on the day of admission:   Chief Complaint: Cough, SOB  HPI: [name] is a 84 y.o. female with medical history significant of CVA; stage IV CKD; DM; endometrial CA s/p radiation therapy; and HTN presenting with cough and SOB.  Hoarseness, and I can't breathe good.  Symptoms for about 4 days.   She thinks she had something like the croup.  No fevers.  +cough, nonproductive.  She usually wears O2 qhs but she has been wearing it 24/7.   ED Course:   Poor air movement - Duoneb and Albuterol somewhat improved.  O2 sats after nebs 86%.  She wears O2 at night but not usually during the day - has needed 24/7 recently.  +conversational dyspnea at first, now better.  Comfortable on home O2.  CXR unremarkable.  ?viral infection.    Hospital Course:    1)Acute on chronic respiratory failure with hypoxia--- due to acute bronchitis in the setting of parainfluenza type III infection, much improved, respiratory panel with parainfluenza virus type III, okay to discharge  on prednisone, bronchodilators and mucolytics, as well as supplemental oxygen.  Procalcitonin is not elevated, chest x-ray without infiltrate.  No further antibiotics indicated at this time  2)HFpEF--- dCHF--- history of chronic CHF last known EF 65 to 70% echo showed grade 2 diastolic  dysfunction in March 2018, chest x-ray without overt CHF type findings, clinical exam without significant CHF type findings, however proBNP is slightly elevated beyond the usual baseline, diuretics as ordered, daily weights and low-salt diet advised.  No ACS  3)CKD IV--- renal function appears to be close to baseline, avoid nephrotoxic agents, continue diuretics  4) chronic Anemia- -Appears to be stable, -Likely associated with chronic kidney disease  5)HTN-stable,-Continue bisoprolol; hydralazine  6)DM 2- y recent A1c 6.5, indicating appropriate control, continue Tradjenta and Levemir, anticipate modest degree of hyperglycemia due to steroid use as above #1, Use Novolog/Humalog Sliding scale insulin with Accu-Cheks/Fingersticks as ordered   7)Mood Disorder----stable, Depakote dose adjusted, serum Depakote levels were low,  8)Generalized weakness/debility--- PT eval appreciated recommend skilled nursing facility rehab  Code Status:  DNR -  confirmed with patient  Disposition Plan:  back to SNF rehab Consults called: CM/SW/PT/OT/Nutrition/RT   Discharge Condition: stable  Diet and Activity recommendation:  As advised  Discharge Instructions     Discharge Instructions    (HEART FAILURE PATIENTS) Call MD:  Anytime you have any of the following symptoms: 1) 3 pound weight gain in 24 hours or 5 pounds in 1 week 2) shortness of breath, with or without a dry hacking cough 3) swelling in the hands, feet or stomach 4) if you have to sleep on extra pillows at night in order to breathe.   Complete by:  As directed    Call MD for:  difficulty breathing, headache or visual disturbances   Complete by:  As directed    Call MD for:  persistant dizziness or light-headedness   Complete by:  As directed    Call MD for:  persistant nausea and vomiting   Complete by:  As directed    Call MD for:  severe uncontrolled pain   Complete by:  As directed    Call MD for:  temperature >100.4   Complete by:  As directed    Diet - low sodium heart healthy   Complete by:  As directed    Discharge instructions   Complete by:  As directed    1)You have Flareup of Bronchitis-due to Parainfluenza type III infection--- Prednisone and breathing treatments as ordered 2) use continuous oxygen as prescribed 3)Blood sugars may run slightly higher due to steroids 4)Very low-salt diet advised 5)Weigh yourself daily, call if you gain more than 3 pounds in 1 day or more than 5 pounds in 1 week as your diuretic medications may need to be adjusted 6)Limit your Fluid  intake to no more than 60 ounces (1.8 Liters) per day   Increase activity slowly   Complete by:  As directed    Per Physical Therapy        Discharge Medications     Allergies as of 11/15/2017      Reactions   Penicillins Other (See Comments)   unknown      Medication List    TAKE these medications   acetaminophen 325 MG tablet Commonly known as:  TYLENOL Take 650 mg by mouth every  8 (eight) hours as needed for moderate pain.   acetaminophen 325 MG tablet Commonly known as:  TYLENOL Take by mouth 3 (three) times daily.   allopurinol 300 MG tablet Commonly known as:  ZYLOPRIM Take 150 mg by mouth at bedtime.   BIOFREEZE ROLL-ON 4 % Gel Generic drug:  Menthol (Topical Analgesic) Apply 1 application topically 2 (two) times daily.   bisoprolol 10 MG tablet Commonly  known as:  ZEBETA Take 1 tablet (10 mg total) by mouth daily.   DEEP SEA NASAL SPRAY 0.65 % nasal spray Generic drug:  sodium chloride Place 1 spray into the nose 4 (four) times daily as needed for congestion.   Dextromethorphan-guaiFENesin 10-100 MG/5ML liquid Take 10 mLs by mouth every 4 (four) hours as needed (cough).   divalproex 125 MG capsule Commonly known as:  DEPAKOTE SPRINKLE Take 375 mg by mouth at bedtime. What changed:  Another medication with the same name was changed. Make sure you understand how and when to take each.   divalproex 125 MG capsule Commonly known as:  DEPAKOTE SPRINKLE 250 mg every morning and 125 mg in the afternoon What changed:    how much to take  how to take this  when to take this  additional instructions   ferrous sulfate 325 (65 FE) MG tablet Take 325 mg by mouth daily with breakfast.   FLUoxetine 20 MG tablet Commonly known as:  PROZAC Take 30 mg by mouth daily.   fluticasone 50 MCG/ACT nasal spray Commonly known as:  FLONASE Place 1 spray into both nostrils daily.   gabapentin 400 MG capsule Commonly known as:  NEURONTIN Take 400 mg by mouth at bedtime.   guaiFENesin 600 MG 12 hr tablet Commonly known as:  MUCINEX Take 1 tablet (600 mg total) by mouth 2 (two) times daily for 10 days.   HEMORRHOIDAL MAX STR RE Place 1 application rectally 2 (two) times daily as needed (for rectal pain).   HM VITAMIN D3 4000 units Caps Generic drug:  Cholecalciferol Take 8,000 Units by mouth daily.   hydrALAZINE 25 MG tablet Commonly known as:   APRESOLINE Take 1 tablet (25 mg total) by mouth 3 (three) times daily. What changed:  when to take this   insulin aspart 100 UNIT/ML injection Commonly known as:  novoLOG Inject 0-15 Units into the skin 3 (three) times daily with meals.   ipratropium-albuterol 0.5-2.5 (3) MG/3ML Soln Commonly known as:  DUONEB Take 3 mLs by nebulization 3 (three) times daily.   LEVEMIR FLEXTOUCH 100 UNIT/ML Pen Generic drug:  Insulin Detemir Inject 45 Units into the skin daily.   linagliptin 5 MG Tabs tablet Commonly known as:  TRADJENTA Take 5 mg by mouth daily.   magnesium oxide 400 MG tablet Commonly known as:  MAG-OX Take 400 mg by mouth daily.   Olopatadine HCl 0.2 % Soln Place 1 drop into both eyes daily.   OXYGEN Inhale 2 L into the lungs at bedtime.   pantoprazole 20 MG tablet Commonly known as:  PROTONIX Take 20 mg by mouth daily.   predniSONE 20 MG tablet Commonly known as:  DELTASONE Take 1 tablet (20 mg total) by mouth daily with breakfast.   rOPINIRole 1 MG tablet Commonly known as:  REQUIP Take 1 mg by mouth 2 (two) times daily.   senna-docusate 8.6-50 MG tablet Commonly known as:  Senokot-S Take 2 tablets by mouth at bedtime.   simethicone 80 MG chewable tablet Commonly known as:  MYLICON Chew 80 mg by mouth 3 (three) times daily as needed for flatulence (and bloating).   tetrahydrozoline 0.05 % ophthalmic solution Place 2 drops into both eyes 2 (two) times daily.   torsemide 20 MG tablet Commonly known as:  DEMADEX Take 2 tablets (40 mg total) by mouth daily.   traMADol 50 MG tablet Commonly known as:  ULTRAM Take 0.5 tablets (25 mg total) by mouth every 12 (twelve) hours  as needed for moderate pain. What changed:  Another medication with the same name was removed. Continue taking this medication, and follow the directions you see here.   traZODone 50 MG tablet Commonly known as:  DESYREL Take 50 mg by mouth at bedtime.   VITAMIN B 12 PO Take 1 tablet  by mouth daily.       Major procedures and Radiology Reports - PLEASE review detailed and final reports for all details, in brief -   Dg Chest 2 View  Result Date: 11/14/2017 CLINICAL DATA:  Cough and shortness of breath for several days EXAM: CHEST - 2 VIEW COMPARISON:  07/19/2017 FINDINGS: The heart size and mediastinal contours are within normal limits. Both lungs are clear. The visualized skeletal structures are unremarkable. IMPRESSION: No acute abnormality noted. Electronically Signed   By: Inez Catalina M.D.   On: 11/14/2017 10:48    Micro Results    Recent Results (from the past 240 hour(s))  Respiratory Panel by PCR     Status: Abnormal   Collection Time: 11/15/17  1:13 AM  Result Value Ref Range Status   Adenovirus NOT DETECTED NOT DETECTED Final   Coronavirus 229E NOT DETECTED NOT DETECTED Final   Coronavirus HKU1 NOT DETECTED NOT DETECTED Final   Coronavirus NL63 NOT DETECTED NOT DETECTED Final   Coronavirus OC43 NOT DETECTED NOT DETECTED Final   Metapneumovirus NOT DETECTED NOT DETECTED Final   Rhinovirus / Enterovirus NOT DETECTED NOT DETECTED Final   Influenza A NOT DETECTED NOT DETECTED Final   Influenza B NOT DETECTED NOT DETECTED Final   Parainfluenza Virus 1 NOT DETECTED NOT DETECTED Final   Parainfluenza Virus 2 NOT DETECTED NOT DETECTED Final   Parainfluenza Virus 3 DETECTED (A) NOT DETECTED Final   Parainfluenza Virus 4 NOT DETECTED NOT DETECTED Final   Respiratory Syncytial Virus NOT DETECTED NOT DETECTED Final   Bordetella pertussis NOT DETECTED NOT DETECTED Final   Chlamydophila pneumoniae NOT DETECTED NOT DETECTED Final   Mycoplasma pneumoniae NOT DETECTED NOT DETECTED Final       Today   Subjective    [name] today has no new complaints, patient states her breathing is much better, she is eating and drinking well, eager to go back to rehab facility, RN at bedside          Patient has been seen and examined prior to discharge   Objective    Blood pressure (!) 161/61, pulse 79, temperature 98.1 F (36.7 C), temperature source Oral, resp. rate 20, height 5' (1.524 m), weight 56.7 kg (125 lb), SpO2 95 %.   Intake/Output Summary (Last 24 hours) at 11/15/2017 1227 Last data filed at 11/14/2017 2148 Gross per 24 hour  Intake 120 ml  Output -  Net 120 ml    Exam Gen:- Awake Alert,  In no apparent distress, speaking in complete sentences HEENT:- [place].AT, No sclera icterus Neck-Supple Neck,No JVD,.  Lungs-no wheezing, few scattered rhonchi, fairly symmetrical air movement CV- S1, S2 normal Abd-  +ve B.Sounds, Abd Soft, No tenderness,    Extremity/Skin:- No  edema,   good pulses Psych-affect is appropriate, oriented x3 Neuro-generalized weakness without new focal deficits ,no new focal deficits, no tremors   Data Review   CBC w Diff:  Lab Results  Component Value Date   WBC 10.9 (H) 11/15/2017   HGB 8.2 (L) 11/15/2017   HCT 26.6 (L) 11/15/2017   PLT 180 11/15/2017   LYMPHOPCT 27 11/14/2017   MONOPCT 9 11/14/2017  EOSPCT 2 11/14/2017   BASOPCT 0 11/14/2017    CMP:  Lab Results  Component Value Date   NA 135 11/15/2017   K 4.5 11/15/2017   CL 93 (L) 11/15/2017   CO2 30 11/15/2017   BUN 42 (H) 11/15/2017   CREATININE 2.54 (H) 11/15/2017   PROT 6.8 11/14/2017   ALBUMIN 3.3 (L) 11/14/2017   BILITOT 0.5 11/14/2017   ALKPHOS 35 (L) 11/14/2017   AST 14 (L) 11/14/2017   ALT 12 11/14/2017   Total Discharge time is about 33 minutes  Roxan Hockey M.D on 11/15/2017 at 12:27 PM  [facility]   Office  [phone]  Voice Recognition Viviann Spare dictation system was used to create this note, attempts have been made to correct errors. Please contact the author with questions and/or clarifications.

## 2017-11-15 NOTE — NC FL2 (Addendum)
[place] LEVEL OF CARE SCREENING TOOL     IDENTIFICATION  Patient Name: [name] Birthdate: [birth year] Sex: female Admission Date (Current Location): 11/14/2017  [facility] and [place] Number:  Herbalist and Address:  [facility] [place], [place] [address] [place], [place] [address]      Provider Number: [id number]  Attending Physician Name and Address:  Roxan Hockey, MD  Relative Name and Phone Number:       Current Level of Care: Hospital Recommended Level of Care: [facility] Prior Approval Number:    Date Approved/Denied:   PASRR Number:    Discharge Plan: Other (Comment)(ALF)    Current Diagnoses: Patient Active Problem List   Diagnosis Date Noted  . Acute on chronic respiratory failure with hypoxia ([place]) 11/14/2017  . CKD (chronic kidney disease), stage IV ([place]) 07/23/2017  . Edema 07/20/2017  . Shortness of breath 07/20/2017  . Gastroesophageal reflux disease   . Hyperlipidemia   . AKI (acute kidney injury) ([place]) 07/19/2017  . Acute respiratory failure ([place]) 07/30/2013  . GI bleed 07/29/2013  . Anemia associated with acute blood loss 07/29/2013  . Diarrhea 07/21/2013  . Renal failure, acute on chronic (HCC) 07/20/2013  . Dehydration with hyponatremia 07/20/2013  . Anemia 07/20/2013  . Acute encephalopathy 07/20/2013  . Diabetes mellitus type 2, controlled ([place]) 07/20/2013  . Stroke ([place])   . Hypertension   . Hx of radiation therapy   . Cancer ([place])   . Malignant neoplasm of corpus uteri, except isthmus ([place]) 06/11/2011    Orientation RESPIRATION BLADDER Height & Weight     Self, Situation, Time, Place  O2(2L [place]) Continent Weight: 125 lb (56.7 kg) Height:  5' (152.4 cm)  BEHAVIORAL SYMPTOMS/MOOD NEUROLOGICAL BOWEL NUTRITION STATUS      Continent Diet(regular- no added salt)  AMBULATORY STATUS COMMUNICATION OF NEEDS Skin   Extensive Assist Verbally Normal                        Personal Care Assistance Level of Assistance  Bathing, Dressing Bathing Assistance: Limited assistance   Dressing Assistance: Limited assistance     Functional Limitations Info             SPECIAL CARE FACTORS FREQUENCY  PT (By licensed PT), OT (By licensed OT)     PT Frequency: 3/wk with home health OT Frequency: 3/wk with home health            Contractures      Additional Factors Info  Code Status, Allergies Code Status Info: DNR Allergies Info: Penicillins            Discharge Medications: Medication List    TAKE these medications   acetaminophen 325 MG tablet Commonly known as:  TYLENOL Take 650 mg by mouth every 8 (eight) hours as needed for moderate pain.   acetaminophen 325 MG tablet Commonly known as:  TYLENOL Take by mouth 3 (three) times daily.   allopurinol 300 MG tablet Commonly known as:  ZYLOPRIM Take 150 mg by mouth at bedtime.   BIOFREEZE ROLL-ON 4 % Gel Generic drug:  Menthol (Topical Analgesic) Apply 1 application topically 2 (two) times daily.   bisoprolol 10 MG tablet Commonly known as:  ZEBETA Take 1 tablet (10 mg total) by mouth daily.   DEEP SEA NASAL SPRAY 0.65 % nasal spray Generic drug:  sodium chloride Place 1 spray into the nose 4 (four) times daily as  needed for congestion.   Dextromethorphan-guaiFENesin 10-100 MG/5ML liquid Take 10 mLs by mouth every 4 (four) hours as needed (cough).   divalproex 125 MG capsule Commonly known as:  DEPAKOTE SPRINKLE Take 375 mg by mouth at bedtime. What changed:  Another medication with the same name was changed. Make sure you understand how and when to take each.   divalproex 125 MG capsule Commonly known as:  DEPAKOTE SPRINKLE 250 mg every morning and 125 mg in the afternoon What changed:    how much to take  how to take this  when to take this  additional instructions   ferrous sulfate 325 (65 FE) MG tablet Take 325 mg by mouth daily with  breakfast.   FLUoxetine 20 MG tablet Commonly known as:  PROZAC Take 30 mg by mouth daily.   fluticasone 50 MCG/ACT nasal spray Commonly known as:  FLONASE Place 1 spray into both nostrils daily.   gabapentin 400 MG capsule Commonly known as:  NEURONTIN Take 400 mg by mouth at bedtime.   guaiFENesin 600 MG 12 hr tablet Commonly known as:  MUCINEX Take 1 tablet (600 mg total) by mouth 2 (two) times daily for 10 days.   HEMORRHOIDAL MAX STR RE Place 1 application rectally 2 (two) times daily as needed (for rectal pain).   HM VITAMIN D3 4000 units Caps Generic drug:  Cholecalciferol Take 8,000 Units by mouth daily.   hydrALAZINE 25 MG tablet Commonly known as:  APRESOLINE Take 1 tablet (25 mg total) by mouth 3 (three) times daily. What changed:  when to take this   insulin aspart 100 UNIT/ML injection Commonly known as:  novoLOG Inject 0-15 Units into the skin 3 (three) times daily with meals.   ipratropium-albuterol 0.5-2.5 (3) MG/3ML Soln Commonly known as:  DUONEB Take 3 mLs by nebulization 3 (three) times daily.   LEVEMIR FLEXTOUCH 100 UNIT/ML Pen Generic drug:  Insulin Detemir Inject 45 Units into the skin daily.   linagliptin 5 MG Tabs tablet Commonly known as:  TRADJENTA Take 5 mg by mouth daily.   magnesium oxide 400 MG tablet Commonly known as:  MAG-OX Take 400 mg by mouth daily.   Olopatadine HCl 0.2 % Soln Place 1 drop into both eyes daily.   OXYGEN Inhale 2 L into the lungs at bedtime.   pantoprazole 20 MG tablet Commonly known as:  PROTONIX Take 20 mg by mouth daily.   predniSONE 20 MG tablet Commonly known as:  DELTASONE Take 1 tablet (20 mg total) by mouth daily with breakfast.   rOPINIRole 1 MG tablet Commonly known as:  REQUIP Take 1 mg by mouth 2 (two) times daily.   senna-docusate 8.6-50 MG tablet Commonly known as:  Senokot-S Take 2 tablets by mouth at bedtime.   simethicone 80 MG chewable tablet Commonly  known as:  MYLICON Chew 80 mg by mouth 3 (three) times daily as needed for flatulence (and bloating).   tetrahydrozoline 0.05 % ophthalmic solution Place 2 drops into both eyes 2 (two) times daily.   torsemide 20 MG tablet Commonly known as:  DEMADEX Take 2 tablets (40 mg total) by mouth daily.   traMADol 50 MG tablet Commonly known as:  ULTRAM Take 0.5 tablets (25 mg total) by mouth every 12 (twelve) hours as needed for moderate pain. What changed:  Another medication with the same name was removed. Continue taking this medication, and follow the directions you see here.   traZODone 50 MG tablet Commonly known as:  DESYREL  Take 50 mg by mouth at bedtime.   VITAMIN B 12 PO Take 1 tablet by mouth daily.       Relevant Imaging Results:  Relevant Lab Results:   Additional Information SS# [id number]  Jorge Ny, [place]

## 2017-11-19 ENCOUNTER — Non-Acute Institutional Stay: Payer: Self-pay | Admitting: Hospice and Palliative Medicine

## 2017-11-19 DIAGNOSIS — Z515 Encounter for palliative care: Secondary | ICD-10-CM

## 2017-11-20 NOTE — Progress Notes (Signed)
PALLIATIVE CARE CONSULT VISIT   PATIENT NAME: [name] DOB: [birth year] MRN: [id number]  PRIMARY CARE PROVIDER:   Josetta Huddle, MD  REFERRING PROVIDER:  Ferd Hibbs, NP  RESPONSIBLE PARTY:   Self/ Granville Lewis [phone]  ASSESSMENT:     Patient is comfortable appearing and mostly seems to be back to her previous baseline. Oral intake is good. Breathing appears stable without noted hypoxia during the day. Staff are using nebs.   In the past, patient has been fairly candid about her health and expectation of future decline. Her previous hospitalization was for A/CKD and she declined further follow up and would not want hemodialysis. Today, patient brought up my previous offer to complete a MOST form. However, she asked was "it completely necessary" given that she is a DNR and has made her wishes clear that she would not want any life prolonging measures. I explained the benefit of the form but will hold on completing it for now.   RECOMMENDATIONS and PLAN:  1. Continue supportive care. Consider hospice in the setting of decline.   I spent 15 minutes providing this consultation,  from 1400 to 1415. More than 50% of the time in this consultation was spent coordinating communication.   HISTORY OF PRESENT ILLNESS:  [name] is an 84 yo woman with multiple medical problems including h/o CVA, CKD IV, h/o GI bleed, and remote h/o endometrial cancer s/p XRT, who was recently hospitalized 11/14/17 to 11/15/17 with bronchitis. She returned to ALF and palliative care was asked to follow. We have followed her in the past.   CODE STATUS: DNR  PPS: 40% HOSPICE ELIGIBILITY/DIAGNOSIS: TBD  PAST MEDICAL HISTORY:  Past Medical History:  Diagnosis Date  . Cancer ([place]) 07/04/10   endometrial  . Diabetes mellitus   . Falling episodes   . Gout   . Hx of radiation therapy 09/13/10- 10/19/10, 6/12, 6/19, 11/14/10   pelvis, external beam tehn brachytherapy  . Hypertension   . Pneumonia   .  Renal disorder   . Stroke [facility])    L sided deficits w/slurred speech    SOCIAL HX:  Social History   Tobacco Use  . Smoking status: Former Smoker    Types: Cigarettes  . Smokeless tobacco: Never Used  . Tobacco comment: quit many yrs ago  Substance Use Topics  . Alcohol use: No    ALLERGIES:  Allergies  Allergen Reactions  . Penicillins Other (See Comments)    unknown     PERTINENT MEDICATIONS:  Outpatient Encounter Medications as of 11/19/2017  Medication Sig  . acetaminophen (TYLENOL) 325 MG tablet Take 650 mg by mouth every 8 (eight) hours as needed for moderate pain.  Marland Kitchen acetaminophen (TYLENOL) 325 MG tablet Take by mouth 3 (three) times daily.  Marland Kitchen allopurinol (ZYLOPRIM) 300 MG tablet Take 150 mg by mouth at bedtime.  . bisoprolol (ZEBETA) 10 MG tablet Take 1 tablet (10 mg total) by mouth daily.  . Cholecalciferol (HM VITAMIN D3) 4000 UNITS CAPS Take 8,000 Units by mouth daily.  . Cyanocobalamin (VITAMIN B 12 PO) Take 1 tablet by mouth daily.   Marland Kitchen Dextromethorphan-guaiFENesin 10-100 MG/5ML liquid Take 10 mLs by mouth every 4 (four) hours as needed (cough).  . divalproex (DEPAKOTE SPRINKLE) 125 MG capsule Take 375 mg by mouth at bedtime.  . divalproex (DEPAKOTE SPRINKLE) 125 MG capsule 250 mg every morning and 125 mg in the afternoon  . ferrous sulfate 325 (65 FE) MG tablet Take 325  mg by mouth daily with breakfast.  . FLUoxetine (PROZAC) 20 MG tablet Take 30 mg by mouth daily.  . fluticasone (FLONASE) 50 MCG/ACT nasal spray Place 1 spray into both nostrils daily.  Marland Kitchen gabapentin (NEURONTIN) 400 MG capsule Take 400 mg by mouth at bedtime.  Marland Kitchen guaiFENesin (MUCINEX) 600 MG 12 hr tablet Take 1 tablet (600 mg total) by mouth 2 (two) times daily for 10 days.  . hydrALAZINE (APRESOLINE) 25 MG tablet Take 1 tablet (25 mg total) by mouth 3 (three) times daily.  . insulin aspart (NOVOLOG) 100 UNIT/ML injection Inject 0-15 Units into the skin 3 (three) times daily with meals.  Marland Kitchen  ipratropium-albuterol (DUONEB) 0.5-2.5 (3) MG/3ML SOLN Take 3 mLs by nebulization 3 (three) times daily.  Marland Kitchen LEVEMIR FLEXTOUCH 100 UNIT/ML Pen Inject 45 Units into the skin daily.  Marland Kitchen linagliptin (TRADJENTA) 5 MG TABS tablet Take 5 mg by mouth daily.  . magnesium oxide (MAG-OX) 400 MG tablet Take 400 mg by mouth daily.  . Menthol, Topical Analgesic, (BIOFREEZE ROLL-ON) 4 % GEL Apply 1 application topically 2 (two) times daily.  . Olopatadine HCl 0.2 % SOLN Place 1 drop into both eyes daily.  . OXYGEN Inhale 2 L into the lungs at bedtime.  . pantoprazole (PROTONIX) 20 MG tablet Take 20 mg by mouth daily.  . Pramox-PE-Glycerin-Petrolatum (HEMORRHOIDAL MAX STR RE) Place 1 application rectally 2 (two) times daily as needed (for rectal pain).  . predniSONE (DELTASONE) 20 MG tablet Take 1 tablet (20 mg total) by mouth daily with breakfast.  . rOPINIRole (REQUIP) 1 MG tablet Take 1 mg by mouth 2 (two) times daily.  Marland Kitchen senna-docusate (SENOKOT-S) 8.6-50 MG tablet Take 2 tablets by mouth at bedtime.  . simethicone (MYLICON) 80 MG chewable tablet Chew 80 mg by mouth 3 (three) times daily as needed for flatulence (and bloating).  . sodium chloride (DEEP SEA NASAL SPRAY) 0.65 % nasal spray Place 1 spray into the nose 4 (four) times daily as needed for congestion.   Marland Kitchen tetrahydrozoline 0.05 % ophthalmic solution Place 2 drops into both eyes 2 (two) times daily.  Marland Kitchen torsemide (DEMADEX) 20 MG tablet Take 2 tablets (40 mg total) by mouth daily.  . traMADol (ULTRAM) 50 MG tablet Take 0.5 tablets (25 mg total) by mouth every 12 (twelve) hours as needed for moderate pain.  . traZODone (DESYREL) 50 MG tablet Take 50 mg by mouth at bedtime.   No facility-administered encounter medications on file as of 11/19/2017.     PHYSICAL EXAM:   General: NAD, frail appearing, thin, sitting in wheelchair Cardiovascular: regular Pulmonary: coarse with some exp wheezing, not on O2 Abdomen: soft, nontender, + bowel  sounds Extremities: no edema Skin: no rashes Neurological: Weakness but otherwise nonfocal  Irean Hong, NP

## 2018-02-21 ENCOUNTER — Non-Acute Institutional Stay: Payer: Medicare Other | Admitting: Primary Care

## 2018-02-21 DIAGNOSIS — I69993 Ataxia following unspecified cerebrovascular disease: Secondary | ICD-10-CM

## 2018-02-21 DIAGNOSIS — Z794 Long term (current) use of insulin: Secondary | ICD-10-CM

## 2018-02-21 DIAGNOSIS — N184 Chronic kidney disease, stage 4 (severe): Secondary | ICD-10-CM

## 2018-02-21 DIAGNOSIS — N289 Disorder of kidney and ureter, unspecified: Secondary | ICD-10-CM

## 2018-02-21 DIAGNOSIS — Z515 Encounter for palliative care: Secondary | ICD-10-CM

## 2018-02-21 DIAGNOSIS — E1122 Type 2 diabetes mellitus with diabetic chronic kidney disease: Secondary | ICD-10-CM

## 2018-02-21 NOTE — Progress Notes (Signed)
PALLIATIVE CARE CONSULT VISIT   PATIENT NAME: [name] DOB: 10-22-1928 MRN: [id number]  PRIMARY CARE PROVIDER:   Josetta Huddle, MD  REFERRING PROVIDER:  Josetta Huddle, MD [address] [place], [place] [address]  PRIMARY CARE PROVIDER:   Josetta Huddle, MD  REFERRING PROVIDER:  Ferd Hibbs, NP  RESPONSIBLE PARTY:   Self/ Granville Lewis [phone]  ASSESSMENT:      General: Patient is comfortable appearing and seems at baseline. Oral intake is good. Breathing appears stable with Nebulizers as needed, at the bedside. It is not labored at rest but she becomes slightly SOB when talking.  Goal for care planning: Patient outlined that she wanted care at the facility. She stated her children lived some distance away and she felt capable to make her care decisions. States she's sleeping "too much" and eating most of food but feels well.   Weakness/ safety  Denies falls and has assistance to transfer to chair, bathroom, and knows to call for it. Left sided brace  and right foot drop noted.   RECOMMENDATIONS and PLAN:   Genearal. Continue supportive care. Consider hospice in the case of decline in function. Review any medications that may contribute to somnolence.  Weakness: Foot rest for wheel chair: Right foot needs support so foot drop does not worsen.   Return 2 months for follow up and review of goals of care.  I spent 25 minutes providing this consultation,  from 1100 to 1125. More than 50% of the time in this consultation was spent coordinating communication.   HISTORY OF PRESENT ILLNESS:  [name] is an 84 yo woman with multiple medical problems including h/o CVA,HTN,  CKD IV, h/o GI bleed, h/o respiratory failure. Palliative care was asked to help address goals of care.  CODE STATUS: DNR PPS: 40% HOSPICE ELIGIBILITY/DIAGNOSIS: TBD  PAST MEDICAL HISTORY:  Past Medical History:  Diagnosis Date  . Cancer ([place]) 07/04/10   endometrial  .  Diabetes mellitus   . Falling episodes   . Gout   . Hx of radiation therapy 09/13/10- 10/19/10, 6/12, 6/19, 11/14/10   pelvis, external beam tehn brachytherapy  . Hypertension   . Pneumonia   . Renal disorder   . Stroke [facility])    L sided deficits w/slurred speech    SOCIAL HX:  Social History   Tobacco Use  . Smoking status: Former Smoker    Types: Cigarettes  . Smokeless tobacco: Never Used  . Tobacco comment: quit many yrs ago  Substance Use Topics  . Alcohol use: No    ALLERGIES:  Allergies  Allergen Reactions  . Penicillins Other (See Comments)    unknown     PERTINENT MEDICATIONS:  Outpatient Encounter Medications as of 02/21/2018  Medication Sig  . acetaminophen (TYLENOL) 325 MG tablet Take 650 mg by mouth every 8 (eight) hours as needed for moderate pain.  Marland Kitchen acetaminophen (TYLENOL) 325 MG tablet Take by mouth 3 (three) times daily.  Marland Kitchen allopurinol (ZYLOPRIM) 300 MG tablet Take 150 mg by mouth at bedtime.  . bisoprolol (ZEBETA) 10 MG tablet Take 1 tablet (10 mg total) by mouth daily.  . Cholecalciferol (HM VITAMIN D3) 4000 UNITS CAPS Take 8,000 Units by mouth daily.  . Cyanocobalamin (VITAMIN B 12 PO) Take 1 tablet by mouth daily.   Marland Kitchen Dextromethorphan-guaiFENesin 10-100 MG/5ML liquid Take 10 mLs by mouth every 4 (four) hours as needed (cough).  . divalproex (DEPAKOTE SPRINKLE) 125 MG capsule Take 375  mg by mouth at bedtime.  . divalproex (DEPAKOTE SPRINKLE) 125 MG capsule 250 mg every morning and 125 mg in the afternoon  . ferrous sulfate 325 (65 FE) MG tablet Take 325 mg by mouth daily with breakfast.  . FLUoxetine (PROZAC) 20 MG tablet Take 30 mg by mouth daily.  . fluticasone (FLONASE) 50 MCG/ACT nasal spray Place 1 spray into both nostrils daily.  Marland Kitchen gabapentin (NEURONTIN) 400 MG capsule Take 400 mg by mouth at bedtime.  . hydrALAZINE (APRESOLINE) 25 MG tablet Take 1 tablet (25 mg total) by mouth 3 (three) times daily.  . insulin aspart (NOVOLOG) 100 UNIT/ML injection  Inject 0-15 Units into the skin 3 (three) times daily with meals.  Marland Kitchen ipratropium-albuterol (DUONEB) 0.5-2.5 (3) MG/3ML SOLN Take 3 mLs by nebulization 3 (three) times daily.  Marland Kitchen LEVEMIR FLEXTOUCH 100 UNIT/ML Pen Inject 45 Units into the skin daily.  Marland Kitchen linagliptin (TRADJENTA) 5 MG TABS tablet Take 5 mg by mouth daily.  . magnesium oxide (MAG-OX) 400 MG tablet Take 400 mg by mouth daily.  . Menthol, Topical Analgesic, (BIOFREEZE ROLL-ON) 4 % GEL Apply 1 application topically 2 (two) times daily.  . Olopatadine HCl 0.2 % SOLN Place 1 drop into both eyes daily.  . OXYGEN Inhale 2 L into the lungs at bedtime.  . pantoprazole (PROTONIX) 20 MG tablet Take 20 mg by mouth daily.  . Pramox-PE-Glycerin-Petrolatum (HEMORRHOIDAL MAX STR RE) Place 1 application rectally 2 (two) times daily as needed (for rectal pain).  . predniSONE (DELTASONE) 20 MG tablet Take 1 tablet (20 mg total) by mouth daily with breakfast.  . rOPINIRole (REQUIP) 1 MG tablet Take 1 mg by mouth 2 (two) times daily.  Marland Kitchen senna-docusate (SENOKOT-S) 8.6-50 MG tablet Take 2 tablets by mouth at bedtime.  . simethicone (MYLICON) 80 MG chewable tablet Chew 80 mg by mouth 3 (three) times daily as needed for flatulence (and bloating).  . sodium chloride (DEEP SEA NASAL SPRAY) 0.65 % nasal spray Place 1 spray into the nose 4 (four) times daily as needed for congestion.   Marland Kitchen tetrahydrozoline 0.05 % ophthalmic solution Place 2 drops into both eyes 2 (two) times daily.  Marland Kitchen torsemide (DEMADEX) 20 MG tablet Take 2 tablets (40 mg total) by mouth daily.  . traMADol (ULTRAM) 50 MG tablet Take 0.5 tablets (25 mg total) by mouth every 12 (twelve) hours as needed for moderate pain.  . traZODone (DESYREL) 50 MG tablet Take 50 mg by mouth at bedtime.   No facility-administered encounter medications on file as of 02/21/2018.     PHYSICAL EXAM:   VS 98.6-83-20 154/80 pulse oxygen = 95% General: NAD, frail appearing, interactive. Cardiovascular:S1, S2,  irregular rate Pulmonary:  R lobes clear with good air movement, left lobes with decreased air movement. Uses nebs prn. Extremities: no edema, Left foot/ankle stablizer brace, right foot drop. Skin: no rashes, lesions on gross exam Neurological: Weakness but otherwise nonfocal  Cyndia Skeeters AGPCNP-BC

## 2018-05-09 ENCOUNTER — Non-Acute Institutional Stay: Payer: Medicare Other | Admitting: Primary Care

## 2018-05-09 DIAGNOSIS — R0602 Shortness of breath: Secondary | ICD-10-CM

## 2018-05-09 DIAGNOSIS — J9621 Acute and chronic respiratory failure with hypoxia: Secondary | ICD-10-CM

## 2018-05-09 DIAGNOSIS — N184 Chronic kidney disease, stage 4 (severe): Secondary | ICD-10-CM

## 2018-05-09 DIAGNOSIS — Z515 Encounter for palliative care: Secondary | ICD-10-CM

## 2018-05-09 NOTE — Progress Notes (Signed)
[facility] Telephone: [phone] Fax: [phone]  PATIENT NAME: [name] DOB: July 11, 1928 MRN: [id number]  PRIMARY CARE PROVIDER:   Josetta Huddle, MD  REFERRING PROVIDER:  Josetta Huddle, MD [address] [place], [place] [address]  RESPONSIBLE PARTY:   Extended Emergency Contact Information Primary Emergency Contact: Church,Carl Address: [address]           [place], [place] [address] [place] Phone: [phone] Relation: Son Secondary Emergency Contact: Tamera Reason Address: [address]          [place],  [address] [place] Phone: [phone] Relation: Daughter   ASSESSMENT and  RECOMMENDATIONS:   1. Goals of Care: Son [name] visiting from [place], reviewed MOST an d goals of care which are comfort care.Signed MOST with DNR, Comfort -do not hospitalize, limited abx, iv, no tube feeds, Son took Clinical research associate for his records. Original was given to [employer], SW, for uploading.  2. Dementia: Patient in wheel chair, feeding self lunch, FAST score 6d-e. Eats about 25% of this meal. Son, daughter in law and granddaughter here for visit, pt is conversing with them. Became agitated at who NP was, and confused with another provider. Refused physical exam, remaining visit was spent with son in goals of care discussion.  3. Pill burden: 32 recorded meds in profile. Review for d/c of any that are not giving fair benefit.  Palliative to continue to follow for goals of care, symptom management. Return 2 months or prn.  I spent 25 minutes providing this consultation,  from 1300 to 1325. More than 50% of the time in this consultation was spent coordinating communication.   HISTORY OF PRESENT ILLNESS:  [name] is a 84 y.o. year old female with multiple medical problems including HTN,CAD,  gout,memory loss, DM, . Palliative Care was asked to help address goals of care.   CODE STATUS: DNR, DNI,  comfort  Care, no hospitalizations, Limited abx and fluids, no feeding tube  PPS: 30% HOSPICE ELIGIBILITY/DIAGNOSIS: TBD  PAST MEDICAL HISTORY:  Past Medical History:  Diagnosis Date  . Cancer ([place]) 07/04/10   endometrial  . Diabetes mellitus   . Falling episodes   . Gout   . Hx of radiation therapy 09/13/10- 10/19/10, 6/12, 6/19, 11/14/10   pelvis, external beam tehn brachytherapy  . Hypertension   . Pneumonia   . Renal disorder   . Stroke [facility])    L sided deficits w/slurred speech    SOCIAL HX:  Social History   Tobacco Use  . Smoking status: Former Smoker    Types: Cigarettes  . Smokeless tobacco: Never Used  . Tobacco comment: quit many yrs ago  Substance Use Topics  . Alcohol use: No    ALLERGIES:  Allergies  Allergen Reactions  . Penicillins Other (See Comments)    unknown     PERTINENT MEDICATIONS:  Outpatient Encounter Medications as of 05/09/2018  Medication Sig  . acetaminophen (TYLENOL) 325 MG tablet Take 650 mg by mouth every 8 (eight) hours as needed for moderate pain.  Marland Kitchen acetaminophen (TYLENOL) 325 MG tablet Take by mouth 3 (three) times daily.  Marland Kitchen allopurinol (ZYLOPRIM) 300 MG tablet Take 150 mg by mouth at bedtime.  . bisoprolol (ZEBETA) 10 MG tablet Take 1 tablet (10 mg total) by mouth daily.  . Cholecalciferol (HM VITAMIN D3) 4000 UNITS CAPS Take 8,000 Units by mouth daily.  . Cyanocobalamin (VITAMIN B 12 PO) Take 1  tablet by mouth daily.   Marland Kitchen Dextromethorphan-guaiFENesin 10-100 MG/5ML liquid Take 10 mLs by mouth every 4 (four) hours as needed (cough).  . divalproex (DEPAKOTE SPRINKLE) 125 MG capsule Take 375 mg by mouth at bedtime.  . divalproex (DEPAKOTE SPRINKLE) 125 MG capsule 250 mg every morning and 125 mg in the afternoon  . ferrous sulfate 325 (65 FE) MG tablet Take 325 mg by mouth daily with breakfast.  . FLUoxetine (PROZAC) 20 MG tablet Take 30 mg by mouth daily.  . fluticasone (FLONASE) 50 MCG/ACT nasal spray Place 1 spray into both nostrils  daily.  Marland Kitchen gabapentin (NEURONTIN) 400 MG capsule Take 400 mg by mouth at bedtime.  . hydrALAZINE (APRESOLINE) 25 MG tablet Take 1 tablet (25 mg total) by mouth 3 (three) times daily.  . insulin aspart (NOVOLOG) 100 UNIT/ML injection Inject 0-15 Units into the skin 3 (three) times daily with meals.  Marland Kitchen ipratropium-albuterol (DUONEB) 0.5-2.5 (3) MG/3ML SOLN Take 3 mLs by nebulization 3 (three) times daily.  Marland Kitchen LEVEMIR FLEXTOUCH 100 UNIT/ML Pen Inject 45 Units into the skin daily.  Marland Kitchen linagliptin (TRADJENTA) 5 MG TABS tablet Take 5 mg by mouth daily.  . magnesium oxide (MAG-OX) 400 MG tablet Take 400 mg by mouth daily.  . Menthol, Topical Analgesic, (BIOFREEZE ROLL-ON) 4 % GEL Apply 1 application topically 2 (two) times daily.  . Olopatadine HCl 0.2 % SOLN Place 1 drop into both eyes daily.  . OXYGEN Inhale 2 L into the lungs at bedtime.  . pantoprazole (PROTONIX) 20 MG tablet Take 20 mg by mouth daily.  . Pramox-PE-Glycerin-Petrolatum (HEMORRHOIDAL MAX STR RE) Place 1 application rectally 2 (two) times daily as needed (for rectal pain).  . predniSONE (DELTASONE) 20 MG tablet Take 1 tablet (20 mg total) by mouth daily with breakfast.  . rOPINIRole (REQUIP) 1 MG tablet Take 1 mg by mouth 2 (two) times daily.  Marland Kitchen senna-docusate (SENOKOT-S) 8.6-50 MG tablet Take 2 tablets by mouth at bedtime.  . simethicone (MYLICON) 80 MG chewable tablet Chew 80 mg by mouth 3 (three) times daily as needed for flatulence (and bloating).  . sodium chloride (DEEP SEA NASAL SPRAY) 0.65 % nasal spray Place 1 spray into the nose 4 (four) times daily as needed for congestion.   Marland Kitchen tetrahydrozoline 0.05 % ophthalmic solution Place 2 drops into both eyes 2 (two) times daily.  Marland Kitchen torsemide (DEMADEX) 20 MG tablet Take 2 tablets (40 mg total) by mouth daily.  . traMADol (ULTRAM) 50 MG tablet Take 0.5 tablets (25 mg total) by mouth every 12 (twelve) hours as needed for moderate pain.  . traZODone (DESYREL) 50 MG tablet Take 50 mg by  mouth at bedtime.   No facility-administered encounter medications on file as of 05/09/2018.     PHYSICAL EXAM:  Refused vital signs  General: NAD, frail appearing, WNWD Cardiovascular Denies chest pain, no jvd distention Pulmonary: clear all fields, no cough Abdomen: soft, non-tender, denies constipation Extremities: sl edema, no joint deformities Skin: no rashes or wounds assessed Neurological: Weakness , memory loss  Cyndia Skeeters DNP, AGPCNP-BC

## 2018-07-23 ENCOUNTER — Inpatient Hospital Stay (HOSPITAL_COMMUNITY)
Admission: EM | Admit: 2018-07-23 | Discharge: 2018-07-31 | DRG: 378 | Disposition: A | Discharge status: Skilled Nursing Facility | Payer: Medicare Other | Attending: Internal Medicine | Admitting: Internal Medicine

## 2018-07-23 ENCOUNTER — Emergency Department (HOSPITAL_COMMUNITY): Payer: Medicare Other

## 2018-07-23 ENCOUNTER — Encounter (HOSPITAL_COMMUNITY): Payer: Self-pay | Admitting: Emergency Medicine

## 2018-07-23 ENCOUNTER — Other Ambulatory Visit: Payer: Self-pay

## 2018-07-23 DIAGNOSIS — D539 Nutritional anemia, unspecified: Secondary | ICD-10-CM

## 2018-07-23 DIAGNOSIS — N179 Acute kidney failure, unspecified: Secondary | ICD-10-CM | POA: Diagnosis not present

## 2018-07-23 DIAGNOSIS — K625 Hemorrhage of anus and rectum: Secondary | ICD-10-CM | POA: Diagnosis not present

## 2018-07-23 DIAGNOSIS — N184 Chronic kidney disease, stage 4 (severe): Secondary | ICD-10-CM | POA: Diagnosis present

## 2018-07-23 DIAGNOSIS — Z6832 Body mass index (BMI) 32.0-32.9, adult: Secondary | ICD-10-CM

## 2018-07-23 DIAGNOSIS — E875 Hyperkalemia: Secondary | ICD-10-CM | POA: Diagnosis not present

## 2018-07-23 DIAGNOSIS — F418 Other specified anxiety disorders: Secondary | ICD-10-CM | POA: Diagnosis present

## 2018-07-23 DIAGNOSIS — J9611 Chronic respiratory failure with hypoxia: Secondary | ICD-10-CM | POA: Diagnosis present

## 2018-07-23 DIAGNOSIS — E785 Hyperlipidemia, unspecified: Secondary | ICD-10-CM | POA: Diagnosis present

## 2018-07-23 DIAGNOSIS — Z515 Encounter for palliative care: Secondary | ICD-10-CM | POA: Diagnosis not present

## 2018-07-23 DIAGNOSIS — I130 Hypertensive heart and chronic kidney disease with heart failure and stage 1 through stage 4 chronic kidney disease, or unspecified chronic kidney disease: Secondary | ICD-10-CM | POA: Diagnosis present

## 2018-07-23 DIAGNOSIS — Z9981 Dependence on supplemental oxygen: Secondary | ICD-10-CM

## 2018-07-23 DIAGNOSIS — M109 Gout, unspecified: Secondary | ICD-10-CM | POA: Diagnosis present

## 2018-07-23 DIAGNOSIS — Z9181 History of falling: Secondary | ICD-10-CM

## 2018-07-23 DIAGNOSIS — Z90710 Acquired absence of both cervix and uterus: Secondary | ICD-10-CM

## 2018-07-23 DIAGNOSIS — Z923 Personal history of irradiation: Secondary | ICD-10-CM

## 2018-07-23 DIAGNOSIS — Z66 Do not resuscitate: Secondary | ICD-10-CM | POA: Diagnosis not present

## 2018-07-23 DIAGNOSIS — I700 Atherosclerosis of aorta: Secondary | ICD-10-CM | POA: Diagnosis present

## 2018-07-23 DIAGNOSIS — K5731 Diverticulosis of large intestine without perforation or abscess with bleeding: Principal | ICD-10-CM | POA: Diagnosis present

## 2018-07-23 DIAGNOSIS — Z79899 Other long term (current) drug therapy: Secondary | ICD-10-CM

## 2018-07-23 DIAGNOSIS — Z794 Long term (current) use of insulin: Secondary | ICD-10-CM

## 2018-07-23 DIAGNOSIS — K760 Fatty (change of) liver, not elsewhere classified: Secondary | ICD-10-CM | POA: Diagnosis present

## 2018-07-23 DIAGNOSIS — E1122 Type 2 diabetes mellitus with diabetic chronic kidney disease: Secondary | ICD-10-CM | POA: Diagnosis present

## 2018-07-23 DIAGNOSIS — Z87891 Personal history of nicotine dependence: Secondary | ICD-10-CM

## 2018-07-23 DIAGNOSIS — K219 Gastro-esophageal reflux disease without esophagitis: Secondary | ICD-10-CM | POA: Diagnosis present

## 2018-07-23 DIAGNOSIS — D62 Acute posthemorrhagic anemia: Secondary | ICD-10-CM | POA: Diagnosis present

## 2018-07-23 DIAGNOSIS — Z8542 Personal history of malignant neoplasm of other parts of uterus: Secondary | ICD-10-CM

## 2018-07-23 DIAGNOSIS — K922 Gastrointestinal hemorrhage, unspecified: Secondary | ICD-10-CM | POA: Diagnosis present

## 2018-07-23 DIAGNOSIS — D631 Anemia in chronic kidney disease: Secondary | ICD-10-CM | POA: Diagnosis present

## 2018-07-23 DIAGNOSIS — E669 Obesity, unspecified: Secondary | ICD-10-CM | POA: Diagnosis present

## 2018-07-23 DIAGNOSIS — I5032 Chronic diastolic (congestive) heart failure: Secondary | ICD-10-CM | POA: Diagnosis present

## 2018-07-23 DIAGNOSIS — Z8249 Family history of ischemic heart disease and other diseases of the circulatory system: Secondary | ICD-10-CM

## 2018-07-23 DIAGNOSIS — Z880 Allergy status to penicillin: Secondary | ICD-10-CM

## 2018-07-23 DIAGNOSIS — Z7952 Long term (current) use of systemic steroids: Secondary | ICD-10-CM

## 2018-07-23 DIAGNOSIS — Z7189 Other specified counseling: Secondary | ICD-10-CM

## 2018-07-23 DIAGNOSIS — I69354 Hemiplegia and hemiparesis following cerebral infarction affecting left non-dominant side: Secondary | ICD-10-CM

## 2018-07-23 DIAGNOSIS — G20 Parkinson's disease: Secondary | ICD-10-CM | POA: Diagnosis present

## 2018-07-23 DIAGNOSIS — N950 Postmenopausal bleeding: Secondary | ICD-10-CM

## 2018-07-23 DIAGNOSIS — J449 Chronic obstructive pulmonary disease, unspecified: Secondary | ICD-10-CM | POA: Diagnosis present

## 2018-07-23 HISTORY — DX: Acute and chronic respiratory failure, unspecified whether with hypoxia or hypercapnia: J96.20

## 2018-07-23 HISTORY — DX: Chronic obstructive pulmonary disease, unspecified: J44.9

## 2018-07-23 HISTORY — DX: Parkinson's disease: G20

## 2018-07-23 HISTORY — DX: Anemia, unspecified: D64.9

## 2018-07-23 HISTORY — DX: Unspecified right bundle-branch block: I45.10

## 2018-07-23 HISTORY — DX: Spondylosis without myelopathy or radiculopathy, site unspecified: M47.819

## 2018-07-23 HISTORY — DX: Parkinson's disease without dyskinesia, without mention of fluctuations: G20.A1

## 2018-07-23 HISTORY — DX: Other chronic pain: G89.29

## 2018-07-23 HISTORY — DX: Hemiplegia and hemiparesis following cerebral infarction affecting left non-dominant side: I69.354

## 2018-07-23 HISTORY — DX: Gastro-esophageal reflux disease without esophagitis: K21.9

## 2018-07-23 HISTORY — DX: Dysphagia, unspecified: R13.10

## 2018-07-23 LAB — CBC WITH DIFFERENTIAL/PLATELET
Abs Immature Granulocytes: 0.07 10*3/uL (ref 0.00–0.07)
BASOS PCT: 0 %
Basophils Absolute: 0 10*3/uL (ref 0.0–0.1)
EOS ABS: 0.4 10*3/uL (ref 0.0–0.5)
EOS PCT: 4 %
HCT: 25.7 % — ABNORMAL LOW (ref 36.0–46.0)
Hemoglobin: 7.9 g/dL — ABNORMAL LOW (ref 12.0–15.0)
IMMATURE GRANULOCYTES: 1 %
Lymphocytes Relative: 28 %
Lymphs Abs: 2.8 10*3/uL (ref 0.7–4.0)
MCH: 32.5 pg (ref 26.0–34.0)
MCHC: 30.7 g/dL (ref 30.0–36.0)
MCV: 105.8 fL — AB (ref 80.0–100.0)
Monocytes Absolute: 0.7 10*3/uL (ref 0.1–1.0)
Monocytes Relative: 6 %
NRBC: 0 % (ref 0.0–0.2)
Neutro Abs: 6.1 10*3/uL (ref 1.7–7.7)
Neutrophils Relative %: 61 %
PLATELETS: 288 10*3/uL (ref 150–400)
RBC: 2.43 MIL/uL — ABNORMAL LOW (ref 3.87–5.11)
RDW: 15.1 % (ref 11.5–15.5)
WBC: 10.1 10*3/uL (ref 4.0–10.5)

## 2018-07-23 LAB — BASIC METABOLIC PANEL
Anion gap: 14 (ref 5–15)
BUN: 52 mg/dL — AB (ref 8–23)
CALCIUM: 10 mg/dL (ref 8.9–10.3)
CO2: 24 mmol/L (ref 22–32)
CREATININE: 2.89 mg/dL — AB (ref 0.44–1.00)
Chloride: 95 mmol/L — ABNORMAL LOW (ref 98–111)
GFR calc Af Amer: 16 mL/min — ABNORMAL LOW (ref >60)
GFR, EST NON AFRICAN AMERICAN: 14 mL/min — AB (ref >60)
GLUCOSE: 265 mg/dL — AB (ref 70–99)
Potassium: 4.6 mmol/L (ref 3.5–5.1)
SODIUM: 133 mmol/L — AB (ref 135–145)

## 2018-07-23 LAB — CBG MONITORING, ED
Glucose-Capillary: 196 mg/dL — ABNORMAL HIGH (ref 70–99)
Glucose-Capillary: 199 mg/dL — ABNORMAL HIGH (ref 70–99)

## 2018-07-23 LAB — PREPARE RBC (CROSSMATCH)

## 2018-07-23 LAB — GLUCOSE, CAPILLARY
Glucose-Capillary: 116 mg/dL — ABNORMAL HIGH (ref 70–99)
Glucose-Capillary: 138 mg/dL — ABNORMAL HIGH (ref 70–99)
Glucose-Capillary: 181 mg/dL — ABNORMAL HIGH (ref 70–99)
Glucose-Capillary: 213 mg/dL — ABNORMAL HIGH (ref 70–99)

## 2018-07-23 LAB — PROTIME-INR
INR: 1 (ref 0.8–1.2)
PROTHROMBIN TIME: 12.9 s (ref 11.4–15.2)

## 2018-07-23 LAB — HEMOGLOBIN AND HEMATOCRIT, BLOOD
HCT: 21.4 % — ABNORMAL LOW (ref 36.0–46.0)
HCT: 27.5 % — ABNORMAL LOW (ref 36.0–46.0)
Hemoglobin: 6.8 g/dL — CL (ref 12.0–15.0)
Hemoglobin: 8.8 g/dL — ABNORMAL LOW (ref 12.0–15.0)

## 2018-07-23 MED ORDER — ACETAMINOPHEN 650 MG RE SUPP: 650.0000 mg | Freq: Four times a day (QID) | RECTAL | Status: DC | PRN

## 2018-07-23 MED ORDER — ROPINIROLE HCL 1 MG PO TABS
1.0000 mg | ORAL_TABLET | Freq: Two times a day (BID) | ORAL | Status: DC
  Administered 2018-07-24 – 2018-07-31 (×14): 1 mg via ORAL
  Filled 2018-07-23 (×15): qty 1

## 2018-07-23 MED ORDER — ONDANSETRON HCL 4 MG/2ML IJ SOLN: 4.0000 mg | Freq: Four times a day (QID) | INTRAMUSCULAR | Status: DC | PRN

## 2018-07-23 MED ORDER — SODIUM CHLORIDE 0.9% IV SOLUTION
Freq: Once | INTRAVENOUS | Status: AC
  Administered 2018-07-23: 13:00:00 via INTRAVENOUS

## 2018-07-23 MED ORDER — TRAZODONE HCL 50 MG PO TABS
50.0000 mg | ORAL_TABLET | Freq: Every day | ORAL | Status: DC
  Administered 2018-07-24 – 2018-07-30 (×7): 50 mg via ORAL
  Filled 2018-07-23 (×8): qty 1

## 2018-07-23 MED ORDER — DIVALPROEX SODIUM 125 MG PO CSDR
250.0000 mg | DELAYED_RELEASE_CAPSULE | Freq: Every day | ORAL | Status: DC
  Administered 2018-07-24 – 2018-07-30 (×7): 250 mg via ORAL
  Filled 2018-07-23 (×9): qty 2

## 2018-07-23 MED ORDER — ACETAMINOPHEN 325 MG PO TABS
650.0000 mg | ORAL_TABLET | Freq: Four times a day (QID) | ORAL | Status: DC | PRN
  Administered 2018-07-25: 650 mg via ORAL
  Filled 2018-07-23: qty 2

## 2018-07-23 MED ORDER — TRAMADOL HCL 50 MG PO TABS: 25.0000 mg | ORAL_TABLET | Freq: Two times a day (BID) | ORAL | Status: DC | PRN

## 2018-07-23 MED ORDER — HYDROCODONE-ACETAMINOPHEN 5-325 MG PO TABS
1.0000 | ORAL_TABLET | ORAL | Status: DC | PRN
  Administered 2018-07-27: 1 via ORAL
  Filled 2018-07-23: qty 1

## 2018-07-23 MED ORDER — DIVALPROEX SODIUM 125 MG PO CSDR
125.0000 mg | DELAYED_RELEASE_CAPSULE | Freq: Every day | ORAL | Status: DC
  Administered 2018-07-24 – 2018-07-31 (×8): 125 mg via ORAL
  Filled 2018-07-23 (×9): qty 1

## 2018-07-23 MED ORDER — IPRATROPIUM-ALBUTEROL 0.5-2.5 (3) MG/3ML IN SOLN: 3.0000 mL | Freq: Three times a day (TID) | RESPIRATORY_TRACT | Status: DC

## 2018-07-23 MED ORDER — GABAPENTIN 400 MG PO CAPS
400.0000 mg | ORAL_CAPSULE | Freq: Every day | ORAL | Status: DC
  Administered 2018-07-24 – 2018-07-30 (×7): 400 mg via ORAL
  Filled 2018-07-23 (×8): qty 1

## 2018-07-23 MED ORDER — ONDANSETRON HCL 4 MG PO TABS: 4.0000 mg | ORAL_TABLET | Freq: Four times a day (QID) | ORAL | Status: DC | PRN

## 2018-07-23 MED ORDER — SODIUM CHLORIDE 0.9 % IV SOLN
INTRAVENOUS | Status: AC
  Administered 2018-07-23: 08:00:00 via INTRAVENOUS

## 2018-07-23 MED ORDER — IPRATROPIUM-ALBUTEROL 0.5-2.5 (3) MG/3ML IN SOLN
3.0000 mL | Freq: Three times a day (TID) | RESPIRATORY_TRACT | Status: DC
  Administered 2018-07-24 (×3): 3 mL via RESPIRATORY_TRACT
  Filled 2018-07-23 (×4): qty 3

## 2018-07-23 MED ORDER — ALLOPURINOL 300 MG PO TABS
150.0000 mg | ORAL_TABLET | Freq: Every day | ORAL | Status: DC
  Administered 2018-07-23 – 2018-07-25 (×3): 150 mg via ORAL
  Filled 2018-07-23 (×3): qty 1

## 2018-07-23 MED ORDER — TORSEMIDE 20 MG PO TABS
40.0000 mg | ORAL_TABLET | Freq: Every day | ORAL | Status: DC
  Administered 2018-07-23 – 2018-07-24 (×2): 40 mg via ORAL
  Filled 2018-07-23 (×2): qty 2

## 2018-07-23 MED ORDER — IPRATROPIUM-ALBUTEROL 0.5-2.5 (3) MG/3ML IN SOLN: 3.0000 mL | Freq: Four times a day (QID) | RESPIRATORY_TRACT | Status: DC | PRN

## 2018-07-23 MED ORDER — INSULIN ASPART 100 UNIT/ML ~~LOC~~ SOLN
0.0000 [IU] | SUBCUTANEOUS | Status: DC
  Administered 2018-07-23 (×2): 2 [IU] via SUBCUTANEOUS
  Administered 2018-07-24: 1 [IU] via SUBCUTANEOUS
  Administered 2018-07-24 – 2018-07-26 (×7): 2 [IU] via SUBCUTANEOUS
  Administered 2018-07-26: 3 [IU] via SUBCUTANEOUS
  Administered 2018-07-26 – 2018-07-27 (×3): 2 [IU] via SUBCUTANEOUS
  Administered 2018-07-27: 5 [IU] via SUBCUTANEOUS
  Administered 2018-07-27: 3 [IU] via SUBCUTANEOUS
  Administered 2018-07-27: 5 [IU] via SUBCUTANEOUS
  Administered 2018-07-27: 3 [IU] via SUBCUTANEOUS
  Administered 2018-07-27: 1 [IU] via SUBCUTANEOUS
  Administered 2018-07-28: 3 [IU] via SUBCUTANEOUS
  Administered 2018-07-28 (×2): 2 [IU] via SUBCUTANEOUS
  Administered 2018-07-28: 5 [IU] via SUBCUTANEOUS
  Administered 2018-07-28: 1 [IU] via SUBCUTANEOUS
  Administered 2018-07-29: 2 [IU] via SUBCUTANEOUS
  Administered 2018-07-29: 3 [IU] via SUBCUTANEOUS
  Administered 2018-07-29 (×2): 2 [IU] via SUBCUTANEOUS
  Administered 2018-07-29: 3 [IU] via SUBCUTANEOUS
  Administered 2018-07-30 (×3): 2 [IU] via SUBCUTANEOUS
  Administered 2018-07-30: 3 [IU] via SUBCUTANEOUS
  Administered 2018-07-30: 5 [IU] via SUBCUTANEOUS
  Administered 2018-07-30 – 2018-07-31 (×3): 2 [IU] via SUBCUTANEOUS

## 2018-07-23 MED ORDER — PANTOPRAZOLE SODIUM 40 MG IV SOLR
40.0000 mg | Freq: Two times a day (BID) | INTRAVENOUS | Status: DC
  Administered 2018-07-23 – 2018-07-31 (×16): 40 mg via INTRAVENOUS
  Filled 2018-07-23 (×16): qty 40

## 2018-07-23 MED ORDER — SODIUM CHLORIDE 0.9 % IV SOLN: INTRAVENOUS | Status: DC

## 2018-07-23 NOTE — Consult Note (Signed)
Reason for Consult: GI bleeding presumed lower Referring Physician: Hospital team  [name] is an 85 y.o. female.  HPI: Patient seen and examined in our office computer chart and her hospital computer chart was reviewed and she did have a colonoscopy in 2011 with a few small polyps and no other work-up that we could find in her case was discussed with her daughter as well and she is not very alert right now and she has been like that for some time and has been in a nursing home since her stroke and no GI problems run in the family and she is not on any blood thinners and she has no other complaints  Past Medical History:  Diagnosis Date  . Cancer ([place]) 07/04/10   endometrial  . Diabetes mellitus   . Falling episodes   . Gout   . Hx of radiation therapy 09/13/10- 10/19/10, 6/12, 6/19, 11/14/10   pelvis, external beam tehn brachytherapy  . Hypertension   . Pneumonia   . Renal disorder   . Stroke [facility])    L sided deficits w/slurred speech    Past Surgical History:  Procedure Laterality Date  . ABDOMINAL HYSTERECTOMY      Family History  Problem Relation Age of Onset  . Hypertension Mother   . Hypertension Father     Social History:  reports that she has quit smoking. Her smoking use included cigarettes. She has never used smokeless tobacco. She reports that she does not drink alcohol or use drugs.  Allergies:  Allergies  Allergen Reactions  . Penicillins Other (See Comments)    Is ALLERGIC, per MAR    Medications: I have reviewed the patient's current medications.  Results for orders placed or performed during the hospital encounter of 07/23/18 (from the past 48 hour(s))  Type and screen [place]     Status: None (Preliminary result)   Collection Time: 07/23/18 12:47 AM  Result Value Ref Range   ABO/RH(D) A NEG    Antibody Screen NEG    Sample Expiration 07/26/2018    Unit Number [id number]    Blood Component Type RBC LR PHER1    Unit division  00    Status of Unit ALLOCATED    Transfusion Status OK TO TRANSFUSE    Crossmatch Result Compatible    Unit Number [id number]    Blood Component Type RBC LR PHER2    Unit division 00    Status of Unit ISSUED    Transfusion Status OK TO TRANSFUSE    Crossmatch Result      Compatible Performed at [facility], [address], [place], [place] [address]   CBC with Differential/Platelet     Status: Abnormal   Collection Time: 07/23/18  1:05 AM  Result Value Ref Range   WBC 10.1 4.0 - 10.5 K/uL   RBC 2.43 (L) 3.87 - 5.11 MIL/uL   Hemoglobin 7.9 (L) 12.0 - 15.0 g/dL   HCT 25.7 (L) 36.0 - 46.0 %   MCV 105.8 (H) 80.0 - 100.0 fL   MCH 32.5 26.0 - 34.0 pg   MCHC 30.7 30.0 - 36.0 g/dL   RDW 15.1 11.5 - 15.5 %   Platelets 288 150 - 400 K/uL   nRBC 0.0 0.0 - 0.2 %   Neutrophils Relative % 61 %   Neutro Abs 6.1 1.7 - 7.7 K/uL   Lymphocytes Relative 28 %   Lymphs Abs 2.8 0.7 - 4.0 K/uL   Monocytes  Relative 6 %   Monocytes Absolute 0.7 0.1 - 1.0 K/uL   Eosinophils Relative 4 %   Eosinophils Absolute 0.4 0.0 - 0.5 K/uL   Basophils Relative 0 %   Basophils Absolute 0.0 0.0 - 0.1 K/uL   Immature Granulocytes 1 %   Abs Immature Granulocytes 0.07 0.00 - 0.07 K/uL    Comment: Performed at [facility], [place] [address], [place], [place] [address]  Basic metabolic panel     Status: Abnormal   Collection Time: 07/23/18  1:05 AM  Result Value Ref Range   Sodium 133 (L) 135 - 145 mmol/L   Potassium 4.6 3.5 - 5.1 mmol/L   Chloride 95 (L) 98 - 111 mmol/L   CO2 24 22 - 32 mmol/L   Glucose, Bld 265 (H) 70 - 99 mg/dL   BUN 52 (H) 8 - 23 mg/dL   Creatinine, Ser 2.89 (H) 0.44 - 1.00 mg/dL   Calcium 10.0 8.9 - 10.3 mg/dL   GFR calc non Af Amer 14 (L) >60 mL/min   GFR calc Af Amer 16 (L) >60 mL/min   Anion gap 14 5 - 15    Comment: Performed at [facility] [address] [place], [place] [address]  Protime-INR     Status: None   Collection Time: 07/23/18  1:05 AM  Result  Value Ref Range   Prothrombin Time 12.9 11.4 - 15.2 seconds   INR 1.0 0.8 - 1.2    Comment: (NOTE) INR goal varies based on device and disease states. Performed at [facility], [place] [address] [place], [place] [address]   CBG monitoring, ED     Status: Abnormal   Collection Time: 07/23/18  5:36 AM  Result Value Ref Range   Glucose-Capillary 196 (H) 70 - 99 mg/dL  CBG monitoring, ED     Status: Abnormal   Collection Time: 07/23/18  7:41 AM  Result Value Ref Range   Glucose-Capillary 199 (H) 70 - 99 mg/dL  Hemoglobin and hematocrit, blood     Status: Abnormal   Collection Time: 07/23/18  8:40 AM  Result Value Ref Range   Hemoglobin 6.8 (LL) 12.0 - 15.0 g/dL    Comment: REPEATED TO VERIFY THIS CRITICAL RESULT HAS VERIFIED AND BEEN CALLED TO SCOTT MCCORD,RN BY ZELDA BEECH ON 03 11 2020 AT 0921, AND HAS BEEN READ BACK.     HCT 21.4 (L) 36.0 - 46.0 %    Comment: Performed at [facility], [place] [address] [place], [place] [address]  Prepare RBC     Status: None   Collection Time: 07/23/18 12:41 PM  Result Value Ref Range   Order Confirmation      ORDER PROCESSED BY BLOOD BANK Performed at [facility], [place] [address], [place], [place] [address]   Glucose, capillary     Status: Abnormal   Collection Time: 07/23/18  1:00 PM  Result Value Ref Range   Glucose-Capillary 181 (H) 70 - 99 mg/dL  Glucose, capillary     Status: Abnormal   Collection Time: 07/23/18  4:19 PM  Result Value Ref Range   Glucose-Capillary 138 (H) 70 - 99 mg/dL    Ct Abdomen Pelvis Wo Contrast  Result Date: 07/23/2018 CLINICAL DATA:  Vaginal bleeding EXAM: CT ABDOMEN AND PELVIS WITHOUT CONTRAST TECHNIQUE: Multidetector CT imaging of the abdomen and pelvis was performed following the standard protocol without IV contrast. COMPARISON:  07/19/2017 FINDINGS: Lower chest:  Extensive atherosclerotic  calcification Hepatobiliary: Hepatic steatosis.Cholelithiasis without evidence of inflammation or  biliary obstruction. Pancreas: Unremarkable. Spleen: Unremarkable. Adrenals/Urinary Tract: Negative adrenals. No hydronephrosis or ureteral stone. Right calcifications are at least predominantly vascular. Unremarkable bladder. Stomach/Bowel: Diffuse formed stool. No bowel obstruction. No evidence of bowel inflammation. Vascular/Lymphatic: Extensive atherosclerotic calcification. Focal calcification of a peripheral ileocolic vessels is chronic. No mass or adenopathy. There is retroperitoneal stranding at the level of the pelvis that is stable from prior. Suspect this is related patient's history of radiotherapy. Reproductive:Hysterectomy.  No visualized ovaries. Other: No ascites or pneumoperitoneum. Musculoskeletal: No acute abnormalities. Disc narrowing and facet arthropathy with L4-5 anterolisthesis. IMPRESSION: 1. No acute finding or change from 07/19/2017. 2. Hysterectomy. 3. Hepatic steatosis and cholelithiasis. Electronically Signed   By: Monte Fantasia M.D.   On: 07/23/2018 04:23   US Pelvic Complete With Transvaginal  Result Date: 07/23/2018 CLINICAL DATA:  Postmenopausal vaginal bleeding. Previous hysterectomy. EXAM: TRANSABDOMINAL AND TRANSVAGINAL ULTRASOUND OF PELVIS TECHNIQUE: Both transabdominal and transvaginal ultrasound examinations of the pelvis were performed. Transabdominal technique was performed for global imaging of the pelvis including uterus, ovaries, adnexal regions, and pelvic cul-de-sac. It was necessary to proceed with endovaginal exam following the transabdominal exam to visualize the ovaries. COMPARISON:  None FINDINGS: Uterus Uterus is surgically absent. Endometrium Surgically absent. Right ovary Not visualized. Left ovary Not visualized. Other findings No abnormal free fluid. Filled bladder is present without filling defect or wall thickening. IMPRESSION: Uterus is surgically absent. Ovaries are not visualized but no mass or abnormal fluid demonstrated in the visualized adnexal  regions. Electronically Signed   By: Lucienne Capers M.D.   On: 07/23/2018 03:31    ROS negative except above there was a question last week of blood in her urine and supposedly a urinalysis was done Blood pressure (!) 190/66, pulse 64, temperature 98.1 F (36.7 C), temperature source Oral, resp. rate 18, height 5' (1.524 m), weight 74.8 kg, SpO2 94 %. Physical Exam vital signs stable afebrile patient very lethargic opens her eyes occasionally and rarely answers a question exam pertinent for abdomen being soft nontender her hemoglobin is only dropped minimally from her baseline with MCV actually increased and BUN and creatinine slight increase and her CT did not show anything significant  Assessment/Plan: Hopefully self-limited GI bleeding in a patient with multiple medical problems Plan: The daughter and I agree that we do not want to do a colonoscopy unless we absolutely have to and reassurance was given about her CT and prepping her will be incredibly difficult and probably require a feeding tube and putting the prep through that and we will check on tomorrow and please call us if signs of increased bleeding and if so consider nuclear bleeding scan stat at that point  [facility] E 07/23/2018, 5:39 PM

## 2018-07-23 NOTE — Progress Notes (Signed)
Pt had formed bowel movement.  Stools was dark and red, blood in the urine.

## 2018-07-23 NOTE — ED Notes (Signed)
Betty Fries, NP called to check in on pt.

## 2018-07-23 NOTE — ED Notes (Signed)
ED TO INPATIENT HANDOFF REPORT  ED Nurse Name and Phone #: Nicki Reaper [place] Name/Age/Gender [name] 85 y.o. female Room/Bed: H021C/H021C  Code Status   Code Status: DNR  Home/SNF/Other Nursing Home Patient oriented to: self, place, time and situation Is this baseline? Yes   Triage Complete: Triage complete  Chief Complaint Gi bleed   Triage Note Pt complaing of bleeding from GI tract since yesterday afternon   Allergies Allergies  Allergen Reactions  . Penicillins Other (See Comments)    unknown    Level of Care/Admitting Diagnosis ED Disposition    ED Disposition Condition Comment   Admit  Hospital Area: [facility] [[id number]]  Level of Care: Med-Surg [16]  I expect the patient will be discharged within 24 hours: Yes  LOW acuity---Tx typically complete <24 hrs---ACUTE conditions typically can be evaluated <24 hours---LABS likely to return to acceptable levels <24 hours---IS near functional baseline---EXPECTED to return to current living arrangement---NOT newly hypoxic: Does not meet criteria for 5C-Observation unit  Diagnosis: Acute GI bleeding [924462]  Admitting Physician: Vianne Bulls [[id number]]  Attending Physician: Vianne Bulls [[id number]]  PT Class (Do Not Modify): Observation [104]  PT Acc Code (Do Not Modify): Observation [10022]       B Medical/Surgery History Past Medical History:  Diagnosis Date  . Cancer ([place]) 07/04/10   endometrial  . Diabetes mellitus   . Falling episodes   . Gout   . Hx of radiation therapy 09/13/10- 10/19/10, 6/12, 6/19, 11/14/10   pelvis, external beam tehn brachytherapy  . Hypertension   . Pneumonia   . Renal disorder   . Stroke [facility])    L sided deficits w/slurred speech   Past Surgical History:  Procedure Laterality Date  . ABDOMINAL HYSTERECTOMY       A IV Location/Drains/Wounds Patient Lines/Drains/Airways Status   Active Line/Drains/Airways    Name:   Placement date:   Placement  time:   Site:   Days:   Peripheral IV 07/23/18 Right Antecubital   07/23/18    0049    Antecubital   less than 1   External Urinary Catheter   07/20/17    0700    -   368          Intake/Output Last 24 hours No intake or output data in the 24 hours ending 07/23/18 1023  Labs/Imaging Results for orders placed or performed during the hospital encounter of 07/23/18 (from the past 48 hour(s))  Type and screen [place]     Status: None   Collection Time: 07/23/18 12:47 AM  Result Value Ref Range   ABO/RH(D) A NEG    Antibody Screen NEG    Sample Expiration      07/26/2018 Performed at [facility], [place] [address], [place], [place] [address]   CBC with Differential/Platelet     Status: Abnormal   Collection Time: 07/23/18  1:05 AM  Result Value Ref Range   WBC 10.1 4.0 - 10.5 K/uL   RBC 2.43 (L) 3.87 - 5.11 MIL/uL   Hemoglobin 7.9 (L) 12.0 - 15.0 g/dL   HCT 25.7 (L) 36.0 - 46.0 %   MCV 105.8 (H) 80.0 - 100.0 fL   MCH 32.5 26.0 - 34.0 pg   MCHC 30.7 30.0 - 36.0 g/dL   RDW 15.1 11.5 - 15.5 %   Platelets 288 150 - 400 K/uL   nRBC 0.0 0.0 - 0.2 %   Neutrophils Relative %  61 %   Neutro Abs 6.1 1.7 - 7.7 K/uL   Lymphocytes Relative 28 %   Lymphs Abs 2.8 0.7 - 4.0 K/uL   Monocytes Relative 6 %   Monocytes Absolute 0.7 0.1 - 1.0 K/uL   Eosinophils Relative 4 %   Eosinophils Absolute 0.4 0.0 - 0.5 K/uL   Basophils Relative 0 %   Basophils Absolute 0.0 0.0 - 0.1 K/uL   Immature Granulocytes 1 %   Abs Immature Granulocytes 0.07 0.00 - 0.07 K/uL    Comment: Performed at [facility] [address], [place], [place] [address]  Basic metabolic panel     Status: Abnormal   Collection Time: 07/23/18  1:05 AM  Result Value Ref Range   Sodium 133 (L) 135 - 145 mmol/L   Potassium 4.6 3.5 - 5.1 mmol/L   Chloride 95 (L) 98 - 111 mmol/L   CO2 24 22 - 32 mmol/L   Glucose, Bld 265 (H) 70 - 99 mg/dL   BUN 52 (H) 8 - 23 mg/dL   Creatinine, Ser 2.89 (H) 0.44 -  1.00 mg/dL   Calcium 10.0 8.9 - 10.3 mg/dL   GFR calc non Af Amer 14 (L) >60 mL/min   GFR calc Af Amer 16 (L) >60 mL/min   Anion gap 14 5 - 15    Comment: Performed at [facility] [address], [place], [place] [address]  Protime-INR     Status: None   Collection Time: 07/23/18  1:05 AM  Result Value Ref Range   Prothrombin Time 12.9 11.4 - 15.2 seconds   INR 1.0 0.8 - 1.2    Comment: (NOTE) INR goal varies based on device and disease states. Performed at [facility], [place] [address] [place], [place] [address]   CBG monitoring, ED     Status: Abnormal   Collection Time: 07/23/18  5:36 AM  Result Value Ref Range   Glucose-Capillary 196 (H) 70 - 99 mg/dL  CBG monitoring, ED     Status: Abnormal   Collection Time: 07/23/18  7:41 AM  Result Value Ref Range   Glucose-Capillary 199 (H) 70 - 99 mg/dL  Hemoglobin and hematocrit, blood     Status: Abnormal   Collection Time: 07/23/18  8:40 AM  Result Value Ref Range   Hemoglobin 6.8 (LL) 12.0 - 15.0 g/dL    Comment: REPEATED TO VERIFY THIS CRITICAL RESULT HAS VERIFIED AND BEEN CALLED TO Bennette Hasty,RN BY ZELDA BEECH ON 03 11 2020 AT 0921, AND HAS BEEN READ BACK.     HCT 21.4 (L) 36.0 - 46.0 %    Comment: Performed at [facility], [place] [address], [place], [place] [address]   Ct Abdomen Pelvis Wo Contrast  Result Date: 07/23/2018 CLINICAL DATA:  Vaginal bleeding EXAM: CT ABDOMEN AND PELVIS WITHOUT CONTRAST TECHNIQUE: Multidetector CT imaging of the abdomen and pelvis was performed following the standard protocol without IV contrast. COMPARISON:  07/19/2017 FINDINGS: Lower chest:  Extensive atherosclerotic calcification Hepatobiliary: Hepatic steatosis.Cholelithiasis without evidence of inflammation or biliary obstruction. Pancreas: Unremarkable. Spleen: Unremarkable. Adrenals/Urinary Tract: Negative adrenals. No hydronephrosis or ureteral stone. Right calcifications are at least predominantly vascular. Unremarkable  bladder. Stomach/Bowel: Diffuse formed stool. No bowel obstruction. No evidence of bowel inflammation. Vascular/Lymphatic: Extensive atherosclerotic calcification. Focal calcification of a peripheral ileocolic vessels is chronic. No mass or adenopathy. There is retroperitoneal stranding at the level of the pelvis that is stable from prior. Suspect this is related patient's  history of radiotherapy. Reproductive:Hysterectomy.  No visualized ovaries. Other: No ascites or pneumoperitoneum. Musculoskeletal: No acute abnormalities. Disc narrowing and facet arthropathy with L4-5 anterolisthesis. IMPRESSION: 1. No acute finding or change from 07/19/2017. 2. Hysterectomy. 3. Hepatic steatosis and cholelithiasis. Electronically Signed   By: Monte Fantasia M.D.   On: 07/23/2018 04:23   US Pelvic Complete With Transvaginal  Result Date: 07/23/2018 CLINICAL DATA:  Postmenopausal vaginal bleeding. Previous hysterectomy. EXAM: TRANSABDOMINAL AND TRANSVAGINAL ULTRASOUND OF PELVIS TECHNIQUE: Both transabdominal and transvaginal ultrasound examinations of the pelvis were performed. Transabdominal technique was performed for global imaging of the pelvis including uterus, ovaries, adnexal regions, and pelvic cul-de-sac. It was necessary to proceed with endovaginal exam following the transabdominal exam to visualize the ovaries. COMPARISON:  None FINDINGS: Uterus Uterus is surgically absent. Endometrium Surgically absent. Right ovary Not visualized. Left ovary Not visualized. Other findings No abnormal free fluid. Filled bladder is present without filling defect or wall thickening. IMPRESSION: Uterus is surgically absent. Ovaries are not visualized but no mass or abnormal fluid demonstrated in the visualized adnexal regions. Electronically Signed   By: Lucienne Capers M.D.   On: 07/23/2018 03:31    Pending Labs Unresulted Labs (From admission, onward)    Start     Ordered   07/23/18 0830  Hemoglobin and hematocrit, blood   Now then every 8 hours,   R     07/23/18 0831   Unscheduled  Occult blood card to lab, stool RN will collect  As needed,   R    Question:  Specimen to be collected by?  Answer:  RN will collect   07/23/18 0513          Vitals/Pain Today's Vitals   07/23/18 0733 07/23/18 0735 07/23/18 0800 07/23/18 0840  BP: (!) 138/47  (!) 156/51   Pulse: 66  70   Resp: 18  18   Temp:      TempSrc:      SpO2: 93%  94%   Weight:      Height:      PainSc:  0-No pain  0-No pain    Isolation Precautions No active isolations  Medications Medications  ipratropium-albuterol (DUONEB) 0.5-2.5 (3) MG/3ML nebulizer solution 3 mL (has no administration in time range)  insulin aspart (novoLOG) injection 0-9 Units (0 Units Subcutaneous Hold 07/23/18 0806)  0.9 %  sodium chloride infusion ( Intravenous New Bag/Given 07/23/18 0750)  acetaminophen (TYLENOL) tablet 650 mg (has no administration in time range)    Or  acetaminophen (TYLENOL) suppository 650 mg (has no administration in time range)  HYDROcodone-acetaminophen (NORCO/VICODIN) 5-325 MG per tablet 1 tablet (has no administration in time range)  ondansetron (ZOFRAN) tablet 4 mg (has no administration in time range)    Or  ondansetron (ZOFRAN) injection 4 mg (has no administration in time range)  pantoprazole (PROTONIX) injection 40 mg (40 mg Intravenous Given 07/23/18 0750)    Mobility walks with person assist High fall risk   Focused Assessments med/surg   R Recommendations: See Admitting Provider Note  Report given to:   Additional Notes:

## 2018-07-23 NOTE — ED Notes (Signed)
Patient transported to Ultrasound 

## 2018-07-23 NOTE — Progress Notes (Signed)
Patient seen and examined this morning. I reviewed this morning history and physical, and agree with assessment plan. Discussed with patient and nursing staff-complains of soreness on the back in the anal area.  Per nursing "patient had a bleeding from anal fissure last night" No recurrent bleeding noted.  H&H pending and patient is agreeable for blood transfusion if she has further drop in her hemoglobin.  I explained risks benefits alternatives. Please refer to admission note for details.  85 year old female with history of uterine cancer status post hysterectomy and radiation, chronic CHF, diastolic, IDDM, CKD stage IV, depression/anxiety, chronic microcytic anemia scented to the ER for evaluation of bleeding that he started 07/22/2018 afternoon. She reported history of frequent dark red and maroon blood in her bowel movements which she had been told was due to hemorrhoids, and reports that she had acute increase in bleeding yesterday afternoon, describing dark and maroon blood "pouring out."  She thought that the blood was coming from her rectum, but was noted to have blood and clots at the introitus in the ED, was asked whether the bleeding she has been having was coming from the vagina or rectum, and she was not sure  In the ER hemoglobin 7.9 g creatinine 2.89 g.  Previous hemoglobin 8.2 g in July. CT abdomen pelvis no acute internal pathology, pelvic ultrasound showed surgically absent uterus. Patient was admitted for observation with a fecal occult blood, serial H&H and close observation. Repeat hemoglobin at 6.8.  As per discussion the patient regarding blood transfusions she was in agreement with blood transfusion if it is required. Ordered 2 units of PRBC, monitor serial H&H, continue PPI.[facility] gastroenterology.  Repeat H&H after first unit if hemoglobin well above 7 g could hold off on secondary transfusion, spoke w RN. Also resume her torsemide.  Chronic microcytic anemia  hemoglobin on lower range in 8p previously.  CKD stage IV, creatinine 2.89 on admission, similar to prior.  Monitor. History of chronic diastolic CHF, resume torsemide. IDDM with A1c 6.5% 1 year ago: home insulin regimen levemir 45 units in the morning and 55 in the evening, asked RN to verify her meds.  Sugar is controlled currently on sliding scale Anxiety/depression stable. resume Depakote trazodone

## 2018-07-23 NOTE — ED Provider Notes (Signed)
[facility] Provider Note   CSN: [id number] Arrival date & time: 07/23/18  0040    History   Chief Complaint Chief Complaint  Patient presents with  . Rectal Bleeding    HPI [name] is a 85 y.o. female.     The history is provided by the EMS personnel and medical records.  Rectal Bleeding  Quality:  Bright red Amount:  Copious Timing:  Constant Chronicity:  New Context: spontaneously   Context: not anal fissures, not defecation and not rectal pain   Relieved by:  Nothing Worsened by:  Nothing Ineffective treatments:  None tried Associated symptoms: no abdominal pain, no epistaxis, no fever and no loss of consciousness   Risk factors: no hx of IBD   Patient with known renal disease and diabetes presents with rectal bleeding vs. Vaginal bleeding.    Past Medical History:  Diagnosis Date  . Cancer ([place]) 07/04/10   endometrial  . Diabetes mellitus   . Falling episodes   . Gout   . Hx of radiation therapy 09/13/10- 10/19/10, 6/12, 6/19, 11/14/10   pelvis, external beam tehn brachytherapy  . Hypertension   . Pneumonia   . Renal disorder   . Stroke [facility])    L sided deficits w/slurred speech    Patient Active Problem List   Diagnosis Date Noted  . Acute GI bleeding 07/23/2018  . Acute on chronic respiratory failure with hypoxia ([place]) 11/14/2017  . CKD (chronic kidney disease), stage IV ([place]) 07/23/2017  . Edema 07/20/2017  . Shortness of breath 07/20/2017  . Gastroesophageal reflux disease   . Hyperlipidemia   . AKI (acute kidney injury) ([place]) 07/19/2017  . Acute respiratory failure ([place]) 07/30/2013  . GI bleed 07/29/2013  . Anemia associated with acute blood loss 07/29/2013  . Diarrhea 07/21/2013  . Renal failure, acute on chronic ([facility]) 07/20/2013  . Dehydration with hyponatremia 07/20/2013  . Anemia 07/20/2013  . Acute encephalopathy 07/20/2013  . Diabetes mellitus type 2, controlled ([place]) 07/20/2013  . Stroke  ([place])   . Hypertension   . Hx of radiation therapy   . Cancer ([place])   . Malignant neoplasm of corpus uteri, except isthmus ([place]) 06/11/2011    Past Surgical History:  Procedure Laterality Date  . ABDOMINAL HYSTERECTOMY       OB History    Gravida  2   Para  2   Term      Preterm      AB      Living        SAB      TAB      Ectopic      Multiple      Live Births               Home Medications    Prior to Admission medications   Medication Sig Start Date End Date Taking? Authorizing Provider  PRESCRIPTION MEDICATION Apply 1 application topically every Monday. selsum blue shampoo for itchy/flaky scalp   Yes [provider]  acetaminophen (TYLENOL) 325 MG tablet Take 650 mg by mouth every 8 (eight) hours as needed for moderate pain.    [provider]  acetaminophen (TYLENOL) 325 MG tablet Take by mouth 3 (three) times daily.    [provider]  allopurinol (ZYLOPRIM) 300 MG tablet Take 150 mg by mouth at bedtime.    [provider]  bisoprolol (ZEBETA) 10 MG tablet Take 1 tablet (10 mg total) by mouth  daily. 07/23/13   Josetta Huddle, MD  Cholecalciferol (HM VITAMIN D3) 4000 UNITS CAPS Take 8,000 Units by mouth daily.    [provider]  Cyanocobalamin (VITAMIN B 12 PO) Take 1 tablet by mouth daily.     [provider]  Dextromethorphan-guaiFENesin 10-100 MG/5ML liquid Take 10 mLs by mouth every 4 (four) hours as needed (cough).    [provider]  divalproex (DEPAKOTE SPRINKLE) 125 MG capsule Take 375 mg by mouth at bedtime.    [provider]  divalproex (DEPAKOTE SPRINKLE) 125 MG capsule 250 mg every morning and 125 mg in the afternoon 11/15/17   Roxan Hockey, MD  ferrous sulfate 325 (65 FE) MG tablet Take 325 mg by mouth daily with breakfast.    [provider]  FLUoxetine (PROZAC) 20 MG tablet Take 30 mg by mouth daily.    [provider]  fluticasone (FLONASE) 50  MCG/ACT nasal spray Place 1 spray into both nostrils daily.    [provider]  gabapentin (NEURONTIN) 400 MG capsule Take 400 mg by mouth at bedtime.    [provider]  hydrALAZINE (APRESOLINE) 25 MG tablet Take 1 tablet (25 mg total) by mouth 3 (three) times daily. 11/15/17   Roxan Hockey, MD  insulin aspart (NOVOLOG) 100 UNIT/ML injection Inject 0-15 Units into the skin 3 (three) times daily with meals. 11/15/17   Emokpae, Courage, MD  ipratropium-albuterol (DUONEB) 0.5-2.5 (3) MG/3ML SOLN Take 3 mLs by nebulization 3 (three) times daily. 11/15/17   Emokpae, Courage, MD  LEVEMIR FLEXTOUCH 100 UNIT/ML Pen Inject 45 Units into the skin daily. 10/18/17   [provider]  linagliptin (TRADJENTA) 5 MG TABS tablet Take 5 mg by mouth daily.    [provider]  magnesium oxide (MAG-OX) 400 MG tablet Take 400 mg by mouth daily.    [provider]  Menthol, Topical Analgesic, (BIOFREEZE ROLL-ON) 4 % GEL Apply 1 application topically 2 (two) times daily.    [provider]  Olopatadine HCl 0.2 % SOLN Place 1 drop into both eyes daily. 07/08/17   [provider]  OXYGEN Inhale 2 L into the lungs at bedtime.    [provider]  pantoprazole (PROTONIX) 20 MG tablet Take 20 mg by mouth daily. 07/04/17   [provider]  Pramox-PE-Glycerin-Petrolatum (HEMORRHOIDAL MAX STR RE) Place 1 application rectally 2 (two) times daily as needed (for rectal pain).    [provider]  predniSONE (DELTASONE) 20 MG tablet Take 1 tablet (20 mg total) by mouth daily with breakfast. 11/15/17   Denton Brick, Courage, MD  rOPINIRole (REQUIP) 1 MG tablet Take 1 mg by mouth 2 (two) times daily.    [provider]  senna-docusate (SENOKOT-S) 8.6-50 MG tablet Take 2 tablets by mouth at bedtime. 11/15/17 11/15/18  Roxan Hockey, MD  simethicone (MYLICON) 80 MG chewable tablet Chew 80 mg by mouth 3 (three) times daily as needed for flatulence (and  bloating).    [provider]  sodium chloride (DEEP SEA NASAL SPRAY) 0.65 % nasal spray Place 1 spray into the nose 4 (four) times daily as needed for congestion.     [provider]  tetrahydrozoline 0.05 % ophthalmic solution Place 2 drops into both eyes 2 (two) times daily.    [provider]  torsemide (DEMADEX) 20 MG tablet Take 2 tablets (40 mg total) by mouth daily. 07/23/17   Eugenie Filler, MD  traMADol (ULTRAM) 50 MG tablet Take 0.5 tablets (25 mg  total) by mouth every 12 (twelve) hours as needed for moderate pain. 11/15/17   Roxan Hockey, MD  traZODone (DESYREL) 50 MG tablet Take 50 mg by mouth at bedtime. 06/25/17   [provider]    Family History Family History  Problem Relation Age of Onset  . Hypertension Mother   . Hypertension Father     Social History Social History   Tobacco Use  . Smoking status: Former Smoker    Types: Cigarettes  . Smokeless tobacco: Never Used  . Tobacco comment: quit many yrs ago  Substance Use Topics  . Alcohol use: No  . Drug use: No     Allergies   Penicillins   Review of Systems Review of Systems  Constitutional: Negative for fever.  HENT: Negative for nosebleeds.   Respiratory: Negative for shortness of breath.   Cardiovascular: Negative for chest pain.  Gastrointestinal: Positive for anal bleeding and hematochezia. Negative for abdominal pain and rectal pain.  Genitourinary: Negative for dysuria.  Neurological: Negative for loss of consciousness.  All other systems reviewed and are negative.    Physical Exam Updated Vital Signs BP (!) 149/55   Pulse 69   Temp 98.6 F (37 C) (Oral)   Resp 18   Ht 5' (1.524 m)   Wt 74.8 kg   SpO2 97%   BMI 32.22 kg/m   Physical Exam Vitals signs and nursing note reviewed.  Constitutional:      General: She is not in acute distress.    Appearance: She is obese.  HENT:     Head: Normocephalic and atraumatic.     Nose: Nose normal.      Mouth/Throat:     Mouth: Mucous membranes are moist.     Pharynx: Oropharynx is clear.  Eyes:     Conjunctiva/sclera: Conjunctivae normal.     Pupils: Pupils are equal, round, and reactive to light.  Neck:     Musculoskeletal: Normal range of motion and neck supple.  Cardiovascular:     Rate and Rhythm: Normal rate and regular rhythm.     Pulses: Normal pulses.     Heart sounds: Normal heart sounds.  Pulmonary:     Effort: Pulmonary effort is normal.     Breath sounds: Normal breath sounds.  Genitourinary:    General: Normal vulva.     Rectum: Guaiac result positive.     Comments: Blood and clots in the introitus Neurological:     Mental Status: She is alert.      ED Treatments / Results  Labs (all labs ordered are listed, but only abnormal results are displayed) Results for orders placed or performed during the hospital encounter of 07/23/18  CBC with Differential/Platelet  Result Value Ref Range   WBC 10.1 4.0 - 10.5 K/uL   RBC 2.43 (L) 3.87 - 5.11 MIL/uL   Hemoglobin 7.9 (L) 12.0 - 15.0 g/dL   HCT 25.7 (L) 36.0 - 46.0 %   MCV 105.8 (H) 80.0 - 100.0 fL   MCH 32.5 26.0 - 34.0 pg   MCHC 30.7 30.0 - 36.0 g/dL   RDW 15.1 11.5 - 15.5 %   Platelets 288 150 - 400 K/uL   nRBC 0.0 0.0 - 0.2 %   Neutrophils Relative % 61 %   Neutro Abs 6.1 1.7 - 7.7 K/uL   Lymphocytes Relative 28 %   Lymphs Abs 2.8 0.7 - 4.0 K/uL   Monocytes Relative 6 %   Monocytes Absolute 0.7 0.1 - 1.0 K/uL  Eosinophils Relative 4 %   Eosinophils Absolute 0.4 0.0 - 0.5 K/uL   Basophils Relative 0 %   Basophils Absolute 0.0 0.0 - 0.1 K/uL   Immature Granulocytes 1 %   Abs Immature Granulocytes 0.07 0.00 - 0.07 K/uL  Basic metabolic panel  Result Value Ref Range   Sodium 133 (L) 135 - 145 mmol/L   Potassium 4.6 3.5 - 5.1 mmol/L   Chloride 95 (L) 98 - 111 mmol/L   CO2 24 22 - 32 mmol/L   Glucose, Bld 265 (H) 70 - 99 mg/dL   BUN 52 (H) 8 - 23 mg/dL   Creatinine, Ser 2.89 (H) 0.44 - 1.00 mg/dL    Calcium 10.0 8.9 - 10.3 mg/dL   GFR calc non Af Amer 14 (L) >60 mL/min   GFR calc Af Amer 16 (L) >60 mL/min   Anion gap 14 5 - 15  Protime-INR  Result Value Ref Range   Prothrombin Time 12.9 11.4 - 15.2 seconds   INR 1.0 0.8 - 1.2   Ct Abdomen Pelvis Wo Contrast  Result Date: 07/23/2018 CLINICAL DATA:  Vaginal bleeding EXAM: CT ABDOMEN AND PELVIS WITHOUT CONTRAST TECHNIQUE: Multidetector CT imaging of the abdomen and pelvis was performed following the standard protocol without IV contrast. COMPARISON:  07/19/2017 FINDINGS: Lower chest:  Extensive atherosclerotic calcification Hepatobiliary: Hepatic steatosis.Cholelithiasis without evidence of inflammation or biliary obstruction. Pancreas: Unremarkable. Spleen: Unremarkable. Adrenals/Urinary Tract: Negative adrenals. No hydronephrosis or ureteral stone. Right calcifications are at least predominantly vascular. Unremarkable bladder. Stomach/Bowel: Diffuse formed stool. No bowel obstruction. No evidence of bowel inflammation. Vascular/Lymphatic: Extensive atherosclerotic calcification. Focal calcification of a peripheral ileocolic vessels is chronic. No mass or adenopathy. There is retroperitoneal stranding at the level of the pelvis that is stable from prior. Suspect this is related patient's history of radiotherapy. Reproductive:Hysterectomy.  No visualized ovaries. Other: No ascites or pneumoperitoneum. Musculoskeletal: No acute abnormalities. Disc narrowing and facet arthropathy with L4-5 anterolisthesis. IMPRESSION: 1. No acute finding or change from 07/19/2017. 2. Hysterectomy. 3. Hepatic steatosis and cholelithiasis. Electronically Signed   By: Monte Fantasia M.D.   On: 07/23/2018 04:23   US Pelvic Complete With Transvaginal  Result Date: 07/23/2018 CLINICAL DATA:  Postmenopausal vaginal bleeding. Previous hysterectomy. EXAM: TRANSABDOMINAL AND TRANSVAGINAL ULTRASOUND OF PELVIS TECHNIQUE: Both transabdominal and transvaginal ultrasound  examinations of the pelvis were performed. Transabdominal technique was performed for global imaging of the pelvis including uterus, ovaries, adnexal regions, and pelvic cul-de-sac. It was necessary to proceed with endovaginal exam following the transabdominal exam to visualize the ovaries. COMPARISON:  None FINDINGS: Uterus Uterus is surgically absent. Endometrium Surgically absent. Right ovary Not visualized. Left ovary Not visualized. Other findings No abnormal free fluid. Filled bladder is present without filling defect or wall thickening. IMPRESSION: Uterus is surgically absent. Ovaries are not visualized but no mass or abnormal fluid demonstrated in the visualized adnexal regions. Electronically Signed   By: Lucienne Capers M.D.   On: 07/23/2018 03:31    EKG None  Radiology Ct Abdomen Pelvis Wo Contrast  Result Date: 07/23/2018 CLINICAL DATA:  Vaginal bleeding EXAM: CT ABDOMEN AND PELVIS WITHOUT CONTRAST TECHNIQUE: Multidetector CT imaging of the abdomen and pelvis was performed following the standard protocol without IV contrast. COMPARISON:  07/19/2017 FINDINGS: Lower chest:  Extensive atherosclerotic calcification Hepatobiliary: Hepatic steatosis.Cholelithiasis without evidence of inflammation or biliary obstruction. Pancreas: Unremarkable. Spleen: Unremarkable. Adrenals/Urinary Tract: Negative adrenals. No hydronephrosis or ureteral stone. Right calcifications are at least predominantly vascular. Unremarkable bladder. Stomach/Bowel: Diffuse formed stool.  No bowel obstruction. No evidence of bowel inflammation. Vascular/Lymphatic: Extensive atherosclerotic calcification. Focal calcification of a peripheral ileocolic vessels is chronic. No mass or adenopathy. There is retroperitoneal stranding at the level of the pelvis that is stable from prior. Suspect this is related patient's history of radiotherapy. Reproductive:Hysterectomy.  No visualized ovaries. Other: No ascites or pneumoperitoneum.  Musculoskeletal: No acute abnormalities. Disc narrowing and facet arthropathy with L4-5 anterolisthesis. IMPRESSION: 1. No acute finding or change from 07/19/2017. 2. Hysterectomy. 3. Hepatic steatosis and cholelithiasis. Electronically Signed   By: Monte Fantasia M.D.   On: 07/23/2018 04:23   US Pelvic Complete With Transvaginal  Result Date: 07/23/2018 CLINICAL DATA:  Postmenopausal vaginal bleeding. Previous hysterectomy. EXAM: TRANSABDOMINAL AND TRANSVAGINAL ULTRASOUND OF PELVIS TECHNIQUE: Both transabdominal and transvaginal ultrasound examinations of the pelvis were performed. Transabdominal technique was performed for global imaging of the pelvis including uterus, ovaries, adnexal regions, and pelvic cul-de-sac. It was necessary to proceed with endovaginal exam following the transabdominal exam to visualize the ovaries. COMPARISON:  None FINDINGS: Uterus Uterus is surgically absent. Endometrium Surgically absent. Right ovary Not visualized. Left ovary Not visualized. Other findings No abnormal free fluid. Filled bladder is present without filling defect or wall thickening. IMPRESSION: Uterus is surgically absent. Ovaries are not visualized but no mass or abnormal fluid demonstrated in the visualized adnexal regions. Electronically Signed   By: Lucienne Capers M.D.   On: 07/23/2018 03:31    Procedures Procedures (including critical care time)  Medications Ordered in ED Medications  0.9 %  sodium chloride infusion (has no administration in time range)     Patient is refusing transfusion.  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   Final Clinical Impressions(s) / ED Diagnoses   Final diagnoses:  Rectal bleeding    Will admit for hydration and observation   Jia Mohamed, MD 07/23/18 [id number]

## 2018-07-23 NOTE — Progress Notes (Signed)
Provider paged. Bloody urine was noticed in suction attached to patient's pure wick. Nurse checked patient's bottom and witnessed large amount of blood with large blood clots visible. Clots appeared to come from rectum, small amount of stool present. Pt in no acute distress. Peri care performed and full linen change.

## 2018-07-23 NOTE — ED Notes (Signed)
Patient transported to CT 

## 2018-07-23 NOTE — ED Notes (Signed)
Ordered a clear liquid diet--[name]

## 2018-07-23 NOTE — H&P (Signed)
History and Physical    [name] YBW:[id number] DOB: December 22, 1928 DOA: 07/23/2018  PCP: Josetta Huddle, MD   Patient coming from: SNF   Chief Complaint: Rectal bleeding   HPI: [name] is a 85 y.o. female with medical history significant for uterine cancer status post hysterectomy and radiation, chronic diastolic CHF, insulin-dependent diabetes mellitus, chronic kidney disease stage IV, depression with anxiety, and chronic microcytic anemia, now presenting to the emergency department for evaluation of bleeding that began yesterday afternoon.  Patient reports a history of frequent dark red and maroon blood in her bowel movements which she had been told was due to hemorrhoids, and reports that she had acute increase in bleeding yesterday afternoon, describing dark and maroon blood "pouring out."  She thought that the blood was coming from her rectum, but was noted to have blood and clots at the introitus in the ED, was asked whether the bleeding she has been having was coming from the vagina or rectum, and she was not sure.  She denies any abdominal pain.  She denies any vomiting.  No recent fevers, chills, chest pain, or cough.  ED Course: Upon arrival to the ED, patient is found to be afebrile, saturating well on room air, and with vitals otherwise normal.  Chemistry panel is notable for sodium of 133, BUN 52, and creatinine 2.89, similar to priors.  CBC features a macrocytic anemia with hemoglobin 7.9, down slightly from 8.2 in July.  CT of the abdomen and pelvis is negative for acute intra-abdominal or pelvic abnormality.  Pelvic ultrasound was performed with uterus confirmed to be surgically absent, and no visualized mass or abnormal fluid in the adnexal regions.  Patient was given IV fluids in the ED.  She was offered a blood transfusion by the ED physician but declined this at this time.  She remains hemodynamically stable and will be observed for ongoing evaluation and  management.  Review of Systems:  All other systems reviewed and apart from HPI, are negative.  Past Medical History:  Diagnosis Date  . Cancer ([place]) 07/04/10   endometrial  . Diabetes mellitus   . Falling episodes   . Gout   . Hx of radiation therapy 09/13/10- 10/19/10, 6/12, 6/19, 11/14/10   pelvis, external beam tehn brachytherapy  . Hypertension   . Pneumonia   . Renal disorder   . Stroke [facility])    L sided deficits w/slurred speech    Past Surgical History:  Procedure Laterality Date  . ABDOMINAL HYSTERECTOMY       reports that she has quit smoking. Her smoking use included cigarettes. She has never used smokeless tobacco. She reports that she does not drink alcohol or use drugs.  Allergies  Allergen Reactions  . Penicillins Other (See Comments)    unknown    Family History  Problem Relation Age of Onset  . Hypertension Mother   . Hypertension Father      Prior to Admission medications   Medication Sig Start Date End Date Taking? Authorizing Provider  PRESCRIPTION MEDICATION Apply 1 application topically every Monday. selsum blue shampoo for itchy/flaky scalp   Yes [provider]  acetaminophen (TYLENOL) 325 MG tablet Take 650 mg by mouth every 8 (eight) hours as needed for moderate pain.    [provider]  acetaminophen (TYLENOL) 325 MG tablet Take by mouth 3 (three) times daily.    [provider]  allopurinol (ZYLOPRIM) 300 MG tablet Take 150 mg by mouth at bedtime.  [provider]  bisoprolol (ZEBETA) 10 MG tablet Take 1 tablet (10 mg total) by mouth daily. 07/23/13   Josetta Huddle, MD  Cholecalciferol (HM VITAMIN D3) 4000 UNITS CAPS Take 8,000 Units by mouth daily.    [provider]  Cyanocobalamin (VITAMIN B 12 PO) Take 1 tablet by mouth daily.     [provider]  Dextromethorphan-guaiFENesin 10-100 MG/5ML liquid Take 10 mLs by mouth every 4 (four) hours as needed (cough).    [provider]   divalproex (DEPAKOTE SPRINKLE) 125 MG capsule Take 375 mg by mouth at bedtime.    [provider]  divalproex (DEPAKOTE SPRINKLE) 125 MG capsule 250 mg every morning and 125 mg in the afternoon 11/15/17   Roxan Hockey, MD  ferrous sulfate 325 (65 FE) MG tablet Take 325 mg by mouth daily with breakfast.    [provider]  FLUoxetine (PROZAC) 20 MG tablet Take 30 mg by mouth daily.    [provider]  fluticasone (FLONASE) 50 MCG/ACT nasal spray Place 1 spray into both nostrils daily.    [provider]  gabapentin (NEURONTIN) 400 MG capsule Take 400 mg by mouth at bedtime.    [provider]  hydrALAZINE (APRESOLINE) 25 MG tablet Take 1 tablet (25 mg total) by mouth 3 (three) times daily. 11/15/17   Roxan Hockey, MD  insulin aspart (NOVOLOG) 100 UNIT/ML injection Inject 0-15 Units into the skin 3 (three) times daily with meals. 11/15/17   Emokpae, Courage, MD  ipratropium-albuterol (DUONEB) 0.5-2.5 (3) MG/3ML SOLN Take 3 mLs by nebulization 3 (three) times daily. 11/15/17   Emokpae, Courage, MD  LEVEMIR FLEXTOUCH 100 UNIT/ML Pen Inject 45 Units into the skin daily. 10/18/17   [provider]  linagliptin (TRADJENTA) 5 MG TABS tablet Take 5 mg by mouth daily.    [provider]  magnesium oxide (MAG-OX) 400 MG tablet Take 400 mg by mouth daily.    [provider]  Menthol, Topical Analgesic, (BIOFREEZE ROLL-ON) 4 % GEL Apply 1 application topically 2 (two) times daily.    [provider]  Olopatadine HCl 0.2 % SOLN Place 1 drop into both eyes daily. 07/08/17   [provider]  OXYGEN Inhale 2 L into the lungs at bedtime.    [provider]  pantoprazole (PROTONIX) 20 MG tablet Take 20 mg by mouth daily. 07/04/17   [provider]  Pramox-PE-Glycerin-Petrolatum (HEMORRHOIDAL MAX STR RE) Place 1 application rectally 2 (two) times daily as needed (for rectal pain).    [provider]   predniSONE (DELTASONE) 20 MG tablet Take 1 tablet (20 mg total) by mouth daily with breakfast. 11/15/17   Denton Brick, Courage, MD  rOPINIRole (REQUIP) 1 MG tablet Take 1 mg by mouth 2 (two) times daily.    [provider]  senna-docusate (SENOKOT-S) 8.6-50 MG tablet Take 2 tablets by mouth at bedtime. 11/15/17 11/15/18  Roxan Hockey, MD  simethicone (MYLICON) 80 MG chewable tablet Chew 80 mg by mouth 3 (three) times daily as needed for flatulence (and bloating).    [provider]  sodium chloride (DEEP SEA NASAL SPRAY) 0.65 % nasal spray Place 1 spray into the nose 4 (four) times daily as needed for congestion.     [provider]  tetrahydrozoline 0.05 % ophthalmic solution Place 2 drops into both eyes 2 (two) times daily.    [provider]  torsemide (DEMADEX) 20 MG tablet Take 2 tablets (40 mg total) by mouth daily. 07/23/17  Eugenie Filler, MD  traMADol (ULTRAM) 50 MG tablet Take 0.5 tablets (25 mg total) by mouth every 12 (twelve) hours as needed for moderate pain. 11/15/17   Roxan Hockey, MD  traZODone (DESYREL) 50 MG tablet Take 50 mg by mouth at bedtime. 06/25/17   [provider]    Physical Exam: Vitals:   07/23/18 0200 07/23/18 0215 07/23/18 0230 07/23/18 0245  BP: (!) 151/55 (!) 155/52 (!) 146/80 (!) 149/55  Pulse: 70 69 71 69  Resp:    18  Temp:      TempSrc:      SpO2: 97% 96% 97% 97%  Weight:      Height:        Constitutional: NAD, calm  Eyes: PERTLA, lids and conjunctivae normal ENMT: Mucous membranes are moist. Posterior pharynx clear of any exudate or lesions.   Neck: normal, supple, no masses, no thyromegaly Respiratory: clear to auscultation bilaterally, no wheezing, no crackles. Normal respiratory effort.    Cardiovascular: S1 & S2 heard, regular rate and rhythm. No extremity edema.   Abdomen: No distension, no tenderness, soft. Bowel sounds active.  Musculoskeletal: no clubbing / cyanosis. No joint deformity upper  and lower extremities.    Skin: no significant rashes, lesions, ulcers. Warm, dry, well-perfused. Neurologic: no gross facial asymmetry. Gross hearing deficit. Sensation intact. Moving all extremities.  Psychiatric: Alert and oriented to person, place, and situation. Calm, cooperative.    Labs on Admission: I have personally reviewed following labs and imaging studies  CBC: Recent Labs  Lab 07/23/18 0105  WBC 10.1  NEUTROABS 6.1  HGB 7.9*  HCT 25.7*  MCV 105.8*  PLT 962   Basic Metabolic Panel: Recent Labs  Lab 07/23/18 0105  NA 133*  K 4.6  CL 95*  CO2 24  GLUCOSE 265*  BUN 52*  CREATININE 2.89*  CALCIUM 10.0   GFR: Estimated Creatinine Clearance: 12.4 mL/min (A) (by C-G formula based on SCr of 2.89 mg/dL (H)). Liver Function Tests: No results for input(s): AST, ALT, ALKPHOS, BILITOT, PROT, ALBUMIN in the last 168 hours. No results for input(s): LIPASE, AMYLASE in the last 168 hours. No results for input(s): AMMONIA in the last 168 hours. Coagulation Profile: Recent Labs  Lab 07/23/18 0105  INR 1.0   Cardiac Enzymes: No results for input(s): CKTOTAL, CKMB, CKMBINDEX, TROPONINI in the last 168 hours. BNP (last 3 results) No results for input(s): PROBNP in the last 8760 hours. HbA1C: No results for input(s): HGBA1C in the last 72 hours. CBG: No results for input(s): GLUCAP in the last 168 hours. Lipid Profile: No results for input(s): CHOL, HDL, LDLCALC, TRIG, CHOLHDL, LDLDIRECT in the last 72 hours. Thyroid Function Tests: No results for input(s): TSH, T4TOTAL, FREET4, T3FREE, THYROIDAB in the last 72 hours. Anemia Panel: No results for input(s): VITAMINB12, FOLATE, FERRITIN, TIBC, IRON, RETICCTPCT in the last 72 hours. Urine analysis:    Component Value Date/Time   COLORURINE YELLOW 07/19/2017 1430   APPEARANCEUR CLEAR 07/19/2017 1430   LABSPEC 1.004 (L) 07/19/2017 1430   PHURINE 7.0 07/19/2017 1430   GLUCOSEU NEGATIVE 07/19/2017 1430   HGBUR  NEGATIVE 07/19/2017 1430   BILIRUBINUR NEGATIVE 07/19/2017 1430   KETONESUR NEGATIVE 07/19/2017 1430   PROTEINUR NEGATIVE 07/19/2017 1430   UROBILINOGEN 0.2 07/29/2013 0926   NITRITE NEGATIVE 07/19/2017 1430   LEUKOCYTESUR NEGATIVE 07/19/2017 1430   Sepsis Labs: @LABRCNTIP (procalcitonin:4,lacticidven:4) )No results found for this or any previous visit (from the past 240 hour(s)).   Radiological Exams on Admission:  Ct Abdomen Pelvis Wo Contrast  Result Date: 07/23/2018 CLINICAL DATA:  Vaginal bleeding EXAM: CT ABDOMEN AND PELVIS WITHOUT CONTRAST TECHNIQUE: Multidetector CT imaging of the abdomen and pelvis was performed following the standard protocol without IV contrast. COMPARISON:  07/19/2017 FINDINGS: Lower chest:  Extensive atherosclerotic calcification Hepatobiliary: Hepatic steatosis.Cholelithiasis without evidence of inflammation or biliary obstruction. Pancreas: Unremarkable. Spleen: Unremarkable. Adrenals/Urinary Tract: Negative adrenals. No hydronephrosis or ureteral stone. Right calcifications are at least predominantly vascular. Unremarkable bladder. Stomach/Bowel: Diffuse formed stool. No bowel obstruction. No evidence of bowel inflammation. Vascular/Lymphatic: Extensive atherosclerotic calcification. Focal calcification of a peripheral ileocolic vessels is chronic. No mass or adenopathy. There is retroperitoneal stranding at the level of the pelvis that is stable from prior. Suspect this is related patient's history of radiotherapy. Reproductive:Hysterectomy.  No visualized ovaries. Other: No ascites or pneumoperitoneum. Musculoskeletal: No acute abnormalities. Disc narrowing and facet arthropathy with L4-5 anterolisthesis. IMPRESSION: 1. No acute finding or change from 07/19/2017. 2. Hysterectomy. 3. Hepatic steatosis and cholelithiasis. Electronically Signed   By: Monte Fantasia M.D.   On: 07/23/2018 04:23   US Pelvic Complete With Transvaginal  Result Date: 07/23/2018 CLINICAL  DATA:  Postmenopausal vaginal bleeding. Previous hysterectomy. EXAM: TRANSABDOMINAL AND TRANSVAGINAL ULTRASOUND OF PELVIS TECHNIQUE: Both transabdominal and transvaginal ultrasound examinations of the pelvis were performed. Transabdominal technique was performed for global imaging of the pelvis including uterus, ovaries, adnexal regions, and pelvic cul-de-sac. It was necessary to proceed with endovaginal exam following the transabdominal exam to visualize the ovaries. COMPARISON:  None FINDINGS: Uterus Uterus is surgically absent. Endometrium Surgically absent. Right ovary Not visualized. Left ovary Not visualized. Other findings No abnormal free fluid. Filled bladder is present without filling defect or wall thickening. IMPRESSION: Uterus is surgically absent. Ovaries are not visualized but no mass or abnormal fluid demonstrated in the visualized adnexal regions. Electronically Signed   By: Lucienne Capers M.D.   On: 07/23/2018 03:31    EKG: Not performed.   Assessment/Plan  1. Acute GI bleeding vs possible vaginal bleeding; macrocytic anemia   - Presents reports frequent dark red and maroon blood in her bowel movements that she had been told was from hemorrhoids, reports acute increase in bleeding since the afternoon of 3/10, but is not sure if it was coming from her rectum or vagina  - She was found to have blood and clots at the introitus in ED with no fistula or abnormal fluid on pelvic US and no acute findings on CT abd/pelvis  - Hgb is 7.9 on admission, down from 8.2 in July  - Type and screen, check FOBT, treat with Protonix for now given uncertainties, and follow serial H&H    2. CKD IV  - SCr is 2.89 on admission, similar to priors   - Renally-dose medications   3. Insulin-dependent DM  - A1c was 6.5% one yr ago  - Med-rec is pending  - Check CBG's and use a SSI with Novolog for now    4. Depression with anxiety  - Stable, continue medications pending med rec     DVT prophylaxis:  SCD's   Code Status: DNR  Family Communication: Discussed with patient  Consults called: None Admission status: Observation     Vianne Bulls, MD [facility] Pager [phone]  If 7PM-7AM, please contact night-coverage [email or web address] Password [facility]  07/23/2018, 4:52 AM

## 2018-07-23 NOTE — Progress Notes (Signed)
Provider Baltazar Najjar called back and state stated to hold Trazodone, Ropinirole, gabapentin, and Depakote mediations due to patient condition.

## 2018-07-23 NOTE — ED Triage Notes (Signed)
Pt complaing of bleeding from GI tract since yesterday afternon

## 2018-07-23 NOTE — ED Notes (Signed)
Diet tray ordered for pt 

## 2018-07-23 NOTE — ED Notes (Signed)
Np, [name], from pts facility called, wanting an update at [phone]

## 2018-07-23 NOTE — Progress Notes (Signed)
Pt alert and oriented x4. Pt remains lethargic, arousal to physical stimuli but then immediately falls asleep. Vitals WDL. HOB elevated to 45 degrees due to hx of dysphagia and lethargy. Pt has several night medications ordered such as trazodone and gabapentin. Provider paged and asked if medications are to be given with patient's current condition.

## 2018-07-24 DIAGNOSIS — Z794 Long term (current) use of insulin: Secondary | ICD-10-CM | POA: Diagnosis not present

## 2018-07-24 DIAGNOSIS — D539 Nutritional anemia, unspecified: Secondary | ICD-10-CM | POA: Diagnosis not present

## 2018-07-24 DIAGNOSIS — N184 Chronic kidney disease, stage 4 (severe): Secondary | ICD-10-CM | POA: Diagnosis not present

## 2018-07-24 DIAGNOSIS — Z515 Encounter for palliative care: Secondary | ICD-10-CM | POA: Diagnosis not present

## 2018-07-24 DIAGNOSIS — K625 Hemorrhage of anus and rectum: Secondary | ICD-10-CM | POA: Diagnosis present

## 2018-07-24 DIAGNOSIS — I130 Hypertensive heart and chronic kidney disease with heart failure and stage 1 through stage 4 chronic kidney disease, or unspecified chronic kidney disease: Secondary | ICD-10-CM | POA: Diagnosis present

## 2018-07-24 DIAGNOSIS — D62 Acute posthemorrhagic anemia: Secondary | ICD-10-CM | POA: Diagnosis present

## 2018-07-24 DIAGNOSIS — J9611 Chronic respiratory failure with hypoxia: Secondary | ICD-10-CM | POA: Diagnosis present

## 2018-07-24 DIAGNOSIS — E669 Obesity, unspecified: Secondary | ICD-10-CM | POA: Diagnosis present

## 2018-07-24 DIAGNOSIS — D631 Anemia in chronic kidney disease: Secondary | ICD-10-CM | POA: Diagnosis present

## 2018-07-24 DIAGNOSIS — I69354 Hemiplegia and hemiparesis following cerebral infarction affecting left non-dominant side: Secondary | ICD-10-CM | POA: Diagnosis not present

## 2018-07-24 DIAGNOSIS — N179 Acute kidney failure, unspecified: Secondary | ICD-10-CM | POA: Diagnosis not present

## 2018-07-24 DIAGNOSIS — G20 Parkinson's disease: Secondary | ICD-10-CM | POA: Diagnosis present

## 2018-07-24 DIAGNOSIS — F418 Other specified anxiety disorders: Secondary | ICD-10-CM | POA: Diagnosis not present

## 2018-07-24 DIAGNOSIS — K760 Fatty (change of) liver, not elsewhere classified: Secondary | ICD-10-CM | POA: Diagnosis present

## 2018-07-24 DIAGNOSIS — I5032 Chronic diastolic (congestive) heart failure: Secondary | ICD-10-CM | POA: Diagnosis present

## 2018-07-24 DIAGNOSIS — K5731 Diverticulosis of large intestine without perforation or abscess with bleeding: Secondary | ICD-10-CM | POA: Diagnosis present

## 2018-07-24 DIAGNOSIS — Z66 Do not resuscitate: Secondary | ICD-10-CM | POA: Diagnosis not present

## 2018-07-24 DIAGNOSIS — Z9981 Dependence on supplemental oxygen: Secondary | ICD-10-CM | POA: Diagnosis not present

## 2018-07-24 DIAGNOSIS — M109 Gout, unspecified: Secondary | ICD-10-CM | POA: Diagnosis present

## 2018-07-24 DIAGNOSIS — E875 Hyperkalemia: Secondary | ICD-10-CM | POA: Diagnosis not present

## 2018-07-24 DIAGNOSIS — Z7952 Long term (current) use of systemic steroids: Secondary | ICD-10-CM | POA: Diagnosis not present

## 2018-07-24 DIAGNOSIS — K922 Gastrointestinal hemorrhage, unspecified: Secondary | ICD-10-CM | POA: Diagnosis not present

## 2018-07-24 DIAGNOSIS — J449 Chronic obstructive pulmonary disease, unspecified: Secondary | ICD-10-CM | POA: Diagnosis present

## 2018-07-24 DIAGNOSIS — K219 Gastro-esophageal reflux disease without esophagitis: Secondary | ICD-10-CM | POA: Diagnosis present

## 2018-07-24 DIAGNOSIS — I700 Atherosclerosis of aorta: Secondary | ICD-10-CM | POA: Diagnosis present

## 2018-07-24 DIAGNOSIS — Z7189 Other specified counseling: Secondary | ICD-10-CM | POA: Diagnosis not present

## 2018-07-24 DIAGNOSIS — E1122 Type 2 diabetes mellitus with diabetic chronic kidney disease: Secondary | ICD-10-CM | POA: Diagnosis present

## 2018-07-24 DIAGNOSIS — E785 Hyperlipidemia, unspecified: Secondary | ICD-10-CM | POA: Diagnosis present

## 2018-07-24 LAB — BASIC METABOLIC PANEL
Anion gap: 13 (ref 5–15)
BUN: 51 mg/dL — ABNORMAL HIGH (ref 8–23)
CO2: 27 mmol/L (ref 22–32)
Calcium: 10 mg/dL (ref 8.9–10.3)
Chloride: 97 mmol/L — ABNORMAL LOW (ref 98–111)
Creatinine, Ser: 2.85 mg/dL — ABNORMAL HIGH (ref 0.44–1.00)
GFR calc Af Amer: 17 mL/min — ABNORMAL LOW (ref >60)
GFR calc non Af Amer: 14 mL/min — ABNORMAL LOW (ref >60)
GLUCOSE: 213 mg/dL — AB (ref 70–99)
Potassium: 5.5 mmol/L — ABNORMAL HIGH (ref 3.5–5.1)
Sodium: 137 mmol/L (ref 135–145)

## 2018-07-24 LAB — GLUCOSE, CAPILLARY
GLUCOSE-CAPILLARY: 179 mg/dL — AB (ref 70–99)
Glucose-Capillary: 146 mg/dL — ABNORMAL HIGH (ref 70–99)
Glucose-Capillary: 185 mg/dL — ABNORMAL HIGH (ref 70–99)
Glucose-Capillary: 181 mg/dL — ABNORMAL HIGH (ref 70–99)
Glucose-Capillary: 196 mg/dL — ABNORMAL HIGH (ref 70–99)

## 2018-07-24 LAB — HEMOGLOBIN AND HEMATOCRIT, BLOOD
HCT: 26.1 % — ABNORMAL LOW (ref 36.0–46.0)
Hemoglobin: 8.1 g/dL — ABNORMAL LOW (ref 12.0–15.0)

## 2018-07-24 LAB — CBC
HCT: 27.9 % — ABNORMAL LOW (ref 36.0–46.0)
Hemoglobin: 8.9 g/dL — ABNORMAL LOW (ref 12.0–15.0)
MCH: 31.7 pg (ref 26.0–34.0)
MCHC: 31.9 g/dL (ref 30.0–36.0)
MCV: 99.3 fL (ref 80.0–100.0)
Platelets: 273 10*3/uL (ref 150–400)
RBC: 2.81 MIL/uL — ABNORMAL LOW (ref 3.87–5.11)
RDW: 18.2 % — ABNORMAL HIGH (ref 11.5–15.5)
WBC: 10.8 10*3/uL — ABNORMAL HIGH (ref 4.0–10.5)
nRBC: 0 % (ref 0.0–0.2)

## 2018-07-24 LAB — MRSA PCR SCREENING: MRSA by PCR: NEGATIVE

## 2018-07-24 MED ORDER — FERROUS SULFATE 325 (65 FE) MG PO TABS
325.0000 mg | ORAL_TABLET | Freq: Every day | ORAL | Status: DC
  Administered 2018-07-26 – 2018-07-31 (×6): 325 mg via ORAL
  Filled 2018-07-24 (×6): qty 1

## 2018-07-24 MED ORDER — SODIUM CHLORIDE 0.9 % IV SOLN
INTRAVENOUS | Status: DC
  Administered 2018-07-24: 07:00:00 via INTRAVENOUS

## 2018-07-24 MED ORDER — HYDROCORTISONE ACETATE 25 MG RE SUPP
25.0000 mg | Freq: Two times a day (BID) | RECTAL | Status: DC
  Administered 2018-07-26 – 2018-07-27 (×2): 25 mg via RECTAL
  Filled 2018-07-24 (×14): qty 1

## 2018-07-24 MED ORDER — TORSEMIDE 20 MG PO TABS
40.0000 mg | ORAL_TABLET | Freq: Every day | ORAL | Status: DC
  Administered 2018-07-25 – 2018-07-26 (×2): 40 mg via ORAL
  Filled 2018-07-24 (×2): qty 2

## 2018-07-24 NOTE — Progress Notes (Signed)
Telemetry MX40-04 placed on pt at this time per orders. Telemetry tech notified. Telemetry box and correct pt verified by Estill Bamberg NT

## 2018-07-24 NOTE — Progress Notes (Signed)
Physical Therapy Evaluation Patient Details Name: [name] MRN: [id number] DOB: 02/19/1929 Today's Date: 07/24/2018   History of Present Illness  Patient is 85 y/o female presenting to hospital with possible acute GI bleed. CT of abdomen negative for any acute finding. PMH includes DM, uterine cancer s/p hysterectomy, gout, anemia, depression, HTN, and CVA with L sided deficits.    Clinical Impression  Patient admitted to hospital secondary to problems above and with deficits below. Patient required minA for bed mobility. Attempted to stand with use of RW but unable to perform with +1 secondary to weakness. Given functional mobility deficits, recommend return to SNF following d/c. Patient will benefit from acute physical therapy to maximize independence and safety with functional mobility.    Follow Up Recommendations SNF;Supervision/Assistance - 24 hour    Equipment Recommendations  Other (comment)(TBD at next venue)    Recommendations for Other Services       Precautions / Restrictions Precautions Precautions: Fall Restrictions Weight Bearing Restrictions: (P) No      Mobility  Bed Mobility Overal bed mobility: Needs Assistance Bed Mobility: Supine to Sit;Sit to Supine     Supine to sit: Min assist Sit to supine: Min assist   General bed mobility comments: Patient required minA for trunk control and LE management for all bed mobility. Patient able to maintain sitting balance briefly without UE but required minA or 1UE assist. Verbal cues for upright posture to prevent posterior lean. Used bed pad to scoot hips towards EOB  Transfers Overall transfer level: Needs assistance Equipment used: Rolling walker (2 wheeled) Transfers: Sit to/from Stand           General transfer comment: Attempted sit to stand transfer with use of RW. Unable to perform with +1 secondary to LE weakness.  Ambulation/Gait                Stairs            Wheelchair  Mobility    Modified Rankin (Stroke Patients Only)       Balance Overall balance assessment: Needs assistance Sitting-balance support: Single extremity supported;Feet supported Sitting balance-Leahy Scale: Poor Sitting balance - Comments: Patient required UE support to maintain sitting balance Postural control: Posterior lean                                   Pertinent Vitals/Pain Pain Assessment: No/denies pain    Home Living Family/patient expects to be discharged to:: Skilled nursing facility                      Prior Function Level of Independence: Needs assistance   Gait / Transfers Assistance Needed: Reports needing assistance for transfers. States she has been practicing walking with RW. Uses WC for primary mobility.   ADL's / Homemaking Assistance Needed: Requires assistance for ADLs        Hand Dominance        Extremity/Trunk Assessment   Upper Extremity Assessment Upper Extremity Assessment: LUE deficits/detail;Generalized weakness LUE Deficits / Details: LUE deficits secondary to CVA    Lower Extremity Assessment Lower Extremity Assessment: Generalized weakness;LLE deficits/detail LLE Deficits / Details: LLE deficits secondary to CVA    Cervical / Trunk Assessment Cervical / Trunk Assessment: Normal  Communication   Communication: No difficulties  Cognition Arousal/Alertness: Awake/alert Behavior During Therapy: WFL for tasks assessed/performed Overall Cognitive Status: No family/caregiver present to determine baseline  cognitive functioning                                 General Comments: Patient very pleasant. A&Ox4      General Comments General comments (skin integrity, edema, etc.): No family or caregiver present during session    Exercises     Assessment/Plan    PT Assessment Patient needs continued PT services  PT Problem List Decreased strength;Decreased range of motion;Decreased activity  tolerance;Decreased balance;Decreased mobility;Decreased cognition;Decreased knowledge of use of DME;Decreased coordination       PT Treatment Interventions DME instruction;Gait training;Functional mobility training;Therapeutic activities;Therapeutic exercise;Balance training;Neuromuscular re-education;Cognitive remediation;Patient/family education    PT Goals (Current goals can be found in the Care Plan section)  Acute Rehab PT Goals Patient Stated Goal: "sit up in bed" PT Goal Formulation: With patient Time For Goal Achievement: 08/07/18 Potential to Achieve Goals: Fair    Frequency Min 2X/week   Barriers to discharge        Co-evaluation               AM-PAC PT "6 Clicks" Mobility  Outcome Measure Help needed turning from your back to your side while in a flat bed without using bedrails?: A Little Help needed moving from lying on your back to sitting on the side of a flat bed without using bedrails?: A Little Help needed moving to and from a bed to a chair (including a wheelchair)?: Total Help needed standing up from a chair using your arms (e.g., wheelchair or bedside chair)?: Total Help needed to walk in hospital room?: Total Help needed climbing 3-5 steps with a railing? : Total 6 Click Score: 10    End of Session Equipment Utilized During Treatment: Gait belt Activity Tolerance: Patient tolerated treatment well Patient left: in bed;with call bell/phone within reach;with bed alarm set Nurse Communication: Mobility status PT Visit Diagnosis: Other abnormalities of gait and mobility (R26.89);Muscle weakness (generalized) (M62.81);Difficulty in walking, not elsewhere classified (R26.2);Hemiplegia and hemiparesis Hemiplegia - Right/Left: Left Hemiplegia - caused by: Cerebral infarction    Time: 1216-2446 PT Time Calculation (min) (ACUTE ONLY): 20 min   Charges:   PT Evaluation $PT Eval Moderate Complexity: 1 Mod          Erick Blinks, SPT  Erick Blinks 07/24/2018, 6:40 PM

## 2018-07-24 NOTE — Progress Notes (Signed)
Pt is now more alert and easily arouse. Pt more talkative. PT sitting upright watching television. No acute distress noted.

## 2018-07-24 NOTE — Progress Notes (Signed)
Progress Note    [name]  GUY:[id number] DOB: 12-09-1928  DOA: 07/23/2018 PCP: Josetta Huddle, MD    Brief Narrative:   Chief complaint: Rectal bleeding  Medical records reviewed and are as summarized below:  [name] is an 85 y.o. female with past medical history significant for uterine cancer status post hysterectomy and radiation, chronic diastolic CHF, IDDM, CKD stage IV, depression, anxiety, and chronic microcytic anemia; who presented to the emergency department nursing facility with rectal bleeding.  GI was consulted, but after discussions with family will try and avoid colonoscopy.  Assessment/Plan:   Principal Problem:   Acute GI bleeding Active Problems:   Macrocytic anemia   Type 2 diabetes mellitus with stage 4 chronic kidney disease (HCC)   GI bleed   CKD (chronic kidney disease), stage IV (HCC)   Depression with anxiety  1. Acute GI bleeding; macrocytic anemia: Presents reports frequent dark red and maroon blood in her bowel movements that she had been told was from hemorrhoids, reports acute increase in bleeding since the afternoon of 3/10, determined to likely be coming from rectum. In ED with no fistula or abnormal fluid on pelvic US and no acute findings on CT abd/pelvis.  Gastroenterology consulted, but agreed with family against colonoscopy at this time.  Hgb dropped 7.9 down to 6.8 on repeat check and patient was transfused 2 units of packed red blood cells with repeat check 8.8 g/dL posttransfusion, but nursing staff reporting bleeding overnight.  On hospital day 2 hemoglobin trended 8.9-> 8.1.  No further bleeding noted during the day. -Monitor overnight -Recheck hemoglobin in a.m. -If bleeding presist may warrant red tagged bleeding scan at that time. -Appreciate GI consultative services  2.  Chronic diastolic congestive heart failure: Patient appears to be euvolemic. -Strict I&Os -Continue torsemide   3.  CKD IV  - SCr is 2.89 on  admission, slightly increased from prior - Renally-dose medications  4.  Insulin-dependent DM : Last A1c was 6.5% one yr ago. - Check CBG's and use a SSI with Novolog for now    5. Depression with anxiety  - Stable, continue Depakote and trazodone    6.  Hyperkalemia: Acute.  Potassium noted to be elevated at 5.5.  -Gentle IV fluids of normal saline at 50 mL/h -Recheck potassium in a.m.  Body mass index is 32.22 kg/m.   Family Communication/Anticipated D/C date and plan/Code Status   DVT prophylaxis: SCDs Code Status: Full Code.  Family Communication: No family present at bedside Disposition Plan: Likely discharge back to skilled nursing facility once medically stable   Medical Consultants:    Gastroenterology Dr. Watt Climes   Anti-Infectives:    None  Subjective:   Patient reported to have some bleeding overnight, but none noted during today.  Objective:    Vitals:   07/23/18 1755 07/23/18 2338 07/24/18 0300 07/24/18 1141  BP: (!) 158/58 (!) 136/55 (!) 154/58 (!) 127/58  Pulse: 68 68 68 70  Resp: 16 18 18 18   Temp: 98.4 F (36.9 C) 98 F (36.7 C) 97.9 F (36.6 C) 97.7 F (36.5 C)  TempSrc: Oral Oral Oral Oral  SpO2: 95% 94% 95% 95%  Weight:      Height:        Intake/Output Summary (Last 24 hours) at 07/24/2018 1552 Last data filed at 07/24/2018 0809 Gross per 24 hour  Intake 158.54 ml  Output 700 ml  Net -541.46 ml   Filed Weights   07/23/18 0045  Weight: 74.8 kg    Exam: Constitutional: Elderly female NAD, calm, comfortable Eyes: PERRL, lids and conjunctivae normal ENMT: Mucous membranes are moist. Posterior pharynx clear of any exudate or lesions. Hard of hearing Neck: normal, supple, no masses, no thyromegaly Respiratory: clear to auscultation bilaterally, no wheezing, no crackles. Normal respiratory effort. No accessory muscle use.  Cardiovascular: Regular rate and rhythm, no murmurs / rubs / gallops. No extremity edema. 2+ pedal pulses. No  carotid bruits.  Abdomen: no tenderness, no masses palpated. No hepatosplenomegaly. Bowel sounds positive.  Musculoskeletal: no clubbing / cyanosis. No joint deformity upper and lower extremities. Good ROM, no contractures. Normal muscle tone.  Skin: no rashes, lesions, ulcers. No induration Neurologic: CN 2-12 grossly intact. Sensation intact, DTR normal. Strength 5/5 in all 4.  Pin rolling tremor present in right hand. Psychiatric: Normal judgment and insight. Alert and oriented x 2. Normal mood.    Data Reviewed:   I have personally reviewed following labs and imaging studies:  Labs: Labs show the following:   Basic Metabolic Panel: Recent Labs  Lab 07/23/18 0105 07/24/18 0026  NA 133* 137  K 4.6 5.5*  CL 95* 97*  CO2 24 27  GLUCOSE 265* 213*  BUN 52* 51*  CREATININE 2.89* 2.85*  CALCIUM 10.0 10.0   GFR Estimated Creatinine Clearance: 12.6 mL/min (A) (by C-G formula based on SCr of 2.85 mg/dL (H)). Liver Function Tests: No results for input(s): AST, ALT, ALKPHOS, BILITOT, PROT, ALBUMIN in the last 168 hours. No results for input(s): LIPASE, AMYLASE in the last 168 hours. No results for input(s): AMMONIA in the last 168 hours. Coagulation profile Recent Labs  Lab 07/23/18 0105  INR 1.0    CBC: Recent Labs  Lab 07/23/18 0105 07/23/18 0840 07/23/18 1753 07/24/18 0026 07/24/18 1400  WBC 10.1  --   --  10.8*  --   NEUTROABS 6.1  --   --   --   --   HGB 7.9* 6.8* 8.8* 8.9* 8.1*  HCT 25.7* 21.4* 27.5* 27.9* 26.1*  MCV 105.8*  --   --  99.3  --   PLT 288  --   --  273  --    Cardiac Enzymes: No results for input(s): CKTOTAL, CKMB, CKMBINDEX, TROPONINI in the last 168 hours. BNP (last 3 results) No results for input(s): PROBNP in the last 8760 hours. CBG: Recent Labs  Lab 07/23/18 2353 07/24/18 0424 07/24/18 0748 07/24/18 1051 07/24/18 1539  GLUCAP 213* 146* 185* 181* 196*   D-Dimer: No results for input(s): DDIMER in the last 72 hours. Hgb A1c: No  results for input(s): HGBA1C in the last 72 hours. Lipid Profile: No results for input(s): CHOL, HDL, LDLCALC, TRIG, CHOLHDL, LDLDIRECT in the last 72 hours. Thyroid function studies: No results for input(s): TSH, T4TOTAL, T3FREE, THYROIDAB in the last 72 hours.  Invalid input(s): FREET3 Anemia work up: No results for input(s): VITAMINB12, FOLATE, FERRITIN, TIBC, IRON, RETICCTPCT in the last 72 hours. Sepsis Labs: Recent Labs  Lab 07/23/18 0105 07/24/18 0026  WBC 10.1 10.8*    Microbiology Recent Results (from the past 240 hour(s))  MRSA PCR Screening     Status: None   Collection Time: 07/24/18  4:29 AM  Result Value Ref Range Status   MRSA by PCR NEGATIVE NEGATIVE Final    Comment:        The GeneXpert MRSA Assay (FDA approved for NASAL specimens only), is one component of a comprehensive MRSA colonization surveillance program. It is  not intended to diagnose MRSA infection nor to guide or monitor treatment for MRSA infections. Performed at [facility], [place] [address], [place], [place] [address]     Procedures and diagnostic studies:  Ct Abdomen Pelvis Wo Contrast  Result Date: 07/23/2018 CLINICAL DATA:  Vaginal bleeding EXAM: CT ABDOMEN AND PELVIS WITHOUT CONTRAST TECHNIQUE: Multidetector CT imaging of the abdomen and pelvis was performed following the standard protocol without IV contrast. COMPARISON:  07/19/2017 FINDINGS: Lower chest:  Extensive atherosclerotic calcification Hepatobiliary: Hepatic steatosis.Cholelithiasis without evidence of inflammation or biliary obstruction. Pancreas: Unremarkable. Spleen: Unremarkable. Adrenals/Urinary Tract: Negative adrenals. No hydronephrosis or ureteral stone. Right calcifications are at least predominantly vascular. Unremarkable bladder. Stomach/Bowel: Diffuse formed stool. No bowel obstruction. No evidence of bowel inflammation. Vascular/Lymphatic: Extensive atherosclerotic calcification. Focal calcification of a  peripheral ileocolic vessels is chronic. No mass or adenopathy. There is retroperitoneal stranding at the level of the pelvis that is stable from prior. Suspect this is related patient's history of radiotherapy. Reproductive:Hysterectomy.  No visualized ovaries. Other: No ascites or pneumoperitoneum. Musculoskeletal: No acute abnormalities. Disc narrowing and facet arthropathy with L4-5 anterolisthesis. IMPRESSION: 1. No acute finding or change from 07/19/2017. 2. Hysterectomy. 3. Hepatic steatosis and cholelithiasis. Electronically Signed   By: Monte Fantasia M.D.   On: 07/23/2018 04:23   US Pelvic Complete With Transvaginal  Result Date: 07/23/2018 CLINICAL DATA:  Postmenopausal vaginal bleeding. Previous hysterectomy. EXAM: TRANSABDOMINAL AND TRANSVAGINAL ULTRASOUND OF PELVIS TECHNIQUE: Both transabdominal and transvaginal ultrasound examinations of the pelvis were performed. Transabdominal technique was performed for global imaging of the pelvis including uterus, ovaries, adnexal regions, and pelvic cul-de-sac. It was necessary to proceed with endovaginal exam following the transabdominal exam to visualize the ovaries. COMPARISON:  None FINDINGS: Uterus Uterus is surgically absent. Endometrium Surgically absent. Right ovary Not visualized. Left ovary Not visualized. Other findings No abnormal free fluid. Filled bladder is present without filling defect or wall thickening. IMPRESSION: Uterus is surgically absent. Ovaries are not visualized but no mass or abnormal fluid demonstrated in the visualized adnexal regions. Electronically Signed   By: Lucienne Capers M.D.   On: 07/23/2018 03:31    Medications:   . allopurinol  150 mg Oral QHS  . divalproex  125 mg Oral Daily  . divalproex  250 mg Oral QHS  . gabapentin  400 mg Oral QHS  . insulin aspart  0-9 Units Subcutaneous Q4H  . ipratropium-albuterol  3 mL Nebulization TID  . pantoprazole (PROTONIX) IV  40 mg Intravenous Q12H  . rOPINIRole  1 mg  Oral BID  . torsemide  40 mg Oral Daily  . traZODone  50 mg Oral QHS   Continuous Infusions: . sodium chloride 50 mL/hr at 07/24/18 0809     LOS: 0 days   [name] A Tondra Reierson  [facility]   *Please refer to [email or web address], password [email or web address] to get updated schedule on who will round on this patient, as hospitalists switch teams weekly. If 7PM-7AM, please contact night-coverage at [email or web address], password [email or web address] for any overnight needs.

## 2018-07-24 NOTE — Progress Notes (Signed)
[name] 6:10 PM  Subjective: Patient with no signs of ongoing bleeding today and case discussed with the hospital team and her nurse and she is more alert today and has no complaints and wants to eat a hamburger  Objective: Vital signs stable afebrile no acute distress lying comfortably in the bed answering all questions appropriately abdomen is soft nontender hemoglobin slight drop to 8.1  Assessment: Lower GI bleeding probably diverticuli or AVM with negative CT  Plan: Would proceed with nuclear bleeding scan if signs of active bleeding but since no signs of bleeding today can hold off at this time and will check on tomorrow and consider advancing diet tomorrow if no signs of bleeding  [facility]  Pager [phone] After 5PM or if no answer call [phone]

## 2018-07-25 ENCOUNTER — Inpatient Hospital Stay (HOSPITAL_COMMUNITY): Payer: Medicare Other

## 2018-07-25 DIAGNOSIS — M109 Gout, unspecified: Secondary | ICD-10-CM

## 2018-07-25 HISTORY — PX: IR ANGIOGRAM VISCERAL SELECTIVE: IMG657

## 2018-07-25 HISTORY — PX: IR US GUIDE VASC ACCESS RIGHT: IMG2390

## 2018-07-25 LAB — BASIC METABOLIC PANEL
Anion gap: 13 (ref 5–15)
BUN: 47 mg/dL — ABNORMAL HIGH (ref 8–23)
CO2: 28 mmol/L (ref 22–32)
Calcium: 10.1 mg/dL (ref 8.9–10.3)
Chloride: 96 mmol/L — ABNORMAL LOW (ref 98–111)
Creatinine, Ser: 2.8 mg/dL — ABNORMAL HIGH (ref 0.44–1.00)
GFR calc Af Amer: 17 mL/min — ABNORMAL LOW (ref >60)
GFR calc non Af Amer: 15 mL/min — ABNORMAL LOW (ref >60)
Glucose, Bld: 175 mg/dL — ABNORMAL HIGH (ref 70–99)
Potassium: 4.1 mmol/L (ref 3.5–5.1)
Sodium: 137 mmol/L (ref 135–145)

## 2018-07-25 LAB — HEMOGLOBIN AND HEMATOCRIT, BLOOD
HCT: 25.6 % — ABNORMAL LOW (ref 36.0–46.0)
HCT: 24 % — ABNORMAL LOW (ref 36.0–46.0)
HCT: 21.7 % — ABNORMAL LOW (ref 36.0–46.0)
Hemoglobin: 7.9 g/dL — ABNORMAL LOW (ref 12.0–15.0)
Hemoglobin: 7.5 g/dL — ABNORMAL LOW (ref 12.0–15.0)
Hemoglobin: 6.8 g/dL — CL (ref 12.0–15.0)

## 2018-07-25 LAB — CBC
HEMATOCRIT: 28.7 % — AB (ref 36.0–46.0)
Hemoglobin: 9 g/dL — ABNORMAL LOW (ref 12.0–15.0)
MCH: 31.5 pg (ref 26.0–34.0)
MCHC: 31.4 g/dL (ref 30.0–36.0)
MCV: 100.3 fL — ABNORMAL HIGH (ref 80.0–100.0)
Platelets: 235 10*3/uL (ref 150–400)
RBC: 2.86 MIL/uL — ABNORMAL LOW (ref 3.87–5.11)
RDW: 17.3 % — ABNORMAL HIGH (ref 11.5–15.5)
WBC: 8.8 10*3/uL (ref 4.0–10.5)
nRBC: 0 % (ref 0.0–0.2)

## 2018-07-25 LAB — GLUCOSE, CAPILLARY
Glucose-Capillary: 152 mg/dL — ABNORMAL HIGH (ref 70–99)
Glucose-Capillary: 143 mg/dL — ABNORMAL HIGH (ref 70–99)
Glucose-Capillary: 189 mg/dL — ABNORMAL HIGH (ref 70–99)

## 2018-07-25 LAB — PREPARE RBC (CROSSMATCH)

## 2018-07-25 MED ORDER — LIDOCAINE HCL 1 % IJ SOLN
INTRAMUSCULAR | Status: AC
  Filled 2018-07-25: qty 20

## 2018-07-25 MED ORDER — SODIUM CHLORIDE 0.9 % IV SOLN
INTRAVENOUS | Status: AC
  Administered 2018-07-25: 22:00:00 via INTRAVENOUS

## 2018-07-25 MED ORDER — TECHNETIUM TC 99M-LABELED RED BLOOD CELLS IV KIT
25.0000 | PACK | Freq: Once | INTRAVENOUS | Status: AC | PRN
  Administered 2018-07-25: 25 via INTRAVENOUS

## 2018-07-25 MED ORDER — IPRATROPIUM-ALBUTEROL 0.5-2.5 (3) MG/3ML IN SOLN
3.0000 mL | Freq: Two times a day (BID) | RESPIRATORY_TRACT | Status: DC
  Administered 2018-07-25 – 2018-07-26 (×2): 3 mL via RESPIRATORY_TRACT
  Filled 2018-07-25 (×2): qty 3

## 2018-07-25 MED ORDER — SODIUM CHLORIDE 0.9% IV SOLUTION
Freq: Once | INTRAVENOUS | Status: AC
  Administered 2018-07-25: 23:00:00 via INTRAVENOUS

## 2018-07-25 MED ORDER — MIDAZOLAM HCL 2 MG/2ML IJ SOLN
INTRAMUSCULAR | Status: AC
  Filled 2018-07-25: qty 2

## 2018-07-25 MED ORDER — LIDOCAINE HCL (PF) 1 % IJ SOLN
INTRAMUSCULAR | Status: AC | PRN
  Administered 2018-07-25: 5 mL

## 2018-07-25 MED ORDER — MIDAZOLAM HCL 2 MG/2ML IJ SOLN
INTRAMUSCULAR | Status: AC | PRN
  Administered 2018-07-25: 0.5 mg via INTRAVENOUS

## 2018-07-25 MED ORDER — IOHEXOL 300 MG/ML  SOLN
100.0000 mL | Freq: Once | INTRAMUSCULAR | Status: AC | PRN
  Administered 2018-07-25: 60 mL via INTRAVENOUS

## 2018-07-25 NOTE — Consult Note (Signed)
Chief Complaint: Patient was seen in consultation today for GI bleed  Referring Physician(s): Dr. Clarene Essex  Supervising Physician: Corrie Mckusick  Patient Status: [facility] - In-pt  History of Present Illness: [name] is a 85 y.o. female with COPD, DM, GERD, left hemiplegia/hemiparesis from remote CVA presented to [facility] ED with acute rectal bleeding.  Initial concern for rectovaginal fistula due to blood in the introitus, however over the past 2 days has had intermittent bleeding which appears to be from rectum only. GI has consulted on the patient, however would prefer to avoid colonoscopy if possible. Patient had another episode of rectal bleeding this AM prompting nuclear medicine scan.    NM Blood Loss scan: There is abnormal activity within the proximal to mid transverse colon, leading to intraluminal activity within the distal transverse colon, splenic flexure, and proximal descending colon. Expected blood pool activity elsewhere.  IR consulted for mesenteric angiogram with possible intervention.  Patient assessed at bedside.  She is alert and cooperative. She has a legal guardian.  Tells me repeatedly "my hemorrhoids are acting up."  Sipping on clear liquids.   Discussed case with Dr. Earleen Newport who approves patient for mesenteric angiogram provided patient/family are aware and accepting of contrast-induced renal failure with potential of non-visualization on angiogram. Her SCr today is 2.8.  Past Medical History:  Diagnosis Date   Acute on chronic respiratory failure (HCC)    Anemia    Cancer ([place]) 07/04/10   endometrial   Chronic pain    COPD (chronic obstructive pulmonary disease) (HCC)    Diabetes mellitus    Dysphagia    Falling episodes    GERD (gastroesophageal reflux disease)    Gout    Hemiplegia and hemiparesis following cerebral infarction affecting left non-dominant side (HCC)    Hx of radiation therapy 09/13/10- 10/19/10, 6/12, 6/19, 11/14/10    pelvis, external beam tehn brachytherapy   Hypertension    Parkinson's disease ([place])    Pneumonia    Renal disorder    Spondylosis without myelopathy or radiculopathy    Stroke ([place])    L sided deficits w/slurred speech   Unspecified right bundle-branch block     Past Surgical History:  Procedure Laterality Date   ABDOMINAL HYSTERECTOMY      Allergies: Penicillins  Medications: Prior to Admission medications   Medication Sig Start Date End Date Taking? Authorizing Provider  acetaminophen (TYLENOL) 325 MG tablet Take 650 mg by mouth See admin instructions. Take 650 mg by mouth three times a day and 650 mg every 8 hours as needed for pain   Yes [provider]  albuterol (PROVENTIL) (2.5 MG/3ML) 0.083% nebulizer solution Take 2.5 mg by nebulization every 2 (two) hours as needed for wheezing.   Yes [provider]  allopurinol (ZYLOPRIM) 300 MG tablet Take 150 mg by mouth at bedtime.   Yes [provider]  bisoprolol (ZEBETA) 10 MG tablet Take 1 tablet (10 mg total) by mouth daily. 07/23/13  Yes Josetta Huddle, MD  Carboxymethylcellulose Sod PF 0.5 % SOLN Place 1 drop into both eyes 2 (two) times daily.   Yes [provider]  Cholecalciferol (VITAMIN D3) 50 MCG (2000 UT) TABS Take 8,000 Units by mouth daily.   Yes [provider]  divalproex (DEPAKOTE SPRINKLE) 125 MG capsule 250 mg every morning and 125 mg in the afternoon Patient taking differently: Take 125-250 mg by mouth See admin instructions. Take 125 mg by mouth at noontime and 250 mg at bedtime  11/15/17  Yes Emokpae, Courage, MD  divalproex (DEPAKOTE) 125 MG DR tablet Take 250 mg by mouth daily after breakfast.   Yes [provider]  ferrous sulfate 325 (65 FE) MG tablet Take 325 mg by mouth daily with breakfast.   Yes [provider]  FLUoxetine (PROZAC) 20 MG tablet Take 30 mg by mouth daily.   Yes [provider]  gabapentin (NEURONTIN) 100 MG  capsule Take 200 mg by mouth 2 (two) times daily.   Yes [provider]  gabapentin (NEURONTIN) 400 MG capsule Take 400 mg by mouth at bedtime.   Yes [provider]  hydrALAZINE (APRESOLINE) 25 MG tablet Take 1 tablet (25 mg total) by mouth 3 (three) times daily. Patient taking differently: Take 25 mg by mouth 4 (four) times daily.  11/15/17  Yes Emokpae, Courage, MD  hydrocortisone (ANUSOL-HC) 25 MG suppository Place 25 mg rectally 2 (two) times daily. 07/23/18 07/29/18 Yes [provider]  insulin aspart (NOVOLOG) 100 UNIT/ML injection Inject 0-15 Units into the skin 3 (three) times daily with meals. Patient taking differently: Inject 0-12 Units into the skin See admin instructions. Inject 0-12 units into the skin 2 times a day, per sliding scale: BGL 60-150 = 0 unit(s); 151-200 = 2 units; 201-250 = 6 units; 251-300 = 8 units; 301-350 = 10 units; 351-400 = 12 units; [phone] = CALL MD 11/15/17  Yes Emokpae, Courage, MD  Insulin Glargine (BASAGLAR KWIKPEN) 100 UNIT/ML SOPN Inject 55 Units into the skin at bedtime.   Yes [provider]  linagliptin (TRADJENTA) 5 MG TABS tablet Take 5 mg by mouth daily.   Yes [provider]  magnesium oxide (MAG-OX) 400 MG tablet Take 400 mg by mouth daily.   Yes [provider]  NON FORMULARY Take 120 mLs by mouth See admin instructions. MedPass; Drink 120 ml's by mouth once a day   Yes [provider]  Olopatadine HCl 0.2 % SOLN Place 1 drop into both eyes daily. 07/08/17  Yes [provider]  OXYGEN Inhale 2 L into the lungs as needed (FOR SATS <90%).    Yes [provider]  pantoprazole (PROTONIX) 20 MG tablet Take 20 mg by mouth 2 (two) times daily.  07/04/17  Yes [provider]  rOPINIRole (REQUIP) 1 MG tablet Take 1 mg by mouth 2 (two) times daily.   Yes [provider]  torsemide (DEMADEX) 20 MG tablet Take 2 tablets (40 mg total) by mouth daily. 07/23/17  Yes  Eugenie Filler, MD  traMADol (ULTRAM) 50 MG tablet Take 0.5 tablets (25 mg total) by mouth every 12 (twelve) hours as needed for moderate pain. Patient taking differently: Take 25 mg by mouth every 12 (twelve) hours as needed (for pain).  11/15/17  Yes Emokpae, Courage, MD  vitamin B-12 (CYANOCOBALAMIN) 1000 MCG tablet Take 1,000 mcg by mouth daily.   Yes [provider]  Dextromethorphan-guaiFENesin 10-100 MG/5ML liquid Take 10 mLs by mouth every 4 (four) hours as needed (cough).    [provider]  fluticasone (FLONASE) 50 MCG/ACT nasal spray Place 1 spray into both nostrils daily.    [provider]  ipratropium-albuterol (DUONEB) 0.5-2.5 (3) MG/3ML SOLN Take 3 mLs by nebulization 3 (three) times daily. Patient not taking: Reported on 07/23/2018 11/15/17   Roxan Hockey, MD  LEVEMIR FLEXTOUCH 100 UNIT/ML Pen Inject 45 Units into the skin daily. 10/18/17   [provider]  Menthol, Topical Analgesic, (BIOFREEZE ROLL-ON) 4 % GEL Apply  1 application topically 2 (two) times daily.    [provider]  predniSONE (DELTASONE) 20 MG tablet Take 1 tablet (20 mg total) by mouth daily with breakfast. Patient not taking: Reported on 07/23/2018 11/15/17   Roxan Hockey, MD  PRESCRIPTION MEDICATION Apply 1 application topically every Monday. selsum blue shampoo for itchy/flaky scalp    [provider]  senna-docusate (SENOKOT-S) 8.6-50 MG tablet Take 2 tablets by mouth at bedtime. Patient not taking: Reported on 07/23/2018 11/15/17 11/15/18  Roxan Hockey, MD  simethicone (MYLICON) 80 MG chewable tablet Chew 80 mg by mouth 3 (three) times daily as needed for flatulence (and bloating).    [provider]  sodium chloride (DEEP SEA NASAL SPRAY) 0.65 % nasal spray Place 1 spray into the nose 4 (four) times daily as needed for congestion.     [provider]  tetrahydrozoline 0.05 % ophthalmic solution Place 2 drops into both eyes 2 (two) times  daily.    [provider]  traZODone (DESYREL) 50 MG tablet Take 50 mg by mouth at bedtime. 06/25/17   [provider]     Family History  Problem Relation Age of Onset   Hypertension Mother    Hypertension Father     Social History   Socioeconomic History   Marital status: Legally Separated    Spouse name: Not on file   Number of children: Not on file   Years of education: Not on file   Highest education level: Not on file  Occupational History   Not on file  Social Needs   Financial resource strain: Not on file   Food insecurity:    Worry: Not on file    Inability: Not on file   Transportation needs:    Medical: Not on file    Non-medical: Not on file  Tobacco Use   Smoking status: Former Smoker    Types: Cigarettes   Smokeless tobacco: Never Used   Tobacco comment: quit many yrs ago  Substance and Sexual Activity   Alcohol use: No   Drug use: No   Sexual activity: Never  Lifestyle   Physical activity:    Days per week: Not on file    Minutes per session: Not on file   Stress: Not on file  Relationships   Social connections:    Talks on phone: Not on file    Gets together: Not on file    Attends religious service: Not on file    Active member of club or organization: Not on file    Attends meetings of clubs or organizations: Not on file    Relationship status: Not on file  Other Topics Concern   Not on file  Social History Narrative   Not on file     Review of Systems: A 12 point ROS discussed and pertinent positives are indicated in the HPI above.  All other systems are negative.  Review of Systems  Constitutional: Negative for fatigue and fever.  Respiratory: Negative for cough and shortness of breath.   Cardiovascular: Negative for chest pain.  Gastrointestinal: Positive for anal bleeding and blood in stool. Negative for abdominal pain, nausea and vomiting.  Genitourinary: Negative for dysuria, frequency and  urgency.  Musculoskeletal: Negative for back pain.  Psychiatric/Behavioral: Negative for behavioral problems and confusion.    Vital Signs: BP (!) 159/65 (BP Location: Left Arm)    Pulse 71    Temp 98.5 F (36.9 C) (Oral)    Resp 18  Ht 5' (1.524 m)    Wt 165 lb (74.8 kg)    SpO2 92%    BMI 32.22 kg/m   Physical Exam Vitals signs and nursing note reviewed.  Constitutional:      Appearance: Normal appearance.  HENT:     Mouth/Throat:     Mouth: Mucous membranes are moist.     Pharynx: Oropharynx is clear.  Cardiovascular:     Rate and Rhythm: Normal rate and regular rhythm.     Heart sounds: No murmur. No friction rub. No gallop.   Pulmonary:     Effort: Pulmonary effort is normal.     Breath sounds: Normal breath sounds.  Abdominal:     General: Abdomen is flat. There is no distension.     Palpations: Abdomen is soft.     Comments: Bright red blood from rectum  Skin:    General: Skin is warm and dry.  Neurological:     General: No focal deficit present.     Mental Status: She is alert and oriented to person, place, and time.  Psychiatric:        Mood and Affect: Mood normal.        Behavior: Behavior normal.        Thought Content: Thought content normal.        Judgment: Judgment normal.      MD Evaluation Airway: WNL Heart: WNL Abdomen: WNL Chest/ Lungs: WNL ASA  Classification: 3 Mallampati/Airway Score: One   Imaging: Ct Abdomen Pelvis Wo Contrast  Result Date: 07/23/2018 CLINICAL DATA:  Vaginal bleeding EXAM: CT ABDOMEN AND PELVIS WITHOUT CONTRAST TECHNIQUE: Multidetector CT imaging of the abdomen and pelvis was performed following the standard protocol without IV contrast. COMPARISON:  07/19/2017 FINDINGS: Lower chest:  Extensive atherosclerotic calcification Hepatobiliary: Hepatic steatosis.Cholelithiasis without evidence of inflammation or biliary obstruction. Pancreas: Unremarkable. Spleen: Unremarkable. Adrenals/Urinary Tract: Negative adrenals. No  hydronephrosis or ureteral stone. Right calcifications are at least predominantly vascular. Unremarkable bladder. Stomach/Bowel: Diffuse formed stool. No bowel obstruction. No evidence of bowel inflammation. Vascular/Lymphatic: Extensive atherosclerotic calcification. Focal calcification of a peripheral ileocolic vessels is chronic. No mass or adenopathy. There is retroperitoneal stranding at the level of the pelvis that is stable from prior. Suspect this is related patient's history of radiotherapy. Reproductive:Hysterectomy.  No visualized ovaries. Other: No ascites or pneumoperitoneum. Musculoskeletal: No acute abnormalities. Disc narrowing and facet arthropathy with L4-5 anterolisthesis. IMPRESSION: 1. No acute finding or change from 07/19/2017. 2. Hysterectomy. 3. Hepatic steatosis and cholelithiasis. Electronically Signed   By: Monte Fantasia M.D.   On: 07/23/2018 04:23   Nm Gi Blood Loss  Result Date: 07/25/2018 CLINICAL DATA:  Active rectal bleed/ eval for bleeding origin. EXAM: NUCLEAR MEDICINE GASTROINTESTINAL BLEEDING SCAN TECHNIQUE: Sequential abdominal images were obtained following intravenous administration of Tc-71m labeled red blood cells. RADIOPHARMACEUTICALS:  26.3 mCi Tc-77m pertechnetate in-vitro labeled red cells. COMPARISON:  CT of the abdomen and pelvis on 07/23/2018 FINDINGS: There is abnormal activity within the proximal to mid transverse colon, leading to intraluminal activity within the distal transverse colon, splenic flexure, and proximal descending colon. Expected blood pool activity elsewhere. IMPRESSION: Most likely site of bleeding is within the proximal to mid transverse colon. These results were called by telephone at the time of interpretation on 07/25/2018 at 1:32 pm to Dr. Watt Climes, who verbally acknowledged these results. The findings were discussed with Dr. Earleen Newport at 13:54. Electronically Signed   By: Nolon Nations M.D.   On: 07/25/2018 13:57   US  Pelvic Complete With  Transvaginal  Result Date: 07/23/2018 CLINICAL DATA:  Postmenopausal vaginal bleeding. Previous hysterectomy. EXAM: TRANSABDOMINAL AND TRANSVAGINAL ULTRASOUND OF PELVIS TECHNIQUE: Both transabdominal and transvaginal ultrasound examinations of the pelvis were performed. Transabdominal technique was performed for global imaging of the pelvis including uterus, ovaries, adnexal regions, and pelvic cul-de-sac. It was necessary to proceed with endovaginal exam following the transabdominal exam to visualize the ovaries. COMPARISON:  None FINDINGS: Uterus Uterus is surgically absent. Endometrium Surgically absent. Right ovary Not visualized. Left ovary Not visualized. Other findings No abnormal free fluid. Filled bladder is present without filling defect or wall thickening. IMPRESSION: Uterus is surgically absent. Ovaries are not visualized but no mass or abnormal fluid demonstrated in the visualized adnexal regions. Electronically Signed   By: Lucienne Capers M.D.   On: 07/23/2018 03:31    Labs:  CBC: Recent Labs    11/15/17 0307 07/23/18 0105  07/24/18 0026 07/24/18 1400 07/25/18 0321 07/25/18 0948  WBC 10.9* 10.1  --  10.8*  --  8.8  --   HGB 8.2* 7.9*   < > 8.9* 8.1* 9.0* 7.9*  HCT 26.6* 25.7*   < > 27.9* 26.1* 28.7* 25.6*  PLT 180 288  --  273  --  235  --    < > = values in this interval not displayed.    COAGS: Recent Labs    07/23/18 0105  INR 1.0    BMP: Recent Labs    11/15/17 0307 07/23/18 0105 07/24/18 0026 07/25/18 0321  NA 135 133* 137 137  K 4.5 4.6 5.5* 4.1  CL 93* 95* 97* 96*  CO2 30 24 27 28   GLUCOSE 292* 265* 213* 175*  BUN 42* 52* 51* 47*  CALCIUM 9.5 10.0 10.0 10.1  CREATININE 2.54* 2.89* 2.85* 2.80*  GFRNONAA 16* 14* 14* 15*  GFRAA 19* 16* 17* 17*    LIVER FUNCTION TESTS: Recent Labs    11/14/17 1205  BILITOT 0.5  AST 14*  ALT 12  ALKPHOS 35*  PROT 6.8  ALBUMIN 3.3*    TUMOR MARKERS: No results for input(s): AFPTM, CEA, CA199, CHROMGRNA in  the last 8760 hours.  Assessment and Plan: GI bleed Patient admitted with rectal bleeding.  NM scan positive for activity in possibly the transverse colon.  IR consulted for mesenteric angiogram with possible embolization by Dr. Watt Climes.  Case reviewed by Dr. Earleen Newport who approves patient for procedure.  Have discussed extensively with both the patient's son and daughter the risks of the procedure as well as possibility of negative angiogram. They are in agreement they would like to move forward.  Patient has had clear liquids today. Not on blood thinners.  INR 1.0  Risks and benefits of angiogram and possible intervention were discussed with the patient including, but not limited to bleeding, infection, vascular injury or contrast induced renal failure.  This interventional procedure involves the use of X-rays and because of the nature of the planned procedure, it is possible that we will have prolonged use of X-ray fluoroscopy.  Potential radiation risks to you include (but are not limited to) the following: - A slightly elevated risk for cancer  several years later in life. This risk is typically less than 0.5% percent. This risk is low in comparison to the normal incidence of human cancer, which is 33% for women and 50% for men according to the [place]. - Radiation induced injury can include skin redness, resembling a rash, tissue breakdown / ulcers and  hair loss (which can be temporary or permanent).   The likelihood of either of these occurring depends on the difficulty of the procedure and whether you are sensitive to radiation due to previous procedures, disease, or genetic conditions.   IF your procedure requires a prolonged use of radiation, you will be notified and given written instructions for further action.  It is your responsibility to monitor the irradiated area for the 2 weeks following the procedure and to notify your physician if you are concerned that you have  suffered a radiation induced injury.    All of the patient's questions were answered, patient is agreeable to proceed.  Consent signed and in chart.  Thank you for this interesting consult.  I greatly enjoyed meeting [name] and look forward to participating in their care.  A copy of this report was sent to the requesting provider on this date.  Electronically Signed: Docia Barrier, PA 07/25/2018, 2:59 PM   I spent a total of 40 Minutes    in face to face in clinical consultation, greater than 50% of which was counseling/coordinating care for GI bleed.

## 2018-07-25 NOTE — Progress Notes (Signed)
CRITICAL VALUE ALERT  Critical Value:  Hgb 6.8  Date & Time Notied:  07/25/18 2120  Provider Notified:  [place]  Orders Received/Actions taken: Paged

## 2018-07-25 NOTE — Procedures (Signed)
Interventional Radiology Procedure Note  Procedure: US guided right CFA access, with mesenteric angio to include celiac, SMA, and IMA.   Closure of puncture with Exoseal.   Findings: Celiac, SMA, and IMA are patent.  Athero of all vasculature, including the branches of the superior hemorrhoidal artery.  No tumor blush, extravasation, contrast pooling.   No finding to justify empiric or targeted embolization of lower GI bleeding.   Complications: None  Recommendations:  - Right leg straight x 4 hours - continue to observe H&H - Gentle fluid hydration given contrast dose of 48cc - Do not submerge for 7 days - Routine wound care   Signed,  Dulcy Fanny. Earleen Newport, DO

## 2018-07-25 NOTE — Progress Notes (Signed)
Patient in nuclear med for ongoing bleeding and if positive for a GI source please call me if upper track positivity otherwise if in large bowel call IR for possible angiogram and coils overall her hemoglobin is stable from 2:00 yesterday although I am not aware if she got any blood in between

## 2018-07-25 NOTE — Progress Notes (Signed)
PROGRESS NOTE    [name]  GLO:[id number] DOB: [birth year] DOA: 07/23/2018 PCP: Josetta Huddle, MD  Brief Narrative:  HPI per Dr. Mitzi Hansen on 07/23/2018 [name] is a 85 y.o. female with medical history significant for uterine cancer status post hysterectomy and radiation, chronic diastolic CHF, insulin-dependent diabetes mellitus, chronic kidney disease stage IV, depression with anxiety, and chronic microcytic anemia, now presenting to the emergency department for evaluation of bleeding that began yesterday afternoon.  Patient reports a history of frequent dark red and maroon blood in her bowel movements which she had been told was due to hemorrhoids, and reports that she had acute increase in bleeding yesterday afternoon, describing dark and maroon blood "pouring out."  She thought that the blood was coming from her rectum, but was noted to have blood and clots at the introitus in the ED, was asked whether the bleeding she has been having was coming from the vagina or rectum, and she was not sure.  She denies any abdominal pain.  She denies any vomiting.  No recent fevers, chills, chest pain, or cough  **Interim History Presented to the emergency department nursing facility with rectal bleeding.  GI was consulted, but after discussions with family will try and avoid colonoscopy. Was stable from a bleeding standpoint until this AM and so NM Bleeding Scan was ordered. Hb/Hct is dropping and repeat Hb/Hct this AM was 7.9/25.6.  Assessment & Plan:   Principal Problem:   Acute GI bleeding Active Problems:   Macrocytic anemia   Type 2 diabetes mellitus with stage 4 chronic kidney disease (HCC)   GI bleed   CKD (chronic kidney disease), stage IV (HCC)   Depression with anxiety  Acute GI Bleeding with BRBPR Macrocytic Anemia -Presented with reported frequent dark red and maroon blood in her bowel movements that she had been told was from hemorrhoids,  -Reports acute increase in  bleeding since the afternoon of 3/10, determined to likely be coming from rectum.  -In ED with no fistula or abnormal fluid on pelvic US and no acute findings on CT abd/pelvis.   -Gastroenterology consulted, but agreed with family against colonoscopy at this time.   -Hgb dropped 7.9 down to 6.8 on repeat check and patient was transfused 2 units of packed red blood cells with repeat check 8.8 g/dL posttransfusion, but nursing staff reporting bleeding the night before Last.   -Now having Active GIB and Hb/Hct had improved to 9.0/28.7 and dropped down to 7.9/25.6 -Will be Obtaining NM GI Bleeding Scan and this is pending -Appreciate GI consultative services and per Dr. Watt Climes positive for GI source will need to call Dr. Watt Climes if upper tract positivity otherwise if in large bowel will need to call IR for Possible Angiogram and Coil -C/w Ferrous Sulfate 325 mg po Daily -Continue monitor H&H's and will repeat at 3 PM today and if necessary will transfuse -C/w IV Pantoprazole 40 mg q12h -C/w Hydrocortisone 25 mg po BID -Transfuse as necessary to a goal Hb of >1.8  Chronic Diastolic Congestive Heart Failure -Patient appears to be euvolemic. -Strict I&Os and Daily Weights; Weights aren't done -Patient is +198 mL since admission -Continue Torsemide40 mg po Daily -Continue to Monitor Volume Status Carefully   CKD IV -SCr is 2.89 on admission, slightly increased from prior and repeat BUN/Cr went from 51/2.85 -> 47/2.80 -Continue to Renally-dose medications and avoid Nephrotoxic Medications if Possible -Avoid Hypotension and Contrast Dyes -Continue to Monitor and Trend Renal Fxn  -Repeat CMP  in AM   Insulin-Dependent DM -Last A1c was 6.5% one yr ago. -Check CBG's and use a SSI with Novolog for now -CBG's ranging from 143-213  Depression and Anxeity -Stable. -Continue Depakote 125 mg po Daily and 250 mg po Daily qHS and Trazodone and 50 mg po qHS  Hyperkalemia: -Acute.  Potassium  noted to be elevated at 5.5.  -Gentle IV fluids of normal saline at 50 mL/h -Recheck potassium in a.m.  Obesity -Estimated body mass index is 32.22 kg/m as calculated from the following:   Height as of this encounter: 5' (1.524 m).   Weight as of this encounter: 74.8 kg. -Weight Loss and Dietary Counseling given  Gout -C/w Allopurinol 150 mg po qHS  Hx of Endometrial Cancer -S/p Hysterectomy -Has had Radiation Therapy with External Beam and tehn Brachytherapy  Chronic Respiratory Failure with Hypoxia -C/w DuoNeb 3 mL BID and 3 mL q6hprn Wheezing and SOB  Obesity -Estimated body mass index is 32.22 kg/m as calculated from the following:   Height as of this encounter: 5' (1.524 m).   Weight as of this encounter: 74.8 kg. -Weight Loss and Dietary Counseling given   DVT prophylaxis: SCDs Code Status: DO NOT RESUSCITATE Family Communication: No family present at beside  Disposition Plan: Pending cessastion of GI Bleeding  Consultants:   [facility]   Procedures:  NM Bleeding Scan   Antimicrobials:  Anti-infectives (From admission, onward)   None     Subjective: Seen and examined at bedside prior to her going up for nuclear medicine scan and she states that she did not know that she was bleeding.  No nausea or vomiting. Denies any any chest pain, shortness of breath.  No lightheadedness or dizziness.  No other concerns or complaints at this time.  Objective: Vitals:   07/24/18 2128 07/24/18 2322 07/25/18 0753 07/25/18 0811  BP: (!) 179/60 108/76 (!) 159/65   Pulse: 82 75 71   Resp: 18 16 18    Temp: 97.8 F (36.6 C) 97.9 F (36.6 C) 98.5 F (36.9 C)   TempSrc: Oral  Oral   SpO2: 98% 93% 91% 92%  Weight:      Height:        Intake/Output Summary (Last 24 hours) at 07/25/2018 1054 Last data filed at 07/24/2018 1703 Gross per 24 hour  Intake 445 ml  Output -  Net 445 ml   Filed Weights   07/23/18 0045  Weight: 74.8 kg   Examination:  Physical Exam:  Constitutional: WN/WD obese elderly Caucasian female in NAD and appears calm and comfortable Eyes: Lids and conjunctivae normal, sclerae anicteric  ENMT: External Ears, Nose appear normal. Slightly hard of hearing.   Neck: Appears normal, supple, no cervical masses, normal ROM, no appreciable thyromegaly; no JVD Respiratory: Diminished to auscultation bilaterally, no wheezing, rales, rhonchi or crackles. Normal respiratory effort and patient is not tachypenic. No accessory muscle use.  Cardiovascular: RRR, no murmurs / rubs / gallops. S1 and S2 auscultated. Trace extremity edema.  Abdomen: Soft, non-tender to palpate, Distended. No masses palpated. No appreciable hepatosplenomegaly. Bowel sounds positive x4.  GU: Deferred. Musculoskeletal: No clubbing / cyanosis of digits/nails. No joint deformity upper and lower extremities.  Skin: No rashes, lesions, ulcers on a limited skin evaluation. No induration; Warm and dry.  Neurologic: CN 2-12 grossly intact with no focal deficits.  Romberg sign and cerebellar reflexes not assessed.  Psychiatric: Normal judgment and insight. Alert and awake. Normal mood and appropriate affect.   Data Reviewed:  I have personally reviewed following labs and imaging studies  CBC: Recent Labs  Lab 07/23/18 0105  07/23/18 1753 07/24/18 0026 07/24/18 1400 07/25/18 0321 07/25/18 0948  WBC 10.1  --   --  10.8*  --  8.8  --   NEUTROABS 6.1  --   --   --   --   --   --   HGB 7.9*   < > 8.8* 8.9* 8.1* 9.0* 7.9*  HCT 25.7*   < > 27.5* 27.9* 26.1* 28.7* 25.6*  MCV 105.8*  --   --  99.3  --  100.3*  --   PLT 288  --   --  273  --  235  --    < > = values in this interval not displayed.   Basic Metabolic Panel: Recent Labs  Lab 07/23/18 0105 07/24/18 0026 07/25/18 0321  NA 133* 137 137  K 4.6 5.5* 4.1  CL 95* 97* 96*  CO2 24 27 28   GLUCOSE 265* 213* 175*  BUN 52* 51* 47*  CREATININE 2.89* 2.85* 2.80*  CALCIUM 10.0 10.0 10.1   GFR:  Estimated Creatinine Clearance: 12.8 mL/min (A) (by C-G formula based on SCr of 2.8 mg/dL (H)). Liver Function Tests: No results for input(s): AST, ALT, ALKPHOS, BILITOT, PROT, ALBUMIN in the last 168 hours. No results for input(s): LIPASE, AMYLASE in the last 168 hours. No results for input(s): AMMONIA in the last 168 hours. Coagulation Profile: Recent Labs  Lab 07/23/18 0105  INR 1.0   Cardiac Enzymes: No results for input(s): CKTOTAL, CKMB, CKMBINDEX, TROPONINI in the last 168 hours. BNP (last 3 results) No results for input(s): PROBNP in the last 8760 hours. HbA1C: No results for input(s): HGBA1C in the last 72 hours. CBG: Recent Labs  Lab 07/24/18 1051 07/24/18 1539 07/24/18 2331 07/25/18 0445 07/25/18 0754  GLUCAP 181* 196* 179* 152* 143*   Lipid Profile: No results for input(s): CHOL, HDL, LDLCALC, TRIG, CHOLHDL, LDLDIRECT in the last 72 hours. Thyroid Function Tests: No results for input(s): TSH, T4TOTAL, FREET4, T3FREE, THYROIDAB in the last 72 hours. Anemia Panel: No results for input(s): VITAMINB12, FOLATE, FERRITIN, TIBC, IRON, RETICCTPCT in the last 72 hours. Sepsis Labs: No results for input(s): PROCALCITON, LATICACIDVEN in the last 168 hours.  Recent Results (from the past 240 hour(s))  MRSA PCR Screening     Status: None   Collection Time: 07/24/18  4:29 AM  Result Value Ref Range Status   MRSA by PCR NEGATIVE NEGATIVE Final    Comment:        The GeneXpert MRSA Assay (FDA approved for NASAL specimens only), is one component of a comprehensive MRSA colonization surveillance program. It is not intended to diagnose MRSA infection nor to guide or monitor treatment for MRSA infections. Performed at [facility], [place] [address] [place], [place] [address]     Radiology Studies: No results found.  Scheduled Meds: . allopurinol  150 mg Oral QHS  . divalproex  125 mg Oral Daily  . divalproex  250 mg Oral QHS  . ferrous sulfate  325 mg Oral Q  breakfast  . gabapentin  400 mg Oral QHS  . hydrocortisone  25 mg Rectal BID  . insulin aspart  0-9 Units Subcutaneous Q4H  . ipratropium-albuterol  3 mL Nebulization BID  . pantoprazole (PROTONIX) IV  40 mg Intravenous Q12H  . rOPINIRole  1 mg Oral BID  . torsemide  40 mg Oral Daily  . traZODone  50 mg Oral QHS   Continuous Infusions:   LOS: 1 day   Kerney Elbe, DO [facility] PAGER is on [place]  If 7PM-7AM, please contact night-coverage [email or web address] Password [facility] 07/25/2018, 10:54 AM

## 2018-07-25 NOTE — Clinical Social Work Note (Signed)
Readmission risk assessment completed. No addition resources needed at this time.   [place], [place]

## 2018-07-25 NOTE — Progress Notes (Signed)
OT Cancellation Note  Patient Details Name: [name] MRN: [id number] DOB: 1928-10-26   Cancelled Treatment:    Reason Eval/Treat Not Completed: Patient at procedure or test/ unavailable Patient in nuclear med for ongoing bleeding. OT will return when appropriate to evaluate pt.   Dorinda Hill OTR/L Acute Rehabilitation Services Office: [place] 07/25/2018, 11:08 AM

## 2018-07-25 NOTE — Progress Notes (Signed)
Occupational Therapy Evaluation Patient Details Name: [name] MRN: [id number] DOB: 1929/03/07 Today's Date: 07/25/2018    History of Present Illness Patient is 85 y/o female presenting to hospital with possible acute GI bleed. CT of abdomen negative for any acute finding. PMH includes DM, uterine cancer s/p hysterectomy, gout, anemia, depression, HTN, and CVA with L sided deficits.     Clinical Impression   PTA, pt was living at [facility], and reports she requires assistance with all ADL/IADL and used a w/c for functional mobility requiring assistance for transfers. Pt currently requires modA for bed mobility to prepare for ADL. Pt has notable tremor in RUE, she reports is due to Parkinson's, impacting her ability to use her RUE. Pt reports she is functioning at baseline. Due to functional limitations listed below (see OT problem list) pt would benefit from acute OT to address established goals to facilitate safe D/C to venue listed below. At this time, recommend SNF follow-up. Will continue to follow acutely.     Follow Up Recommendations  SNF    Equipment Recommendations       Recommendations for Other Services       Precautions / Restrictions Precautions Precautions: Fall Restrictions Weight Bearing Restrictions: No      Mobility Bed Mobility Overal bed mobility: Needs Assistance Bed Mobility: Supine to Sit;Sit to Supine     Supine to sit: Mod assist Sit to supine: Mod assist   General bed mobility comments: Patient required minA for trunk control and modA for LE management for all bed mobility. Patient able to maintain sitting balance briefly without UE but required minA or 1UE assist. Verbal cues for upright posture to prevent posterior lean. Used bed pad to scoot hips towards EOB  Transfers                      Balance Overall balance assessment: Needs assistance Sitting-balance support: Single extremity supported;Feet supported Sitting balance-Leahy  Scale: Poor Sitting balance - Comments: Patient required UE support to maintain sitting balance Postural control: Posterior lean                                 ADL either performed or assessed with clinical judgement   ADL Overall ADL's : At baseline                                       General ADL Comments: Pt reports she is at baseline, pt requires modA for UE ADL, and max-totalA  for LB ADL;     Vision         Perception     Praxis      Pertinent Vitals/Pain Pain Assessment: No/denies pain     Hand Dominance Right   Extremity/Trunk Assessment Upper Extremity Assessment Upper Extremity Assessment: LUE deficits/detail LUE Deficits / Details: LUE deficits secondary to CVA   Lower Extremity Assessment Lower Extremity Assessment: LLE deficits/detail LLE Deficits / Details: LLE deficits secondary to CVA   Cervical / Trunk Assessment Cervical / Trunk Assessment: Normal   Communication Communication Communication: No difficulties   Cognition Arousal/Alertness: Awake/alert Behavior During Therapy: WFL for tasks assessed/performed Overall Cognitive Status: No family/caregiver present to determine baseline cognitive functioning  General Comments: Patient very pleasant. A&Ox4   General Comments  no family or caregiver present during session    Exercises     Shoulder Instructions      Home Living Family/patient expects to be discharged to:: Skilled nursing facility                                        Prior Functioning/Environment Level of Independence: Needs assistance  Gait / Transfers Assistance Needed: Reports needing assistance for transfers. States she has been practicing walking with RW. Uses WC for primary mobility.  ADL's / Homemaking Assistance Needed: Requires assistance for all ADLs   Comments: working with PT to regain ambulation         OT Problem  List: Decreased strength;Decreased range of motion;Decreased activity tolerance;Impaired balance (sitting and/or standing);Decreased safety awareness;Decreased knowledge of use of DME or AE;Impaired UE functional use      OT Treatment/Interventions: Self-care/ADL training;Therapeutic exercise;Neuromuscular education;DME and/or AE instruction;Therapeutic activities;Patient/family education;Balance training    OT Goals(Current goals can be found in the care plan section) Acute Rehab OT Goals Patient Stated Goal: "sit up in bed" OT Goal Formulation: With patient Time For Goal Achievement: 08/08/18 Potential to Achieve Goals: Good ADL Goals Pt Will Perform Grooming: with modified independence;sitting Pt Will Perform Upper Body Dressing: with min assist Pt Will Transfer to Toilet: with mod assist;stand pivot transfer  OT Frequency: Min 2X/week   Barriers to D/C:            Co-evaluation              AM-PAC OT "6 Clicks" Daily Activity     Outcome Measure Help from another person eating meals?: None Help from another person taking care of personal grooming?: A Little Help from another person toileting, which includes using toliet, bedpan, or urinal?: Total Help from another person bathing (including washing, rinsing, drying)?: A Lot Help from another person to put on and taking off regular upper body clothing?: A Lot Help from another person to put on and taking off regular lower body clothing?: Total 6 Click Score: 13   End of Session Nurse Communication: Mobility status  Activity Tolerance: Patient tolerated treatment well Patient left: in bed;with call bell/phone within reach;Other (comment)(with staff to take pt to procedure)  OT Visit Diagnosis: Unsteadiness on feet (R26.81);Other abnormalities of gait and mobility (R26.89);Muscle weakness (generalized) (M62.81)                Time: 1537-1600 OT Time Calculation (min): 23 min Charges:  OT General Charges $OT Visit: 1  Visit OT Evaluation $OT Eval Moderate Complexity: 1 Mod OT Treatments $Self Care/Home Management : 8-22 mins  Dorinda Hill OTR/L Acute Rehabilitation Services Office: [place] 07/25/2018, 5:12 PM

## 2018-07-25 NOTE — NC FL2 (Signed)
[place] LEVEL OF CARE SCREENING TOOL     IDENTIFICATION  Patient Name: [name] Birthdate: March 05, 1929 Sex: female Admission Date (Current Location): 07/23/2018  [facility] and [place] Number:  Herbalist and Address:  [facility], [place] [address], [place], [place] [address]      Provider Number: [id number]  Attending Physician Name and Address:  Kerney Elbe, DO  Relative Name and Phone Number:       Current Level of Care: Hospital Recommended Level of Care: [place] Prior Approval Number:    Date Approved/Denied:   PASRR Number: [id number]  Discharge Plan: SNF    Current Diagnoses: Patient Active Problem List   Diagnosis Date Noted  . Acute GI bleeding 07/23/2018  . Depression with anxiety 07/23/2018  . CKD (chronic kidney disease), stage IV ([place]) 07/23/2017  . Edema 07/20/2017  . Shortness of breath 07/20/2017  . Gastroesophageal reflux disease   . Hyperlipidemia   . GI bleed 07/29/2013  . Anemia associated with acute blood loss 07/29/2013  . Diarrhea 07/21/2013  . Dehydration with hyponatremia 07/20/2013  . Macrocytic anemia 07/20/2013  . Type 2 diabetes mellitus with stage 4 chronic kidney disease ([place]) 07/20/2013  . Stroke ([place])   . Hypertension   . Hx of radiation therapy   . Cancer ([place])   . Malignant neoplasm of corpus uteri, except isthmus ([place]) 06/11/2011    Orientation RESPIRATION BLADDER Height & Weight     Self, Time, Situation, Place  Normal Continent Weight: 165 lb (74.8 kg) Height:  5' (152.4 cm)  BEHAVIORAL SYMPTOMS/MOOD NEUROLOGICAL BOWEL NUTRITION STATUS      Continent Diet(clear liquid diet, thin liquids, see d/c summary diet subject to change)  AMBULATORY STATUS COMMUNICATION OF NEEDS Skin   Extensive Assist Verbally Normal                       Personal Care Assistance Level of Assistance  Dressing, Feeding, Bathing Bathing Assistance: Maximum  assistance Feeding assistance: Independent Dressing Assistance: Maximum assistance     Functional Limitations Info  Sight, Hearing, Speech Sight Info: Adequate Hearing Info: Impaired Speech Info: Adequate    SPECIAL CARE FACTORS FREQUENCY  PT (By licensed PT), OT (By licensed OT)     PT Frequency: 5x OT Frequency: 5x            Contractures Contractures Info: Not present    Additional Factors Info  Code Status, Allergies Code Status Info: DNR Allergies Info: Penicillins           Current Medications (07/25/2018):  This is the current hospital active medication list Current Facility-Administered Medications  Medication Dose Route Frequency Provider Last Rate Last Dose  . acetaminophen (TYLENOL) tablet 650 mg  650 mg Oral Q6H PRN Opyd, Ilene Qua, MD       Or  . acetaminophen (TYLENOL) suppository 650 mg  650 mg Rectal Q6H PRN Opyd, Ilene Qua, MD      . allopurinol (ZYLOPRIM) tablet 150 mg  150 mg Oral QHS Kc, Maren [place], MD   150 mg at 07/24/18 2238  . divalproex (DEPAKOTE SPRINKLE) capsule 125 mg  125 mg Oral Daily Kc, Ramesh, MD   125 mg at 07/24/18 1110  . divalproex (DEPAKOTE SPRINKLE) capsule 250 mg  250 mg Oral QHS Kc, Ramesh, MD   250 mg at 07/24/18 2331  . ferrous sulfate tablet 325 mg  325 mg Oral Q breakfast Tamala Julian, Rondell  A, MD      . gabapentin (NEURONTIN) capsule 400 mg  400 mg Oral QHS Kc, Ramesh, MD   400 mg at 07/24/18 2238  . HYDROcodone-acetaminophen (NORCO/VICODIN) 5-325 MG per tablet 1 tablet  1 tablet Oral Q4H PRN Opyd, Ilene Qua, MD      . hydrocortisone (ANUSOL-HC) suppository 25 mg  25 mg Rectal BID Smith, Rondell A, MD      . insulin aspart (novoLOG) injection 0-9 Units  0-9 Units Subcutaneous Q4H Opyd, Ilene Qua, MD   2 Units at 07/25/18 0451  . ipratropium-albuterol (DUONEB) 0.5-2.5 (3) MG/3ML nebulizer solution 3 mL  3 mL Nebulization Q6H PRN Opyd, Ilene Qua, MD      . ipratropium-albuterol (DUONEB) 0.5-2.5 (3) MG/3ML nebulizer solution 3 mL  3 mL  Nebulization BID Fuller Plan A, MD   3 mL at 07/25/18 0810  . ondansetron (ZOFRAN) tablet 4 mg  4 mg Oral Q6H PRN Opyd, Ilene Qua, MD       Or  . ondansetron (ZOFRAN) injection 4 mg  4 mg Intravenous Q6H PRN Opyd, Ilene Qua, MD      . pantoprazole (PROTONIX) injection 40 mg  40 mg Intravenous Q12H Opyd, Ilene Qua, MD   40 mg at 07/24/18 2237  . rOPINIRole (REQUIP) tablet 1 mg  1 mg Oral BID Kc, Ramesh, MD   1 mg at 07/24/18 2238  . torsemide (DEMADEX) tablet 40 mg  40 mg Oral Daily Smith, Rondell A, MD      . traMADol (ULTRAM) tablet 25 mg  25 mg Oral Q12H PRN Kc, Ramesh, MD      . traZODone (DESYREL) tablet 50 mg  50 mg Oral QHS Antonieta Pert, MD   50 mg at 07/24/18 2238     Discharge Medications: Please see discharge summary for a list of discharge medications.  Relevant Imaging Results:  Relevant Lab Results:   Additional Information SSN: [id number]  Eileen Stanford, LCSW

## 2018-07-25 NOTE — Progress Notes (Signed)
Radiology called me with positive bleeding scan and probably the mid transverse and will ask IR to see ASAP and have asked radiology to notify IR as well

## 2018-07-25 NOTE — TOC Initial Note (Signed)
Transition of Care [facility]) - Initial/Assessment Note    Patient Details  Name: [name] MRN: [id number] Date of Birth: 05-10-1929  Transition of Care [facility]) CM/SW Contact:    Eileen Stanford, LCSW Phone Number: 07/25/2018, 9:28 AM  Clinical Narrative:        CSW met with pt at bedside. No family or friends present. Pt is alert and oriented x4. Pt resides at [facility]. Pt is agreeable to go to SNF. Pt would prefer to stay at [facility]. Pt verbalized agreement for CSW to contact daughter to discuss plan. CSW will follow up with daughter and [facility].           Expected Discharge Plan: Skilled Nursing Facility Barriers to Discharge: Insurance Authorization, Continued Medical Work up   Patient Goals and CMS Choice Patient states their goals for this hospitalization and ongoing recovery are:: To return to ALF CMS Medicare.gov Compare Post Acute Care list provided to:: Patient Choice offered to / list presented to : Patient  Expected Discharge Plan and Services Expected Discharge Plan: [place] Choice: [place] Living arrangements for the past 2 months: [place] Expected Discharge Date: 07/25/18                        Prior Living Arrangements/Services Living arrangements for the past 2 months: [place] Lives with:: Self Patient language and need for interpreter reviewed:: No Do you feel safe going back to the place where you live?: Yes      Need for Family Participation in Patient Care: No (Comment) Care giver support system in place?: Yes (comment)   Criminal Activity/Legal Involvement Pertinent to Current Situation/Hospitalization: No - Comment as needed  Activities of Daily Living Home Assistive Devices/Equipment: Wheelchair ADL Screening (condition at time of admission) Patient's cognitive ability adequate to safely complete daily activities?: No Is the patient deaf or have  difficulty hearing?: Yes Does the patient have difficulty seeing, even when wearing glasses/contacts?: No Does the patient have difficulty concentrating, remembering, or making decisions?: No Patient able to express need for assistance with ADLs?: Yes Does the patient have difficulty dressing or bathing?: Yes Independently performs ADLs?: No Communication: Independent Dressing (OT): Dependent Is this a change from baseline?: Pre-admission baseline Grooming: Dependent Is this a change from baseline?: Pre-admission baseline Feeding: Needs assistance Bathing: Dependent Is this a change from baseline?: Pre-admission baseline Toileting: Dependent Is this a change from baseline?: Pre-admission baseline In/Out Bed: Dependent Is this a change from baseline?: Pre-admission baseline Walks in Home: Dependent Is this a change from baseline?: Pre-admission baseline Does the patient have difficulty walking or climbing stairs?: Yes Weakness of Legs: Both Weakness of Arms/Hands: Both  Permission Sought/Granted Permission sought to share information with : Family Supports, Chartered certified accountant granted to share information with : Yes, Verbal Permission Granted  Share Information with NAME: [name]  Permission granted to share info w AGENCY: [facility]  Permission granted to share info w Relationship: daughter  Permission granted to share info w Contact Information: [phone]  Emotional Assessment Appearance:: Appears stated age Attitude/Demeanor/Rapport: (pt was appropriate) Affect (typically observed): Accepting, Appropriate, Calm Orientation: : Oriented to Self, Oriented to Place, Oriented to  Time, Oriented to Situation Alcohol / Substance Use: Not Applicable Psych Involvement: No (comment)  Admission diagnosis:  Rectal bleeding [K62.5] Post-menopause bleeding [N95.0] Patient Active Problem List   Diagnosis Date Noted  .  Acute GI bleeding 07/23/2018  . Depression  with anxiety 07/23/2018  . CKD (chronic kidney disease), stage IV ([place]) 07/23/2017  . Edema 07/20/2017  . Shortness of breath 07/20/2017  . Gastroesophageal reflux disease   . Hyperlipidemia   . GI bleed 07/29/2013  . Anemia associated with acute blood loss 07/29/2013  . Diarrhea 07/21/2013  . Dehydration with hyponatremia 07/20/2013  . Macrocytic anemia 07/20/2013  . Type 2 diabetes mellitus with stage 4 chronic kidney disease ([place]) 07/20/2013  . Stroke ([place])   . Hypertension   . Hx of radiation therapy   . Cancer ([place])   . Malignant neoplasm of corpus uteri, except isthmus ([place]) 06/11/2011   PCP:  Josetta Huddle, MD Pharmacy:   [facility] [address] [place] [address] [place] [address] Phone: [phone] Fax: [phone]     Social Determinants of Health (SDOH) Interventions    Readmission Risk Interventions 30 Day Unplanned Readmission Risk Score     ED to Hosp-Admission (Current) from 07/23/2018 in [place] [facility] [place] [facility]  30 Day Unplanned Readmission Risk Score (%)  28 Filed at 07/25/2018 0801     This score is the patient's risk of an unplanned readmission within 30 days of being discharged (0 -100%). The score is based on dignosis, age, lab data, medications, orders, and past utilization.   Low:  0-14.9   Medium: 15-21.9   High: 22-29.9   Extreme: 30 and above       Readmission Risk Prevention Plan 07/25/2018  Palliative Care Screening Not Complete  Some recent data might be hidden

## 2018-07-25 NOTE — Clinical Social Work Note (Signed)
CSW confirmed with [place] that pt is from SNF, long term care. Pt will return at d/c. Pt will not need UHC auth prior to admission.   [place], [place]

## 2018-07-26 DIAGNOSIS — N189 Chronic kidney disease, unspecified: Secondary | ICD-10-CM

## 2018-07-26 DIAGNOSIS — N179 Acute kidney failure, unspecified: Secondary | ICD-10-CM

## 2018-07-26 LAB — CBC WITH DIFFERENTIAL/PLATELET
ABS IMMATURE GRANULOCYTES: 0.05 10*3/uL (ref 0.00–0.07)
BASOS PCT: 0 %
Basophils Absolute: 0 10*3/uL (ref 0.0–0.1)
Eosinophils Absolute: 0.2 10*3/uL (ref 0.0–0.5)
Eosinophils Relative: 3 %
HCT: 24.4 % — ABNORMAL LOW (ref 36.0–46.0)
Hemoglobin: 8.1 g/dL — ABNORMAL LOW (ref 12.0–15.0)
Immature Granulocytes: 1 %
Lymphocytes Relative: 25 %
Lymphs Abs: 1.8 10*3/uL (ref 0.7–4.0)
MCH: 31.4 pg (ref 26.0–34.0)
MCHC: 33.2 g/dL (ref 30.0–36.0)
MCV: 94.6 fL (ref 80.0–100.0)
Monocytes Absolute: 0.6 10*3/uL (ref 0.1–1.0)
Monocytes Relative: 9 %
Neutro Abs: 4.6 10*3/uL (ref 1.7–7.7)
Neutrophils Relative %: 62 %
PLATELETS: 206 10*3/uL (ref 150–400)
RBC: 2.58 MIL/uL — ABNORMAL LOW (ref 3.87–5.11)
RDW: 19.3 % — ABNORMAL HIGH (ref 11.5–15.5)
WBC: 7.3 10*3/uL (ref 4.0–10.5)
nRBC: 0.3 % — ABNORMAL HIGH (ref 0.0–0.2)

## 2018-07-26 LAB — COMPREHENSIVE METABOLIC PANEL
ALT: 21 U/L (ref 0–44)
AST: 17 U/L (ref 15–41)
Albumin: 2.6 g/dL — ABNORMAL LOW (ref 3.5–5.0)
Alkaline Phosphatase: 47 U/L (ref 38–126)
Anion gap: 9 (ref 5–15)
BUN: 50 mg/dL — ABNORMAL HIGH (ref 8–23)
CO2: 26 mmol/L (ref 22–32)
Calcium: 8.9 mg/dL (ref 8.9–10.3)
Chloride: 100 mmol/L (ref 98–111)
Creatinine, Ser: 3.31 mg/dL — ABNORMAL HIGH (ref 0.44–1.00)
GFR calc Af Amer: 14 mL/min — ABNORMAL LOW (ref >60)
GFR calc non Af Amer: 12 mL/min — ABNORMAL LOW (ref >60)
Glucose, Bld: 169 mg/dL — ABNORMAL HIGH (ref 70–99)
Potassium: 4 mmol/L (ref 3.5–5.1)
Sodium: 135 mmol/L (ref 135–145)
Total Bilirubin: 1.9 mg/dL — ABNORMAL HIGH (ref 0.3–1.2)
Total Protein: 5.4 g/dL — ABNORMAL LOW (ref 6.5–8.1)

## 2018-07-26 LAB — HEMOGLOBIN AND HEMATOCRIT, BLOOD
HCT: 26.5 % — ABNORMAL LOW (ref 36.0–46.0)
HCT: 26.6 % — ABNORMAL LOW (ref 36.0–46.0)
HCT: 25.5 % — ABNORMAL LOW (ref 36.0–46.0)
Hemoglobin: 8.5 g/dL — ABNORMAL LOW (ref 12.0–15.0)
Hemoglobin: 8.6 g/dL — ABNORMAL LOW (ref 12.0–15.0)
Hemoglobin: 8.1 g/dL — ABNORMAL LOW (ref 12.0–15.0)

## 2018-07-26 LAB — GLUCOSE, CAPILLARY
GLUCOSE-CAPILLARY: 151 mg/dL — AB (ref 70–99)
GLUCOSE-CAPILLARY: 158 mg/dL — AB (ref 70–99)
Glucose-Capillary: 134 mg/dL — ABNORMAL HIGH (ref 70–99)
Glucose-Capillary: 192 mg/dL — ABNORMAL HIGH (ref 70–99)
Glucose-Capillary: 220 mg/dL — ABNORMAL HIGH (ref 70–99)
Glucose-Capillary: 184 mg/dL — ABNORMAL HIGH (ref 70–99)

## 2018-07-26 LAB — MAGNESIUM: Magnesium: 1.8 mg/dL (ref 1.7–2.4)

## 2018-07-26 LAB — PHOSPHORUS: Phosphorus: 4.4 mg/dL (ref 2.5–4.6)

## 2018-07-26 MED ORDER — SODIUM CHLORIDE 0.9 % IV SOLN
INTRAVENOUS | Status: AC
  Administered 2018-07-26 – 2018-07-27 (×2): via INTRAVENOUS

## 2018-07-26 NOTE — Progress Notes (Signed)
Referring Physician(s): Clarene Essex  Supervising Physician: Sandi Mariscal  Patient Status:  [facility] - In-pt  Chief Complaint: None  Subjective:  Lower GI bleed s/p attempted embolization (no findings to justify empiric or targeted embolization) 07/25/2018 by Dr. Earleen Newport. Patient awake and alert laying in bed with no complaints at this time. Accompanied by daughter at bedside. Right groin incision c/d/i.   Allergies: Penicillins  Medications: Prior to Admission medications   Medication Sig Start Date End Date Taking? Authorizing Provider  acetaminophen (TYLENOL) 325 MG tablet Take 650 mg by mouth See admin instructions. Take 650 mg by mouth three times a day and 650 mg every 8 hours as needed for pain   Yes [provider]  albuterol (PROVENTIL) (2.5 MG/3ML) 0.083% nebulizer solution Take 2.5 mg by nebulization every 2 (two) hours as needed for wheezing.   Yes [provider]  allopurinol (ZYLOPRIM) 300 MG tablet Take 150 mg by mouth at bedtime.   Yes [provider]  bisoprolol (ZEBETA) 10 MG tablet Take 1 tablet (10 mg total) by mouth daily. 07/23/13  Yes Josetta Huddle, MD  Carboxymethylcellulose Sod PF 0.5 % SOLN Place 1 drop into both eyes 2 (two) times daily.   Yes [provider]  Cholecalciferol (VITAMIN D3) 50 MCG (2000 UT) TABS Take 8,000 Units by mouth daily.   Yes [provider]  divalproex (DEPAKOTE SPRINKLE) 125 MG capsule 250 mg every morning and 125 mg in the afternoon Patient taking differently: Take 125-250 mg by mouth See admin instructions. Take 125 mg by mouth at noontime and 250 mg at bedtime 11/15/17  Yes Emokpae, Courage, MD  divalproex (DEPAKOTE) 125 MG DR tablet Take 250 mg by mouth daily after breakfast.   Yes [provider]  ferrous sulfate 325 (65 FE) MG tablet Take 325 mg by mouth daily with breakfast.   Yes [provider]  FLUoxetine (PROZAC) 20 MG tablet Take 30 mg by mouth daily.   Yes  [provider]  gabapentin (NEURONTIN) 100 MG capsule Take 200 mg by mouth 2 (two) times daily.   Yes [provider]  gabapentin (NEURONTIN) 400 MG capsule Take 400 mg by mouth at bedtime.   Yes [provider]  hydrALAZINE (APRESOLINE) 25 MG tablet Take 1 tablet (25 mg total) by mouth 3 (three) times daily. Patient taking differently: Take 25 mg by mouth 4 (four) times daily.  11/15/17  Yes Emokpae, Courage, MD  hydrocortisone (ANUSOL-HC) 25 MG suppository Place 25 mg rectally 2 (two) times daily. 07/23/18 07/29/18 Yes [provider]  insulin aspart (NOVOLOG) 100 UNIT/ML injection Inject 0-15 Units into the skin 3 (three) times daily with meals. Patient taking differently: Inject 0-12 Units into the skin See admin instructions. Inject 0-12 units into the skin 2 times a day, per sliding scale: BGL 60-150 = 0 unit(s); 151-200 = 2 units; 201-250 = 6 units; 251-300 = 8 units; 301-350 = 10 units; 351-400 = 12 units; [phone] = CALL MD 11/15/17  Yes Emokpae, Courage, MD  Insulin Glargine (BASAGLAR KWIKPEN) 100 UNIT/ML SOPN Inject 55 Units into the skin at bedtime.   Yes [provider]  linagliptin (TRADJENTA) 5 MG TABS tablet Take 5 mg by mouth daily.   Yes [provider]  magnesium oxide (MAG-OX) 400 MG tablet Take 400 mg by mouth daily.   Yes [provider]  NON FORMULARY Take 120 mLs by mouth See admin instructions. MedPass; Drink 120 ml's by mouth once a day  Yes [provider]  Olopatadine HCl 0.2 % SOLN Place 1 drop into both eyes daily. 07/08/17  Yes [provider]  OXYGEN Inhale 2 L into the lungs as needed (FOR SATS <90%).    Yes [provider]  pantoprazole (PROTONIX) 20 MG tablet Take 20 mg by mouth 2 (two) times daily.  07/04/17  Yes [provider]  rOPINIRole (REQUIP) 1 MG tablet Take 1 mg by mouth 2 (two) times daily.   Yes [provider]  torsemide (DEMADEX) 20 MG tablet Take 2  tablets (40 mg total) by mouth daily. 07/23/17  Yes Eugenie Filler, MD  traMADol (ULTRAM) 50 MG tablet Take 0.5 tablets (25 mg total) by mouth every 12 (twelve) hours as needed for moderate pain. Patient taking differently: Take 25 mg by mouth every 12 (twelve) hours as needed (for pain).  11/15/17  Yes Emokpae, Courage, MD  vitamin B-12 (CYANOCOBALAMIN) 1000 MCG tablet Take 1,000 mcg by mouth daily.   Yes [provider]  Dextromethorphan-guaiFENesin 10-100 MG/5ML liquid Take 10 mLs by mouth every 4 (four) hours as needed (cough).    [provider]  fluticasone (FLONASE) 50 MCG/ACT nasal spray Place 1 spray into both nostrils daily.    [provider]  ipratropium-albuterol (DUONEB) 0.5-2.5 (3) MG/3ML SOLN Take 3 mLs by nebulization 3 (three) times daily. Patient not taking: Reported on 07/23/2018 11/15/17   Roxan Hockey, MD  LEVEMIR FLEXTOUCH 100 UNIT/ML Pen Inject 45 Units into the skin daily. 10/18/17   [provider]  Menthol, Topical Analgesic, (BIOFREEZE ROLL-ON) 4 % GEL Apply 1 application topically 2 (two) times daily.    [provider]  predniSONE (DELTASONE) 20 MG tablet Take 1 tablet (20 mg total) by mouth daily with breakfast. Patient not taking: Reported on 07/23/2018 11/15/17   Roxan Hockey, MD  PRESCRIPTION MEDICATION Apply 1 application topically every Monday. selsum blue shampoo for itchy/flaky scalp    [provider]  senna-docusate (SENOKOT-S) 8.6-50 MG tablet Take 2 tablets by mouth at bedtime. Patient not taking: Reported on 07/23/2018 11/15/17 11/15/18  Roxan Hockey, MD  simethicone (MYLICON) 80 MG chewable tablet Chew 80 mg by mouth 3 (three) times daily as needed for flatulence (and bloating).    [provider]  sodium chloride (DEEP SEA NASAL SPRAY) 0.65 % nasal spray Place 1 spray into the nose 4 (four) times daily as needed for congestion.     [provider]  tetrahydrozoline 0.05 % ophthalmic  solution Place 2 drops into both eyes 2 (two) times daily.    [provider]  traZODone (DESYREL) 50 MG tablet Take 50 mg by mouth at bedtime. 06/25/17   [provider]     Vital Signs: BP (!) 183/57 (BP Location: Right Arm)    Pulse 72    Temp (!) 97.4 F (36.3 C) (Oral)    Resp 16    Ht 5' (1.524 m)    Wt 131 lb 2.8 oz (59.5 kg)    SpO2 94%    BMI 25.62 kg/m   Physical Exam Vitals signs and nursing note reviewed.  Constitutional:      General: She is not in acute distress.    Appearance: Normal appearance.  Pulmonary:     Effort: Pulmonary effort is normal. No respiratory distress.  Skin:    General: Skin is warm and dry.     Comments: Right groin incision soft without active bleeding or hematoma.  Neurological:  Mental Status: She is alert and oriented to person, place, and time.  Psychiatric:        Mood and Affect: Mood normal.        Behavior: Behavior normal.        Thought Content: Thought content normal.        Judgment: Judgment normal.     Imaging: Ct Abdomen Pelvis Wo Contrast  Result Date: 07/23/2018 CLINICAL DATA:  Vaginal bleeding EXAM: CT ABDOMEN AND PELVIS WITHOUT CONTRAST TECHNIQUE: Multidetector CT imaging of the abdomen and pelvis was performed following the standard protocol without IV contrast. COMPARISON:  07/19/2017 FINDINGS: Lower chest:  Extensive atherosclerotic calcification Hepatobiliary: Hepatic steatosis.Cholelithiasis without evidence of inflammation or biliary obstruction. Pancreas: Unremarkable. Spleen: Unremarkable. Adrenals/Urinary Tract: Negative adrenals. No hydronephrosis or ureteral stone. Right calcifications are at least predominantly vascular. Unremarkable bladder. Stomach/Bowel: Diffuse formed stool. No bowel obstruction. No evidence of bowel inflammation. Vascular/Lymphatic: Extensive atherosclerotic calcification. Focal calcification of a peripheral ileocolic vessels is chronic. No mass or adenopathy. There is  retroperitoneal stranding at the level of the pelvis that is stable from prior. Suspect this is related patient's history of radiotherapy. Reproductive:Hysterectomy.  No visualized ovaries. Other: No ascites or pneumoperitoneum. Musculoskeletal: No acute abnormalities. Disc narrowing and facet arthropathy with L4-5 anterolisthesis. IMPRESSION: 1. No acute finding or change from 07/19/2017. 2. Hysterectomy. 3. Hepatic steatosis and cholelithiasis. Electronically Signed   By: Monte Fantasia M.D.   On: 07/23/2018 04:23   Nm Gi Blood Loss  Result Date: 07/25/2018 CLINICAL DATA:  Active rectal bleed/ eval for bleeding origin. EXAM: NUCLEAR MEDICINE GASTROINTESTINAL BLEEDING SCAN TECHNIQUE: Sequential abdominal images were obtained following intravenous administration of Tc-41m labeled red blood cells. RADIOPHARMACEUTICALS:  26.3 mCi Tc-73m pertechnetate in-vitro labeled red cells. COMPARISON:  CT of the abdomen and pelvis on 07/23/2018 FINDINGS: There is abnormal activity within the proximal to mid transverse colon, leading to intraluminal activity within the distal transverse colon, splenic flexure, and proximal descending colon. Expected blood pool activity elsewhere. IMPRESSION: Most likely site of bleeding is within the proximal to mid transverse colon. These results were called by telephone at the time of interpretation on 07/25/2018 at 1:32 pm to Dr. Watt Climes, who verbally acknowledged these results. The findings were discussed with Dr. Earleen Newport at 13:54. Electronically Signed   By: Nolon Nations M.D.   On: 07/25/2018 13:57   US Pelvic Complete With Transvaginal  Result Date: 07/23/2018 CLINICAL DATA:  Postmenopausal vaginal bleeding. Previous hysterectomy. EXAM: TRANSABDOMINAL AND TRANSVAGINAL ULTRASOUND OF PELVIS TECHNIQUE: Both transabdominal and transvaginal ultrasound examinations of the pelvis were performed. Transabdominal technique was performed for global imaging of the pelvis including uterus,  ovaries, adnexal regions, and pelvic cul-de-sac. It was necessary to proceed with endovaginal exam following the transabdominal exam to visualize the ovaries. COMPARISON:  None FINDINGS: Uterus Uterus is surgically absent. Endometrium Surgically absent. Right ovary Not visualized. Left ovary Not visualized. Other findings No abnormal free fluid. Filled bladder is present without filling defect or wall thickening. IMPRESSION: Uterus is surgically absent. Ovaries are not visualized but no mass or abnormal fluid demonstrated in the visualized adnexal regions. Electronically Signed   By: Lucienne Capers M.D.   On: 07/23/2018 03:31    Labs:  CBC: Recent Labs    07/23/18 0105  07/24/18 0026  07/25/18 0321  07/25/18 1451 07/25/18 2046 07/26/18 0332 07/26/18 0913  WBC 10.1  --  10.8*  --  8.8  --   --   --  7.3  --  HGB 7.9*   < > 8.9*   < > 9.0*   < > 7.5* 6.8* 8.1* 8.5*  HCT 25.7*   < > 27.9*   < > 28.7*   < > 24.0* 21.7* 24.4* 26.5*  PLT 288  --  273  --  235  --   --   --  206  --    < > = values in this interval not displayed.    COAGS: Recent Labs    07/23/18 0105  INR 1.0    BMP: Recent Labs    07/23/18 0105 07/24/18 0026 07/25/18 0321 07/26/18 0332  NA 133* 137 137 135  K 4.6 5.5* 4.1 4.0  CL 95* 97* 96* 100  CO2 24 27 28 26   GLUCOSE 265* 213* 175* 169*  BUN 52* 51* 47* 50*  CALCIUM 10.0 10.0 10.1 8.9  CREATININE 2.89* 2.85* 2.80* 3.31*  GFRNONAA 14* 14* 15* 12*  GFRAA 16* 17* 17* 14*    LIVER FUNCTION TESTS: Recent Labs    11/14/17 1205 07/26/18 0332  BILITOT 0.5 1.9*  AST 14* 17  ALT 12 21  ALKPHOS 35* 47  PROT 6.8 5.4*  ALBUMIN 3.3* 2.6*    Assessment and Plan:  Lower GI bleed s/p attempted embolization (no findings to justify empiric or targeted embolization) 07/25/2018 by Dr. Earleen Newport. Right groin incision stable. Appreciate and agree with [facility] management. Please call IR with questions/concerns.   Electronically Signed: Earley Abide,  PA-C 07/26/2018, 12:08 PM   I spent a total of 25 Minutes at the the patient's bedside AND on the patient's hospital floor or unit, greater than 50% of which was counseling/coordinating care for lower GI bleed s/p attempted embolization.

## 2018-07-26 NOTE — Progress Notes (Signed)
Subjective: No further bleeding.  Objective: Vital signs in last 24 hours: Temp:  [97.4 F (36.3 C)-98.8 F (37.1 C)] 97.4 F (36.3 C) (03/14 0744) Pulse Rate:  [60-82] 72 (03/14 0744) Resp:  [14-23] 16 (03/14 0744) BP: (98-183)/(28-93) 183/57 (03/14 0744) SpO2:  [93 %-100 %] 94 % (03/14 0744) FiO2 (%):  [16 %] 16 % (03/13 2324) Weight:  [59.5 kg] 59.5 kg (03/14 0500) Weight change:  Last BM Date: 07/25/18  PE: GEN:  Elderly, NAD ABD:  Soft, non-tender  Lab Results: CBC    Component Value Date/Time   WBC 7.3 07/26/2018 0332   RBC 2.58 (L) 07/26/2018 0332   HGB 8.5 (L) 07/26/2018 0913   HCT 26.5 (L) 07/26/2018 0913   PLT 206 07/26/2018 0332   MCV 94.6 07/26/2018 0332   MCH 31.4 07/26/2018 0332   MCHC 33.2 07/26/2018 0332   RDW 19.3 (H) 07/26/2018 0332   LYMPHSABS 1.8 07/26/2018 0332   MONOABS 0.6 07/26/2018 0332   EOSABS 0.2 07/26/2018 0332   BASOSABS 0.0 07/26/2018 0332   CMP     Component Value Date/Time   NA 135 07/26/2018 0332   K 4.0 07/26/2018 0332   CL 100 07/26/2018 0332   CO2 26 07/26/2018 0332   GLUCOSE 169 (H) 07/26/2018 0332   BUN 50 (H) 07/26/2018 0332   CREATININE 3.31 (H) 07/26/2018 0332   CALCIUM 8.9 07/26/2018 0332   CALCIUM 12.4 (H) 06/09/2009 1716   PROT 5.4 (L) 07/26/2018 0332   ALBUMIN 2.6 (L) 07/26/2018 0332   AST 17 07/26/2018 0332   ALT 21 07/26/2018 0332   ALKPHOS 47 07/26/2018 0332   BILITOT 1.9 (H) 07/26/2018 0332   GFRNONAA 12 (L) 07/26/2018 0332   GFRAA 14 (L) 07/26/2018 0332    Assessment:  1.  Lower GI bleeding with positive nuclear scan (subsequent negative angiogram), suspect diverticular, resolved at this time. 2.  Acute blood loss anemia. 3.  History stroke, reportedly not on anticoagulants.  Plan:  1.  Advance diet. 2.  Follow CBC. 3.  If no further bleeding and CBC stable day over the next 24 hours, consider possible discharge in next 1-2 days. 4.  [facility] will follow.   Landry Dyke 07/26/2018, 12:31  PM   Cell [phone] If no answer or after 5 PM call [phone]

## 2018-07-26 NOTE — Progress Notes (Signed)
PROGRESS NOTE    [name]  FTD:[id number] DOB: 25-Jan-1929 DOA: 07/23/2018 PCP: Josetta Huddle, MD  Brief Narrative:  HPI per Dr. Mitzi Hansen on 07/23/2018 [name] is a 85 y.o. female with medical history significant for uterine cancer status post hysterectomy and radiation, chronic diastolic CHF, insulin-dependent diabetes mellitus, chronic kidney disease stage IV, depression with anxiety, and chronic microcytic anemia, now presenting to the emergency department for evaluation of bleeding that began yesterday afternoon.  Patient reports a history of frequent dark red and maroon blood in her bowel movements which she had been told was due to hemorrhoids, and reports that she had acute increase in bleeding yesterday afternoon, describing dark and maroon blood "pouring out."  She thought that the blood was coming from her rectum, but was noted to have blood and clots at the introitus in the ED, was asked whether the bleeding she has been having was coming from the vagina or rectum, and she was not sure.  She denies any abdominal pain.  She denies any vomiting.  No recent fevers, chills, chest pain, or cough  **Interim History Presented to the emergency department nursing facility with rectal bleeding.  GI was consulted, but after discussions with family will try and avoid colonoscopy. Was stable from a bleeding standpoint yesterday AM and so NM Bleeding Scan was ordered and was positive given further drop in Hb. IR was consulted and she underwent Mesenteric Angiogram to include Celiac, SMA, and IMA and they were all patent and there was no finding to justify empiric or targeted embolization of lower GIB. Hb/Hct Further dropped overnight and she was given another unit of pRBC.   Assessment & Plan:   Principal Problem:   Acute GI bleeding Active Problems:   Macrocytic anemia   Type 2 diabetes mellitus with stage 4 chronic kidney disease (HCC)   GI bleed   CKD (chronic kidney disease),  stage IV (HCC)   Depression with anxiety  Acute GI Bleeding with BRBPR; likely lower GIB Macrocytic Anemia -Presented with reported frequent dark red and maroon blood in her bowel movements that she had been told was from hemorrhoids,  -Reported acute increase in bleeding since the afternoon of 3/10, determined to likely be coming from rectum.  -In ED with no fistula or abnormal fluid on pelvic US and no acute findings on CT abd/pelvis.   -Gastroenterology consulted, but agreed with family against colonoscopy at this time.   -Had Active GIB yesterday and Hb/Hct had improved to 9.0/28.7 and dropped down to 7.9/25.6 and further dropped to 7.5/24.0 and subsequently 6.8/21.7 and so she was transfused another 1 unit of pRBC's. -She is total 3 units of pRBC's and now Hb/Hc is improved and is 8.5/26.5 -Will be Obtaining NM GI Bleeding Scan and this is was positive with Most likely site of bleeding is within the proximal to mid transverse colon. -IR was consulted and she Mesenteric Angiogram to include Celiac, SMA, and IMA and they were all patent and there was no finding to justify empiric or targeted embolization of lower GIB. -Appreciated GI consultative services and Dr. Watt Climes following  -C/w Ferrous Sulfate 325 mg po Daily -Continue monitor H&H's -C/w IV Pantoprazole 40 mg q12h for now -C/w Hydrocortisone 25 mg po BID -Transfuse as necessary to a goal Hb of >7.0 -Repeat CBC in AM  -Currently still on a CLD and will advance when ok with GI  Chronic Diastolic Congestive Heart Failure -Patient appears to be slightly dry today. -Strict  I&Os and Daily Weights; Weights aren't done -Patient is +1,194 mL since admission -Hold Torsemide40 mg po Daily given worsened Renal Fxn in the setting of Contrast -Will gently Rehydrate with NS at 50 mL/hr x 1 day  -Continue to Monitor Volume Status Carefully   AKI on CKD IV -SCr is 2.89 on admission, slightly increased from prior and repeat BUN/Cr went  from 51/2.85 -> 47/2.80 -> 50/3.31 -WORSENED in the setting of Torsemide and Contrast for Angiogram -Continue to Renally-dose medications and avoid Nephrotoxic Medications if Possible -Avoid Hypotension and Contrast Dyes if possible -Stopped Torsemide and started gentle IVF hydration with NS at 50 mL/hr -Continue to Monitor and Trend Renal Fxn  -Repeat CMP in AM   Insulin-Dependent DM -Last A1c was 6.5% one yr ago. -Check CBG's and use a SSI with Novolog for now -CBG's ranging from 134-158  Depression and Anxeity -Stable. -Continue Depakote 125 mg po Daily and 250 mg po Daily qHS and Trazodone and 50 mg po qHS  Hyperkalemia -Potassium noted to be elevated at 5.5 and repeat this AM was 4.0 -Gentle IV fluids of normal saline at 50 mL/hr restarted today  -Continue to Monitor and repeat CMP in AM   Obesity -Estimated body mass index is 25.62 kg/m as calculated from the following:   Height as of this encounter: 5' (1.524 m).   Weight as of this encounter: 59.5 kg. -Weight Loss and Dietary Counseling given  Gout -Hold Allopurinol 150 mg po qHS given worsened Renal Function  Hx of Endometrial Cancer -S/p Hysterectomy -Has had Radiation Therapy with External Beam and tehn Brachytherapy  Chronic Respiratory Failure with Hypoxia -C/w DuoNeb 3 mL BID and 3 mL q6hprn Wheezing and SOB  Hyperbilirubinemia -Patient's T Bili was 1.9 this AM -Unclear Etiology Currently  -Continue to Monitor and Trend and Repeat CMP in AM   DVT prophylaxis: SCDs given recent GI Bleeding  Code Status: DO NOT RESUSCITATE Family Communication: No family present at beside  Disposition Plan: Pending cessastion of GI Bleeding and clearance by GI  Consultants:   [facility]  Interventional Radiology   Procedures:  NM Bleeding Scan  Procedure: US guided right CFA access, with mesenteric angio to include celiac, SMA, and IMA.   Closure of puncture with Exoseal. Done by Dr. Damita Dunnings   Antimicrobials:  Anti-infectives (From admission, onward)   None     Subjective: Seen and examined at bedside this AM was wanting to sleep and not really interacting during the encounter.  Denies chest pain, lightheadedness or dizziness.  No nausea or vomiting.  Denies any abdominal pain.  Has not had any further bleeding episodes.  No other concerns or complaints at this time.  Objective: Vitals:   07/26/18 0133 07/26/18 0500 07/26/18 0726 07/26/18 0744  BP: (!) 139/39   (!) 183/57  Pulse: 63   72  Resp: 14   16  Temp:    (!) 97.4 F (36.3 C)  TempSrc:    Oral  SpO2: 97%  99% 94%  Weight:  59.5 kg    Height:        Intake/Output Summary (Last 24 hours) at 07/26/2018 1127 Last data filed at 07/26/2018 0600 Gross per 24 hour  Intake 1296 ml  Output 300 ml  Net 996 ml   Filed Weights   07/23/18 0045 07/26/18 0500  Weight: 74.8 kg 59.5 kg   Examination: Physical Exam:  Constitutional: Well-nourished, well-developed obese elderly Caucasian female currently no acute distress appears calm and  comfortable to sleep Eyes: Lids are normal but conjunctive have some pallor.  Sclera anicteric ENMT: External ears nose appear normal.  Slightly hard of hearing Neck: Appears supple no JVD Respiratory: Diminished auscultation bilaterally no appreciable wheezing, rales, rhonchi.  Patient not tachypneic wheezing and accessory muscle breathe Cardiovascular: Regular rate and rhythm.  Very little extremity edema Abdomen: Soft, nontender to palpate.  Mildly distended secondary body habitus.  Bowel sounds present in 4 quadrants GU: Deferred Musculoskeletal: No clubbing or cyanosis.  No joint deformities noted total extremities Skin: No appreciable rashes or lesions limited skin evaluation. Neurologic: Cranial nerves II through XII grossly intact no appreciable focal deficits.  Romberg sign and cerebellar reflexes were not assessed Psychiatric: Normal judgment and insight.  She is is  somnolent and worn asleep but easily arousable.  Slightly agitated mood and appropriate affect  Data Reviewed: I have personally reviewed following labs and imaging studies  CBC: Recent Labs  Lab 07/23/18 0105  07/24/18 0026  07/25/18 0321 07/25/18 0948 07/25/18 1451 07/25/18 2046 07/26/18 0332 07/26/18 0913  WBC 10.1  --  10.8*  --  8.8  --   --   --  7.3  --   NEUTROABS 6.1  --   --   --   --   --   --   --  4.6  --   HGB 7.9*   < > 8.9*   < > 9.0* 7.9* 7.5* 6.8* 8.1* 8.5*  HCT 25.7*   < > 27.9*   < > 28.7* 25.6* 24.0* 21.7* 24.4* 26.5*  MCV 105.8*  --  99.3  --  100.3*  --   --   --  94.6  --   PLT 288  --  273  --  235  --   --   --  206  --    < > = values in this interval not displayed.   Basic Metabolic Panel: Recent Labs  Lab 07/23/18 0105 07/24/18 0026 07/25/18 0321 07/26/18 0332  NA 133* 137 137 135  K 4.6 5.5* 4.1 4.0  CL 95* 97* 96* 100  CO2 24 27 28 26   GLUCOSE 265* 213* 175* 169*  BUN 52* 51* 47* 50*  CREATININE 2.89* 2.85* 2.80* 3.31*  CALCIUM 10.0 10.0 10.1 8.9  MG  --   --   --  1.8  PHOS  --   --   --  4.4   GFR: Estimated Creatinine Clearance: 9.7 mL/min (A) (by C-G formula based on SCr of 3.31 mg/dL (H)). Liver Function Tests: Recent Labs  Lab 07/26/18 0332  AST 17  ALT 21  ALKPHOS 47  BILITOT 1.9*  PROT 5.4*  ALBUMIN 2.6*   No results for input(s): LIPASE, AMYLASE in the last 168 hours. No results for input(s): AMMONIA in the last 168 hours. Coagulation Profile: Recent Labs  Lab 07/23/18 0105  INR 1.0   Cardiac Enzymes: No results for input(s): CKTOTAL, CKMB, CKMBINDEX, TROPONINI in the last 168 hours. BNP (last 3 results) No results for input(s): PROBNP in the last 8760 hours. HbA1C: No results for input(s): HGBA1C in the last 72 hours. CBG: Recent Labs  Lab 07/25/18 0754 07/25/18 2100 07/26/18 0049 07/26/18 0422 07/26/18 0744  GLUCAP 143* 189* 151* 134* 158*   Lipid Profile: No results for input(s): CHOL, HDL,  LDLCALC, TRIG, CHOLHDL, LDLDIRECT in the last 72 hours. Thyroid Function Tests: No results for input(s): TSH, T4TOTAL, FREET4, T3FREE, THYROIDAB in the last 72 hours. Anemia Panel:  No results for input(s): VITAMINB12, FOLATE, FERRITIN, TIBC, IRON, RETICCTPCT in the last 72 hours. Sepsis Labs: No results for input(s): PROCALCITON, LATICACIDVEN in the last 168 hours.  Recent Results (from the past 240 hour(s))  MRSA PCR Screening     Status: None   Collection Time: 07/24/18  4:29 AM  Result Value Ref Range Status   MRSA by PCR NEGATIVE NEGATIVE Final    Comment:        The GeneXpert MRSA Assay (FDA approved for NASAL specimens only), is one component of a comprehensive MRSA colonization surveillance program. It is not intended to diagnose MRSA infection nor to guide or monitor treatment for MRSA infections. Performed at [facility], [place] [address], [place], [place] [address]     Radiology Studies: Nm Gi Blood Loss  Result Date: 07/25/2018 CLINICAL DATA:  Active rectal bleed/ eval for bleeding origin. EXAM: NUCLEAR MEDICINE GASTROINTESTINAL BLEEDING SCAN TECHNIQUE: Sequential abdominal images were obtained following intravenous administration of Tc-88m labeled red blood cells. RADIOPHARMACEUTICALS:  26.3 mCi Tc-60m pertechnetate in-vitro labeled red cells. COMPARISON:  CT of the abdomen and pelvis on 07/23/2018 FINDINGS: There is abnormal activity within the proximal to mid transverse colon, leading to intraluminal activity within the distal transverse colon, splenic flexure, and proximal descending colon. Expected blood pool activity elsewhere. IMPRESSION: Most likely site of bleeding is within the proximal to mid transverse colon. These results were called by telephone at the time of interpretation on 07/25/2018 at 1:32 pm to Dr. Watt Climes, who verbally acknowledged these results. The findings were discussed with Dr. Earleen Newport at 13:54. Electronically Signed   By: Nolon Nations M.D.    On: 07/25/2018 13:57    Scheduled Meds: . allopurinol  150 mg Oral QHS  . divalproex  125 mg Oral Daily  . divalproex  250 mg Oral QHS  . ferrous sulfate  325 mg Oral Q breakfast  . gabapentin  400 mg Oral QHS  . hydrocortisone  25 mg Rectal BID  . insulin aspart  0-9 Units Subcutaneous Q4H  . pantoprazole (PROTONIX) IV  40 mg Intravenous Q12H  . rOPINIRole  1 mg Oral BID  . torsemide  40 mg Oral Daily  . traZODone  50 mg Oral QHS   Continuous Infusions:   LOS: 2 days   Kerney Elbe, DO [facility] PAGER is on AMION  If 7PM-7AM, please contact night-coverage [email or web address] Password [facility] 07/26/2018, 11:27 AM

## 2018-07-27 LAB — CBC WITH DIFFERENTIAL/PLATELET
ABS IMMATURE GRANULOCYTES: 0.05 10*3/uL (ref 0.00–0.07)
ABS IMMATURE GRANULOCYTES: 0.05 10*3/uL (ref 0.00–0.07)
Abs Immature Granulocytes: 0.06 10*3/uL (ref 0.00–0.07)
Basophils Absolute: 0 10*3/uL (ref 0.0–0.1)
Basophils Absolute: 0 10*3/uL (ref 0.0–0.1)
Basophils Absolute: 0 10*3/uL (ref 0.0–0.1)
Basophils Relative: 0 %
Basophils Relative: 0 %
Basophils Relative: 0 %
EOS ABS: 0.2 10*3/uL (ref 0.0–0.5)
Eosinophils Absolute: 0.3 10*3/uL (ref 0.0–0.5)
Eosinophils Absolute: 0.2 10*3/uL (ref 0.0–0.5)
Eosinophils Relative: 3 %
Eosinophils Relative: 2 %
Eosinophils Relative: 2 %
HCT: 23 % — ABNORMAL LOW (ref 36.0–46.0)
HCT: 29.3 % — ABNORMAL LOW (ref 36.0–46.0)
HEMATOCRIT: 26.3 % — AB (ref 36.0–46.0)
HEMOGLOBIN: 9.2 g/dL — AB (ref 12.0–15.0)
Hemoglobin: 7.5 g/dL — ABNORMAL LOW (ref 12.0–15.0)
Hemoglobin: 8.5 g/dL — ABNORMAL LOW (ref 12.0–15.0)
Immature Granulocytes: 1 %
Immature Granulocytes: 1 %
Immature Granulocytes: 1 %
LYMPHS ABS: 2.2 10*3/uL (ref 0.7–4.0)
Lymphocytes Relative: 28 %
Lymphocytes Relative: 19 %
Lymphocytes Relative: 24 %
Lymphs Abs: 2.3 10*3/uL (ref 0.7–4.0)
Lymphs Abs: 1.5 10*3/uL (ref 0.7–4.0)
MCH: 31.1 pg (ref 26.0–34.0)
MCH: 29.5 pg (ref 26.0–34.0)
MCH: 30.8 pg (ref 26.0–34.0)
MCHC: 32.6 g/dL (ref 30.0–36.0)
MCHC: 31.4 g/dL (ref 30.0–36.0)
MCHC: 32.3 g/dL (ref 30.0–36.0)
MCV: 95.4 fL (ref 80.0–100.0)
MCV: 93.9 fL (ref 80.0–100.0)
MCV: 95.3 fL (ref 80.0–100.0)
Monocytes Absolute: 0.8 10*3/uL (ref 0.1–1.0)
Monocytes Absolute: 0.6 10*3/uL (ref 0.1–1.0)
Monocytes Absolute: 0.8 10*3/uL (ref 0.1–1.0)
Monocytes Relative: 9 %
Monocytes Relative: 7 %
Monocytes Relative: 8 %
NEUTROS ABS: 5.7 10*3/uL (ref 1.7–7.7)
Neutro Abs: 4.7 10*3/uL (ref 1.7–7.7)
Neutro Abs: 6.1 10*3/uL (ref 1.7–7.7)
Neutrophils Relative %: 59 %
Neutrophils Relative %: 71 %
Neutrophils Relative %: 65 %
Platelets: 221 10*3/uL (ref 150–400)
Platelets: 226 10*3/uL (ref 150–400)
Platelets: 230 10*3/uL (ref 150–400)
RBC: 2.41 MIL/uL — ABNORMAL LOW (ref 3.87–5.11)
RBC: 3.12 MIL/uL — ABNORMAL LOW (ref 3.87–5.11)
RBC: 2.76 MIL/uL — ABNORMAL LOW (ref 3.87–5.11)
RDW: 19.6 % — ABNORMAL HIGH (ref 11.5–15.5)
RDW: 18.1 % — ABNORMAL HIGH (ref 11.5–15.5)
RDW: 18.2 % — AB (ref 11.5–15.5)
WBC: 8 10*3/uL (ref 4.0–10.5)
WBC: 8 10*3/uL (ref 4.0–10.5)
WBC: 9.4 10*3/uL (ref 4.0–10.5)
nRBC: 0.2 % (ref 0.0–0.2)
nRBC: 0 % (ref 0.0–0.2)
nRBC: 0 % (ref 0.0–0.2)

## 2018-07-27 LAB — TYPE AND SCREEN
ABO/RH(D): A NEG
Antibody Screen: NEGATIVE
Unit division: 0
Unit division: 0
Unit division: 0

## 2018-07-27 LAB — BPAM RBC
Blood Product Expiration Date: 202003182359
Blood Product Expiration Date: 202003232359
Blood Product Expiration Date: 202004092359
ISSUE DATE / TIME: 202003111319
ISSUE DATE / TIME: 202003132236
Unit Type and Rh: 600
Unit Type and Rh: 600
Unit Type and Rh: 600

## 2018-07-27 LAB — HEMOGLOBIN AND HEMATOCRIT, BLOOD
HCT: 23.2 % — ABNORMAL LOW (ref 36.0–46.0)
Hemoglobin: 7.2 g/dL — ABNORMAL LOW (ref 12.0–15.0)

## 2018-07-27 LAB — COMPREHENSIVE METABOLIC PANEL
ALK PHOS: 51 U/L (ref 38–126)
ALT: 17 U/L (ref 0–44)
ANION GAP: 8 (ref 5–15)
AST: 13 U/L — ABNORMAL LOW (ref 15–41)
Albumin: 2.6 g/dL — ABNORMAL LOW (ref 3.5–5.0)
BUN: 44 mg/dL — ABNORMAL HIGH (ref 8–23)
CALCIUM: 8.9 mg/dL (ref 8.9–10.3)
CO2: 26 mmol/L (ref 22–32)
Chloride: 101 mmol/L (ref 98–111)
Creatinine, Ser: 3.17 mg/dL — ABNORMAL HIGH (ref 0.44–1.00)
GFR calc Af Amer: 15 mL/min — ABNORMAL LOW (ref >60)
GFR calc non Af Amer: 13 mL/min — ABNORMAL LOW (ref >60)
Glucose, Bld: 142 mg/dL — ABNORMAL HIGH (ref 70–99)
Potassium: 3.7 mmol/L (ref 3.5–5.1)
Sodium: 135 mmol/L (ref 135–145)
Total Bilirubin: 0.9 mg/dL (ref 0.3–1.2)
Total Protein: 5.4 g/dL — ABNORMAL LOW (ref 6.5–8.1)

## 2018-07-27 LAB — GLUCOSE, CAPILLARY
Glucose-Capillary: 123 mg/dL — ABNORMAL HIGH (ref 70–99)
Glucose-Capillary: 125 mg/dL — ABNORMAL HIGH (ref 70–99)
Glucose-Capillary: 183 mg/dL — ABNORMAL HIGH (ref 70–99)
Glucose-Capillary: 219 mg/dL — ABNORMAL HIGH (ref 70–99)
Glucose-Capillary: 227 mg/dL — ABNORMAL HIGH (ref 70–99)
Glucose-Capillary: 268 mg/dL — ABNORMAL HIGH (ref 70–99)
Glucose-Capillary: 269 mg/dL — ABNORMAL HIGH (ref 70–99)

## 2018-07-27 LAB — PHOSPHORUS: Phosphorus: 3.5 mg/dL (ref 2.5–4.6)

## 2018-07-27 LAB — MAGNESIUM: Magnesium: 1.8 mg/dL (ref 1.7–2.4)

## 2018-07-27 LAB — PREPARE RBC (CROSSMATCH)

## 2018-07-27 MED ORDER — SODIUM CHLORIDE 0.9% IV SOLUTION
Freq: Once | INTRAVENOUS | Status: AC
  Administered 2018-07-27: 14:00:00 via INTRAVENOUS

## 2018-07-27 MED ORDER — SODIUM CHLORIDE 0.9 % IV SOLN
INTRAVENOUS | Status: AC
  Administered 2018-07-27 (×2): via INTRAVENOUS

## 2018-07-27 MED ORDER — SODIUM CHLORIDE 0.9% IV SOLUTION: Freq: Once | INTRAVENOUS | Status: DC

## 2018-07-27 NOTE — Progress Notes (Signed)
PROGRESS NOTE    [name]  WEX:[id number] DOB: [birth year] DOA: 07/23/2018 PCP: Josetta Huddle, MD  Brief Narrative:  HPI per Dr. Mitzi Hansen on 07/23/2018 [name] is a 85 y.o. female with medical history significant for uterine cancer status post hysterectomy and radiation, chronic diastolic CHF, insulin-dependent diabetes mellitus, chronic kidney disease stage IV, depression with anxiety, and chronic microcytic anemia, now presenting to the emergency department for evaluation of bleeding that began yesterday afternoon.  Patient reports a history of frequent dark red and maroon blood in her bowel movements which she had been told was due to hemorrhoids, and reports that she had acute increase in bleeding yesterday afternoon, describing dark and maroon blood "pouring out."  She thought that the blood was coming from her rectum, but was noted to have blood and clots at the introitus in the ED, was asked whether the bleeding she has been having was coming from the vagina or rectum, and she was not sure.  She denies any abdominal pain.  She denies any vomiting.  No recent fevers, chills, chest pain, or cough  **Interim History Presented to the emergency department nursing facility with rectal bleeding.  GI was consulted, but after discussions with family will try and avoid colonoscopy. Was stable from a bleeding standpoint yesterday AM and so NM Bleeding Scan was ordered and was positive given further drop in Hb. IR was consulted and she underwent Mesenteric Angiogram to include Celiac, SMA, and IMA and they were all patent and there was no finding to justify empiric or targeted embolization of lower GIB. Hb/Hct Further dropped the night before and last night and she was given another unit of pRBC.   Assessment & Plan:   Principal Problem:   Acute GI bleeding Active Problems:   Macrocytic anemia   Type 2 diabetes mellitus with stage 4 chronic kidney disease (HCC)   GI bleed   CKD  (chronic kidney disease), stage IV (HCC)   Depression with anxiety  Acute GI Bleeding with BRBPR; likely lower GIB Macrocytic Anemia -Presented with reported frequent dark red and maroon blood in her bowel movements that she had been told was from hemorrhoids,  -Reported acute increase in bleeding since the afternoon of 3/10, determined to likely be coming from rectum.  -In ED with no fistula or abnormal fluid on pelvic US and no acute findings on CT abd/pelvis.   -Gastroenterology consulted, but agreed with family against colonoscopy at this time.   -Had Active GIB yesterday and Hb/Hct had improved to 9.0/28.7 and dropped down to 7.9/25.6 and further dropped to 7.5/24.0 and subsequently 6.8/21.7 and so she was transfused another 1 unit of pRBC's. -She is total 3 units of pRBC's and Hb/Hct had improved to 8.5/26.5 but dropped again to 7.2/23.2 so will transfuse 1 additional unit of pRBC's -Will be Obtaining NM GI Bleeding Scan and this is was positive with Most likely site of bleeding is within the proximal to mid transverse colon. -IR was consulted and she Mesenteric Angiogram to include Celiac, SMA, and IMA and they were all patent and there was no finding to justify empiric or targeted embolization of lower GIB. -Appreciated GI consultative services and Dr. Watt Climes following  -C/w Ferrous Sulfate 325 mg po Daily -Continue monitor H&H's -C/w IV Pantoprazole 40 mg q12h for now -C/w Hydrocortisone 25 mg po BID -Transfuse as necessary to a goal Hb of >7.0 -Repeat CBC in AM and continue to Monitor for S/Sx of Bleeding -Diet advanced to  FULL Liquid by GI  -Further Care per GI  Chronic Diastolic Congestive Heart Failure -Patient appears to be slightly dry today. -Strict I&Os and Daily Weights; Weights aren't done -Patient is +1,433 mL since admission -Hold Torsemide40 mg po Daily given worsened Renal Fxn in the setting of Contrast -C/w NS at 50 mL/hr x 1 day (to end at 1145) -Continue to  Monitor Volume Status Carefully   AKI on CKD IV -SCr is 2.89 on admission, slightly increased from prior and repeat BUN/Cr went from 51/2.85 -> 47/2.80 -> 50/3.31 -WORSENED in the setting of Torsemide and Contrast for Angiogram; Now BUN Cr is slightly improved to 44/3.17  -Continue to Renally-dose medications and avoid Nephrotoxic Medications if Possible -Avoid Hypotension and Contrast Dyes if possible -Stopped Torsemide and started gentle IVF hydration with NS at 50 mL/hr and to end today at 11:45 and may resume -Continue to Monitor and Trend Renal Fxn  -Repeat CMP in AM   Insulin-Dependent DM -Last A1c was 6.5% one yr ago. -Check CBG's and use a SSI with Novolog for now -CBG's ranging from 123-184  Depression and Anxeity -Stable. -Continue Depakote 125 mg po Daily and 250 mg po Daily qHS and Trazodone and 50 mg po qHS  Hyperkalemia -Potassium noted to be elevated at 5.5 and repeat this AM was 3.7 -Gentle IV fluids of normal saline at 50 mL/hr restarted today  -Continue to Monitor and repeat CMP in AM   Obesity -Estimated body mass index is 24.76 kg/m as calculated from the following:   Height as of this encounter: 5' (1.524 m).   Weight as of this encounter: 57.5 kg. -Weight Loss and Dietary Counseling given  Gout -Hold Allopurinol 150 mg po qHS given worsened Renal Function  Hx of Endometrial Cancer -S/p Hysterectomy -Has had Radiation Therapy with External Beam and tehn Brachytherapy  Chronic Respiratory Failure with Hypoxia -C/w DuoNeb 3 mL BID and 3 mL q6hprn Wheezing and SOB  Hyperbilirubinemia -Patient's T Bili was 1.9 yesterday AM and repeat was 0.9 -Unclear Etiology Currently  -Continue to Monitor and Trend and Repeat CMP in AM   DVT prophylaxis: SCDs given recent GI Bleeding  Code Status: DO NOT RESUSCITATE Family Communication: No family present at beside  Disposition Plan: Pending cessastion of GI Bleeding and clearance by GI  Consultants:    [facility]  Interventional Radiology   Procedures:  NM Bleeding Scan  Procedure: US guided right CFA access, with mesenteric angio to include celiac, SMA, and IMA.   Closure of puncture with Exoseal. Done by Dr. Damita Dunnings   Antimicrobials:  Anti-infectives (From admission, onward)   None     Subjective: Seen and examined at bedside this AM was hungry and wanting to eat.  Nausea, vomiting, lightheadedness or dizziness.  Denied any further GI bleeding and states that she does not know if she has had any. No CP or SOB.   Objective: Vitals:   07/26/18 1632 07/26/18 2311 07/27/18 0600 07/27/18 0803  BP: (!) 156/47 120/78  (!) 166/45  Pulse: 70 93  67  Resp: 16 20  18   Temp: 97.6 F (36.4 C) 98.1 F (36.7 C)  97.6 F (36.4 C)  TempSrc: Oral Oral  Oral  SpO2: 95% 95%  94%  Weight:   57.5 kg   Height:        Intake/Output Summary (Last 24 hours) at 07/27/2018 1111 Last data filed at 07/27/2018 1000 Gross per 24 hour  Intake 238.46 ml  Output -  Net 238.46 ml   Filed Weights   07/23/18 0045 07/26/18 0500 07/27/18 0600  Weight: 74.8 kg 59.5 kg 57.5 kg   Examination: Physical Exam:  Constitutional: Well Nourished, well-developed obese elderly Caucasian female currently no acute distress appears calm and wanting something to eat Eyes: Slightly Miotic pupils and conjunctival pallor.  Lids are normal.  Sclera anicteric ENMT: Ears and nose appear normal.  Slightly hard of hearing Neck: Appears supple no JVD Respiratory: Diminished auscultation bilaterally. No appreciable wheezing and accessory muscles to breathe Cardiovascular: Regular rate and rhythm.  No appreciable murmurs, rubs, gallops.  Little extremity edema noted Abdomen:, Nontender to palpate.  Mildly distended secondary body habitus.  Bowel sounds present GU: Deferred Musculoskeletal: No contractures or cyanosis noted.  No joint deformities Skin: No appreciable rashes or lesions on limited skin  evaluation Neurologic: Cranial nerves II through XII grossly intact no appreciable focal deficits.  Romberg sign cerebellar reflexes were not assessed Psychiatric: Judgment and insight.  She is awake and more alert today.  Not as agitated.  Data Reviewed: I have personally reviewed following labs and imaging studies  CBC: Recent Labs  Lab 07/23/18 0105  07/24/18 0026  07/25/18 0321  07/26/18 0332 07/26/18 0913 07/26/18 1517 07/26/18 2111 07/27/18 0247 07/27/18 0828  WBC 10.1  --  10.8*  --  8.8  --  7.3  --   --   --  8.0  --   NEUTROABS 6.1  --   --   --   --   --  4.6  --   --   --  4.7  --   HGB 7.9*   < > 8.9*   < > 9.0*   < > 8.1* 8.5* 8.6* 8.1* 7.5* 7.2*  HCT 25.7*   < > 27.9*   < > 28.7*   < > 24.4* 26.5* 26.6* 25.5* 23.0* 23.2*  MCV 105.8*  --  99.3  --  100.3*  --  94.6  --   --   --  95.4  --   PLT 288  --  273  --  235  --  206  --   --   --  221  --    < > = values in this interval not displayed.   Basic Metabolic Panel: Recent Labs  Lab 07/23/18 0105 07/24/18 0026 07/25/18 0321 07/26/18 0332 07/27/18 0247  NA 133* 137 137 135 135  K 4.6 5.5* 4.1 4.0 3.7  CL 95* 97* 96* 100 101  CO2 24 27 28 26 26   GLUCOSE 265* 213* 175* 169* 142*  BUN 52* 51* 47* 50* 44*  CREATININE 2.89* 2.85* 2.80* 3.31* 3.17*  CALCIUM 10.0 10.0 10.1 8.9 8.9  MG  --   --   --  1.8 1.8  PHOS  --   --   --  4.4 3.5   GFR: Estimated Creatinine Clearance: 9.9 mL/min (A) (by C-G formula based on SCr of 3.17 mg/dL (H)). Liver Function Tests: Recent Labs  Lab 07/26/18 0332 07/27/18 0247  AST 17 13*  ALT 21 17  ALKPHOS 47 51  BILITOT 1.9* 0.9  PROT 5.4* 5.4*  ALBUMIN 2.6* 2.6*   No results for input(s): LIPASE, AMYLASE in the last 168 hours. No results for input(s): AMMONIA in the last 168 hours. Coagulation Profile: Recent Labs  Lab 07/23/18 0105  INR 1.0   Cardiac Enzymes: No results for input(s): CKTOTAL, CKMB, CKMBINDEX, TROPONINI in the last 168 hours. BNP (last 3  results) No results for input(s): PROBNP in the last 8760 hours. HbA1C: No results for input(s): HGBA1C in the last 72 hours. CBG: Recent Labs  Lab 07/26/18 1633 07/26/18 2005 07/27/18 0004 07/27/18 0358 07/27/18 0743  GLUCAP 220* 184* 183* 125* 123*   Lipid Profile: No results for input(s): CHOL, HDL, LDLCALC, TRIG, CHOLHDL, LDLDIRECT in the last 72 hours. Thyroid Function Tests: No results for input(s): TSH, T4TOTAL, FREET4, T3FREE, THYROIDAB in the last 72 hours. Anemia Panel: No results for input(s): VITAMINB12, FOLATE, FERRITIN, TIBC, IRON, RETICCTPCT in the last 72 hours. Sepsis Labs: No results for input(s): PROCALCITON, LATICACIDVEN in the last 168 hours.  Recent Results (from the past 240 hour(s))  MRSA PCR Screening     Status: None   Collection Time: 07/24/18  4:29 AM  Result Value Ref Range Status   MRSA by PCR NEGATIVE NEGATIVE Final    Comment:        The GeneXpert MRSA Assay (FDA approved for NASAL specimens only), is one component of a comprehensive MRSA colonization surveillance program. It is not intended to diagnose MRSA infection nor to guide or monitor treatment for MRSA infections. Performed at [facility], [place] [address] [place], [place] [address]     Radiology Studies: Nm Gi Blood Loss  Result Date: 07/25/2018 CLINICAL DATA:  Active rectal bleed/ eval for bleeding origin. EXAM: NUCLEAR MEDICINE GASTROINTESTINAL BLEEDING SCAN TECHNIQUE: Sequential abdominal images were obtained following intravenous administration of Tc-48m labeled red blood cells. RADIOPHARMACEUTICALS:  26.3 mCi Tc-36m pertechnetate in-vitro labeled red cells. COMPARISON:  CT of the abdomen and pelvis on 07/23/2018 FINDINGS: There is abnormal activity within the proximal to mid transverse colon, leading to intraluminal activity within the distal transverse colon, splenic flexure, and proximal descending colon. Expected blood pool activity elsewhere. IMPRESSION: Most likely  site of bleeding is within the proximal to mid transverse colon. These results were called by telephone at the time of interpretation on 07/25/2018 at 1:32 pm to Dr. Watt Climes, who verbally acknowledged these results. The findings were discussed with Dr. Earleen Newport at 13:54. Electronically Signed   By: Nolon Nations M.D.   On: 07/25/2018 13:57    Scheduled Meds: . sodium chloride   Intravenous Once  . divalproex  125 mg Oral Daily  . divalproex  250 mg Oral QHS  . ferrous sulfate  325 mg Oral Q breakfast  . gabapentin  400 mg Oral QHS  . hydrocortisone  25 mg Rectal BID  . insulin aspart  0-9 Units Subcutaneous Q4H  . pantoprazole (PROTONIX) IV  40 mg Intravenous Q12H  . rOPINIRole  1 mg Oral BID  . traZODone  50 mg Oral QHS   Continuous Infusions: . sodium chloride 50 mL/hr at 07/27/18 0245     LOS: 3 days   Kerney Elbe, DO [facility] PAGER is on AMION  If 7PM-7AM, please contact night-coverage [email or web address] Password [email or web address] 07/27/2018, 11:11 AM

## 2018-07-27 NOTE — Plan of Care (Signed)

## 2018-07-27 NOTE — Progress Notes (Signed)
Patient passed another larger amount of blood with clots. Will notify on call provider. Patient asymptomatic.

## 2018-07-27 NOTE — Progress Notes (Signed)
Subjective: No further blood in stool. No abdominal pain.  Objective: Vital signs in last 24 hours: Temp:  [97.6 F (36.4 C)-98.8 F (37.1 C)] 98.8 F (37.1 C) (03/15 1355) Pulse Rate:  [67-93] 76 (03/15 1315) Resp:  [16-20] 18 (03/15 1355) BP: (120-174)/(45-78) 163/53 (03/15 1355) SpO2:  [94 %-97 %] 97 % (03/15 1355) Weight:  [57.5 kg] 57.5 kg (03/15 0600) Weight change: -2 kg Last BM Date: 07/25/18  PE: GEN:  Elderly, NAD ABD:  Soft, non-tender  Lab Results: CBC    Component Value Date/Time   WBC 8.0 07/27/2018 0247   RBC 2.41 (L) 07/27/2018 0247   HGB 7.2 (L) 07/27/2018 0828   HCT 23.2 (L) 07/27/2018 0828   PLT 221 07/27/2018 0247   MCV 95.4 07/27/2018 0247   MCH 31.1 07/27/2018 0247   MCHC 32.6 07/27/2018 0247   RDW 19.6 (H) 07/27/2018 0247   LYMPHSABS 2.3 07/27/2018 0247   MONOABS 0.8 07/27/2018 0247   EOSABS 0.3 07/27/2018 0247   BASOSABS 0.0 07/27/2018 0247    Assessment:  1.  Lower GI bleeding with positive nuclear scan (subsequent negative angiogram), suspect diverticular, resolved at this time. 2.  Acute blood loss anemia.  Interval decrease in Hgb overnight, suspect equilibrative changes, doubt ongoing GI bleeding. 3.  History stroke, reportedly not on anticoagulants.  Plan:  1.  Advance diet as tolerated. 2.  Follow CBC. 3.  No plans for further Gi testing at the present time. 4.  [facility] will sign-off; please call with questions; thank you for the consultation.   Landry Dyke 07/27/2018, 2:42 PM   Cell [phone] If no answer or after 5 PM call [phone]

## 2018-07-27 NOTE — Progress Notes (Signed)
Order received to administer platelets and plasma.

## 2018-07-27 NOTE — Progress Notes (Addendum)
Pt passing large amount of blood clots from rectum. Pt is asymptomatic. VS within normal limits. MD made aware.    Received new orders for blood transfusion and STAT labs. Will continue to monitor.

## 2018-07-28 ENCOUNTER — Encounter (HOSPITAL_COMMUNITY): Payer: Self-pay | Admitting: Interventional Radiology

## 2018-07-28 DIAGNOSIS — Z7189 Other specified counseling: Secondary | ICD-10-CM

## 2018-07-28 DIAGNOSIS — Z515 Encounter for palliative care: Secondary | ICD-10-CM

## 2018-07-28 LAB — PREPARE PLATELET PHERESIS: Unit division: 0

## 2018-07-28 LAB — CBC WITH DIFFERENTIAL/PLATELET
Abs Immature Granulocytes: 0.04 10*3/uL (ref 0.00–0.07)
Abs Immature Granulocytes: 0.05 10*3/uL (ref 0.00–0.07)
Abs Immature Granulocytes: 0.07 10*3/uL (ref 0.00–0.07)
Basophils Absolute: 0 10*3/uL (ref 0.0–0.1)
Basophils Absolute: 0 10*3/uL (ref 0.0–0.1)
Basophils Absolute: 0 10*3/uL (ref 0.0–0.1)
Basophils Relative: 0 %
Basophils Relative: 0 %
Basophils Relative: 0 %
Eosinophils Absolute: 0.3 10*3/uL (ref 0.0–0.5)
Eosinophils Absolute: 0.2 10*3/uL (ref 0.0–0.5)
Eosinophils Absolute: 0.2 10*3/uL (ref 0.0–0.5)
Eosinophils Relative: 5 %
Eosinophils Relative: 4 %
Eosinophils Relative: 3 %
HCT: 22.7 % — ABNORMAL LOW (ref 36.0–46.0)
HCT: 30.1 % — ABNORMAL LOW (ref 36.0–46.0)
HEMATOCRIT: 18 % — AB (ref 36.0–46.0)
Hemoglobin: 5.8 g/dL — CL (ref 12.0–15.0)
Hemoglobin: 7.5 g/dL — ABNORMAL LOW (ref 12.0–15.0)
Hemoglobin: 10.1 g/dL — ABNORMAL LOW (ref 12.0–15.0)
Immature Granulocytes: 1 %
Immature Granulocytes: 1 %
Immature Granulocytes: 1 %
LYMPHS ABS: 1.3 10*3/uL (ref 0.7–4.0)
Lymphocytes Relative: 23 %
Lymphocytes Relative: 18 %
Lymphocytes Relative: 19 %
Lymphs Abs: 0.9 10*3/uL (ref 0.7–4.0)
Lymphs Abs: 1.5 10*3/uL (ref 0.7–4.0)
MCH: 30.4 pg (ref 26.0–34.0)
MCH: 29.5 pg (ref 26.0–34.0)
MCH: 29.6 pg (ref 26.0–34.0)
MCHC: 32.2 g/dL (ref 30.0–36.0)
MCHC: 33 g/dL (ref 30.0–36.0)
MCHC: 33.6 g/dL (ref 30.0–36.0)
MCV: 94.2 fL (ref 80.0–100.0)
MCV: 89.4 fL (ref 80.0–100.0)
MCV: 88.3 fL (ref 80.0–100.0)
Monocytes Absolute: 0.5 10*3/uL (ref 0.1–1.0)
Monocytes Absolute: 0.5 10*3/uL (ref 0.1–1.0)
Monocytes Absolute: 0.7 10*3/uL (ref 0.1–1.0)
Monocytes Relative: 9 %
Monocytes Relative: 9 %
Monocytes Relative: 9 %
Neutro Abs: 3.3 10*3/uL (ref 1.7–7.7)
Neutro Abs: 3.4 10*3/uL (ref 1.7–7.7)
Neutro Abs: 5.2 10*3/uL (ref 1.7–7.7)
Neutrophils Relative %: 62 %
Neutrophils Relative %: 68 %
Neutrophils Relative %: 68 %
Platelets: 258 10*3/uL (ref 150–400)
Platelets: 184 10*3/uL (ref 150–400)
Platelets: 258 10*3/uL (ref 150–400)
RBC: 1.91 MIL/uL — ABNORMAL LOW (ref 3.87–5.11)
RBC: 2.54 MIL/uL — ABNORMAL LOW (ref 3.87–5.11)
RBC: 3.41 MIL/uL — ABNORMAL LOW (ref 3.87–5.11)
RDW: 18.7 % — AB (ref 11.5–15.5)
RDW: 16.4 % — ABNORMAL HIGH (ref 11.5–15.5)
RDW: 16.7 % — ABNORMAL HIGH (ref 11.5–15.5)
WBC: 5.4 10*3/uL (ref 4.0–10.5)
WBC: 5.1 10*3/uL (ref 4.0–10.5)
WBC: 7.6 10*3/uL (ref 4.0–10.5)
nRBC: 0 % (ref 0.0–0.2)
nRBC: 0 % (ref 0.0–0.2)
nRBC: 0 % (ref 0.0–0.2)

## 2018-07-28 LAB — COMPREHENSIVE METABOLIC PANEL
ALT: 17 U/L (ref 0–44)
AST: 15 U/L (ref 15–41)
Albumin: 2.9 g/dL — ABNORMAL LOW (ref 3.5–5.0)
Alkaline Phosphatase: 51 U/L (ref 38–126)
Anion gap: 7 (ref 5–15)
BUN: 42 mg/dL — ABNORMAL HIGH (ref 8–23)
CO2: 26 mmol/L (ref 22–32)
CREATININE: 2.99 mg/dL — AB (ref 0.44–1.00)
Calcium: 9.2 mg/dL (ref 8.9–10.3)
Chloride: 105 mmol/L (ref 98–111)
GFR calc Af Amer: 16 mL/min — ABNORMAL LOW (ref >60)
GFR calc non Af Amer: 13 mL/min — ABNORMAL LOW (ref >60)
Glucose, Bld: 140 mg/dL — ABNORMAL HIGH (ref 70–99)
Potassium: 3.9 mmol/L (ref 3.5–5.1)
SODIUM: 138 mmol/L (ref 135–145)
Total Bilirubin: 0.8 mg/dL (ref 0.3–1.2)
Total Protein: 5.8 g/dL — ABNORMAL LOW (ref 6.5–8.1)

## 2018-07-28 LAB — BPAM PLATELET PHERESIS
Blood Product Expiration Date: 202003152359
ISSUE DATE / TIME: 202003152204
Unit Type and Rh: 5100

## 2018-07-28 LAB — GLUCOSE, CAPILLARY
Glucose-Capillary: 131 mg/dL — ABNORMAL HIGH (ref 70–99)
Glucose-Capillary: 152 mg/dL — ABNORMAL HIGH (ref 70–99)
Glucose-Capillary: 183 mg/dL — ABNORMAL HIGH (ref 70–99)
Glucose-Capillary: 200 mg/dL — ABNORMAL HIGH (ref 70–99)
Glucose-Capillary: 214 mg/dL — ABNORMAL HIGH (ref 70–99)
Glucose-Capillary: 269 mg/dL — ABNORMAL HIGH (ref 70–99)

## 2018-07-28 LAB — MAGNESIUM: Magnesium: 1.8 mg/dL (ref 1.7–2.4)

## 2018-07-28 LAB — PHOSPHORUS: Phosphorus: 3.9 mg/dL (ref 2.5–4.6)

## 2018-07-28 LAB — PREPARE RBC (CROSSMATCH)

## 2018-07-28 MED ORDER — SODIUM CHLORIDE 0.9% IV SOLUTION
Freq: Once | INTRAVENOUS | Status: AC
  Administered 2018-07-28: 09:00:00 via INTRAVENOUS

## 2018-07-28 NOTE — Progress Notes (Signed)
Supervising Physician: Aletta Edouard  Patient Status:  [name] - In-pt  Chief Complaint: GI bleed  Subjective: Does not remember having bloody bowel movements last night or this morning.  Lying on a clean pad this afternoon.  Per RN, no bleeding since this AM.   Allergies: Penicillins  Medications: Prior to Admission medications   Medication Sig Start Date End Date Taking? Authorizing Provider  acetaminophen (TYLENOL) 325 MG tablet Take 650 mg by mouth See admin instructions. Take 650 mg by mouth three times a day and 650 mg every 8 hours as needed for pain   Yes [provider]  albuterol (PROVENTIL) (2.5 MG/3ML) 0.083% nebulizer solution Take 2.5 mg by nebulization every 2 (two) hours as needed for wheezing.   Yes [provider]  allopurinol (ZYLOPRIM) 300 MG tablet Take 150 mg by mouth at bedtime.   Yes [provider]  bisoprolol (ZEBETA) 10 MG tablet Take 1 tablet (10 mg total) by mouth daily. 07/23/13  Yes Josetta Huddle, MD  Carboxymethylcellulose Sod PF 0.5 % SOLN Place 1 drop into both eyes 2 (two) times daily.   Yes [provider]  Cholecalciferol (VITAMIN D3) 50 MCG (2000 UT) TABS Take 8,000 Units by mouth daily.   Yes [provider]  divalproex (DEPAKOTE SPRINKLE) 125 MG capsule 250 mg every morning and 125 mg in the afternoon Patient taking differently: Take 125-250 mg by mouth See admin instructions. Take 125 mg by mouth at noontime and 250 mg at bedtime 11/15/17  Yes Emokpae, Courage, MD  divalproex (DEPAKOTE) 125 MG DR tablet Take 250 mg by mouth daily after breakfast.   Yes [provider]  ferrous sulfate 325 (65 FE) MG tablet Take 325 mg by mouth daily with breakfast.   Yes [provider]  FLUoxetine (PROZAC) 20 MG tablet Take 30 mg by mouth daily.   Yes [provider]  gabapentin (NEURONTIN) 100 MG capsule Take 200 mg by mouth 2 (two) times daily.   Yes [provider]  gabapentin  (NEURONTIN) 400 MG capsule Take 400 mg by mouth at bedtime.   Yes [provider]  hydrALAZINE (APRESOLINE) 25 MG tablet Take 1 tablet (25 mg total) by mouth 3 (three) times daily. Patient taking differently: Take 25 mg by mouth 4 (four) times daily.  11/15/17  Yes Emokpae, Courage, MD  hydrocortisone (ANUSOL-HC) 25 MG suppository Place 25 mg rectally 2 (two) times daily. 07/23/18 07/29/18 Yes [provider]  insulin aspart (NOVOLOG) 100 UNIT/ML injection Inject 0-15 Units into the skin 3 (three) times daily with meals. Patient taking differently: Inject 0-12 Units into the skin See admin instructions. Inject 0-12 units into the skin 2 times a day, per sliding scale: BGL 60-150 = 0 unit(s); 151-200 = 2 units; 201-250 = 6 units; 251-300 = 8 units; 301-350 = 10 units; 351-400 = 12 units; [phone] = CALL MD 11/15/17  Yes Emokpae, Courage, MD  Insulin Glargine (BASAGLAR KWIKPEN) 100 UNIT/ML SOPN Inject 55 Units into the skin at bedtime.   Yes [provider]  linagliptin (TRADJENTA) 5 MG TABS tablet Take 5 mg by mouth daily.   Yes [provider]  magnesium oxide (MAG-OX) 400 MG tablet Take 400 mg by mouth daily.   Yes [provider]  NON FORMULARY Take 120 mLs by mouth See admin instructions. MedPass; Drink 120 ml's by mouth once a day   Yes [provider]  Olopatadine HCl 0.2 % SOLN Place 1 drop into  both eyes daily. 07/08/17  Yes [provider]  OXYGEN Inhale 2 L into the lungs as needed (FOR SATS <90%).    Yes [provider]  pantoprazole (PROTONIX) 20 MG tablet Take 20 mg by mouth 2 (two) times daily.  07/04/17  Yes [provider]  rOPINIRole (REQUIP) 1 MG tablet Take 1 mg by mouth 2 (two) times daily.   Yes [provider]  torsemide (DEMADEX) 20 MG tablet Take 2 tablets (40 mg total) by mouth daily. 07/23/17  Yes Eugenie Filler, MD  traMADol (ULTRAM) 50 MG tablet Take 0.5 tablets (25 mg total) by mouth every  12 (twelve) hours as needed for moderate pain. Patient taking differently: Take 25 mg by mouth every 12 (twelve) hours as needed (for pain).  11/15/17  Yes Emokpae, Courage, MD  vitamin B-12 (CYANOCOBALAMIN) 1000 MCG tablet Take 1,000 mcg by mouth daily.   Yes [provider]  Dextromethorphan-guaiFENesin 10-100 MG/5ML liquid Take 10 mLs by mouth every 4 (four) hours as needed (cough).    [provider]  fluticasone (FLONASE) 50 MCG/ACT nasal spray Place 1 spray into both nostrils daily.    [provider]  ipratropium-albuterol (DUONEB) 0.5-2.5 (3) MG/3ML SOLN Take 3 mLs by nebulization 3 (three) times daily. Patient not taking: Reported on 07/23/2018 11/15/17   Roxan Hockey, MD  LEVEMIR FLEXTOUCH 100 UNIT/ML Pen Inject 45 Units into the skin daily. 10/18/17   [provider]  Menthol, Topical Analgesic, (BIOFREEZE ROLL-ON) 4 % GEL Apply 1 application topically 2 (two) times daily.    [provider]  predniSONE (DELTASONE) 20 MG tablet Take 1 tablet (20 mg total) by mouth daily with breakfast. Patient not taking: Reported on 07/23/2018 11/15/17   Roxan Hockey, MD  PRESCRIPTION MEDICATION Apply 1 application topically every Monday. selsum blue shampoo for itchy/flaky scalp    [provider]  senna-docusate (SENOKOT-S) 8.6-50 MG tablet Take 2 tablets by mouth at bedtime. Patient not taking: Reported on 07/23/2018 11/15/17 11/15/18  Roxan Hockey, MD  simethicone (MYLICON) 80 MG chewable tablet Chew 80 mg by mouth 3 (three) times daily as needed for flatulence (and bloating).    [provider]  sodium chloride (DEEP SEA NASAL SPRAY) 0.65 % nasal spray Place 1 spray into the nose 4 (four) times daily as needed for congestion.     [provider]  tetrahydrozoline 0.05 % ophthalmic solution Place 2 drops into both eyes 2 (two) times daily.    [provider]  traZODone (DESYREL) 50 MG tablet Take 50 mg by mouth at bedtime.  06/25/17   [provider]     Vital Signs: BP (!) 169/82    Pulse 87    Temp 98.5 F (36.9 C) (Oral)    Resp 18    Ht 5' (1.524 m)    Wt 133 lb 9.6 oz (60.6 kg)    SpO2 98%    BMI 26.09 kg/m   Physical Exam  NAD, alert, lying in bed Abdomen: soft, non-tender.  No evidence of bleeding or bright red blood per rectum   Imaging: Nm Gi Blood Loss  Result Date: 07/25/2018 CLINICAL DATA:  Active rectal bleed/ eval for bleeding origin. EXAM: NUCLEAR MEDICINE GASTROINTESTINAL BLEEDING SCAN TECHNIQUE: Sequential abdominal images were obtained following intravenous administration of Tc-35m labeled red blood cells. RADIOPHARMACEUTICALS:  26.3 mCi Tc-62m pertechnetate in-vitro labeled red cells. COMPARISON:  CT of the abdomen and pelvis on 07/23/2018 FINDINGS: There is abnormal activity within the proximal  to mid transverse colon, leading to intraluminal activity within the distal transverse colon, splenic flexure, and proximal descending colon. Expected blood pool activity elsewhere. IMPRESSION: Most likely site of bleeding is within the proximal to mid transverse colon. These results were called by telephone at the time of interpretation on 07/25/2018 at 1:32 pm to Dr. Watt Climes, who verbally acknowledged these results. The findings were discussed with Dr. Earleen Newport at 13:54. Electronically Signed   By: Nolon Nations M.D.   On: 07/25/2018 13:57   Ir Angiogram Visceral Selective  Result Date: 07/28/2018 INDICATION: 85 year old female with a history positive GI bleeding study, anemia, bright red blood per rectum/melena. EXAM: ULTRASOUND GUIDED ACCESS RIGHT COMMON FEMORAL ARTERY MESENTERIC ANGIOGRAM MEDICATIONS: None ANESTHESIA/SEDATION: Moderate (conscious) sedation was not employed during this procedure. A total of Versed 0.5 mg and Fentanyl 0 mcg was administered intravenously. Moderate Sedation Time: 0 minutes. The patient's level of consciousness and vital signs were monitored continuously by  radiology nursing throughout the procedure under my direct supervision. CONTRAST:  60 cc FLUOROSCOPY TIME:  Fluoroscopy Time: 11 minutes 54 seconds (204 mGy). COMPLICATIONS: None PROCEDURE: Informed consent was obtained from the patient following explanation of the procedure, risks, benefits and alternatives. The patient understands, agrees and consents for the procedure. All questions were addressed. A time out was performed prior to the initiation of the procedure. Maximal barrier sterile technique utilized including caps, mask, sterile gowns, sterile gloves, large sterile drape, hand hygiene, and Betadine prep. Ultrasound survey of the right inguinal region was performed with images stored and sent to PACs, confirming patency of the vessel. A micropuncture needle was used access the right common femoral artery under ultrasound. With excellent arterial blood flow returned, and an .018 micro wire was passed through the needle, observed enter the abdominal aorta under fluoroscopy. The needle was removed, and a micropuncture sheath was placed over the wire. The inner dilator and wire were removed, and an 035 Bentson wire was advanced under fluoroscopy into the abdominal aorta. The sheath was removed and a standard 5 [place] vascular sheath was placed. The dilator was removed and the sheath was flushed. C2 cobra catheter was advanced on the Bentson wire into the abdominal aorta. SMA was selected. Angiogram was performed. C2 catheter was then used in attempt to select the inferior mesenteric artery origin. Unfavorable atherosclerotic changes, caliber of aorta, and tortuosity. The C2 catheter was then exchanged for a rim catheter. This was unsuccessful in selecting the IMA origin. Chung catheter was then exchanged, formed in the thoracic aorta, and advanced inferiorly. We found success with engaging the IMA origin with the chung catheter. Angiogram was performed. The chung catheter was then advanced to the celiac artery  origin and a final angiogram was performed at the celiac artery origin. After all images were thoroughly reviewed, we withdrew having discovered no evidence hemorrhage for embolization. We elected not to empirically embolize any of the branches to the transverse colon. Exoseal was deployed at the right common femoral artery. Patient tolerated the procedure well and remained hemodynamically stable throughout. No complications were encountered and no significant blood loss. FINDINGS: Ultrasound demonstrates patent right common femoral artery Extensive atherosclerotic changes of the abdominal aorta and the bilateral iliac arteries. No aneurysm. 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 There is adequate filling of the arcade branches, ileo colic artery, with no pooling of contrast, extravasation, tumor blush, or significant angio dysplasia.  A spot image which was acquired directly following the injection of the SMA demonstrates no puddling of contrast. IMA angiogram demonstrates significant atherosclerotic changes in the distribution of the superior hemorrhoidal artery. No pooling of contrast, extravasation, tumor blush, or significant angio dysplasia. A spot image acquired directly following the injection of the SMA demonstrates filling of the inferior mesenteric vein, with no puddling of contrast/contrast stain. Angiogram of the celiac artery demonstrates patent celiac with patent branches including splenic artery, left gastric artery, common hepatic artery. GDA is patent, and is continuous with the gastroepiploic artery. No evidence active extravasation, pooling of contrast, tumor blush. IMPRESSION: Status post ultrasound-guided right common femoral artery angiogram and complete mesenteric angiogram, with no findings of gastrointestinal bleeding, and no findings that would warrant empiric embolization of  the lower GI tract. Signed, [name]. Dellia Nims, RPVI [facility] Radiology PLAN: If the patient has continued bleeding, surgical consultation may be warranted. If the patient is not a surgical candidate and there is ongoing bleeding, repeat mesenteric angiogram may be considered, although this would require more contrast administration and put the patient had significant risk for acute renal failure/contrast induced nephropathy. A risk benefit analysis including consideration of palliative care may be considered at that time. Additionally if the patient has ongoing bleeding, a repeat nuclear study may be considered. Electronically Signed   By: [name] D.O.   On: 07/28/2018 08:33   Ir Angiogram Visceral Selective  Result Date: 07/28/2018 INDICATION: 85 year old female with a history positive GI bleeding study, anemia, bright red blood per rectum/melena. EXAM: ULTRASOUND GUIDED ACCESS RIGHT COMMON FEMORAL ARTERY MESENTERIC ANGIOGRAM MEDICATIONS: None ANESTHESIA/SEDATION: Moderate (conscious) sedation was not employed during this procedure. A total of Versed 0.5 mg and Fentanyl 0 mcg was administered intravenously. Moderate Sedation Time: 0 minutes. The patient's level of consciousness and vital signs were monitored continuously by radiology nursing throughout the procedure under my direct supervision. CONTRAST:  60 cc FLUOROSCOPY TIME:  Fluoroscopy Time: 11 minutes 54 seconds (204 mGy). COMPLICATIONS: None PROCEDURE: Informed consent was obtained from the patient following explanation of the procedure, risks, benefits and alternatives. The patient understands, agrees and consents for the procedure. All questions were addressed. A time out was performed prior to the initiation of the procedure. Maximal barrier sterile technique utilized including caps, mask, sterile gowns, sterile gloves, large sterile drape, hand hygiene, and Betadine prep. Ultrasound survey  of the right inguinal region was performed with images stored and sent to PACs, confirming patency of the vessel. A micropuncture needle was used access the right common femoral artery under ultrasound. With excellent arterial blood flow returned, and an .018 micro wire was passed through the needle, observed enter the abdominal aorta under fluoroscopy. The needle was removed, and a micropuncture sheath was placed over the wire. The inner dilator and wire were removed, and an 035 Bentson wire was advanced under fluoroscopy into the abdominal aorta. The sheath was removed and a standard 5 [place] vascular sheath was placed. The dilator was removed and the sheath was flushed. C2 cobra catheter was advanced on the Bentson wire into the abdominal aorta. SMA was selected. Angiogram was performed. C2 catheter was then used in attempt to select the inferior mesenteric artery origin. Unfavorable atherosclerotic changes, caliber of aorta, and tortuosity. The C2 catheter was then exchanged for a rim catheter. This was unsuccessful in selecting the IMA origin. Chung catheter was then exchanged, formed in the thoracic aorta, and advanced inferiorly. We found success  with engaging the IMA origin with the chung catheter. Angiogram was performed. The chung catheter was then advanced to the celiac artery origin and a final angiogram was performed at the celiac artery origin. After all images were thoroughly reviewed, we withdrew having discovered no evidence hemorrhage for embolization. We elected not to empirically embolize any of the branches to the transverse colon. Exoseal was deployed at the right common femoral artery. Patient tolerated the procedure well and remained hemodynamically stable throughout. No complications were encountered and no significant blood loss. FINDINGS: Ultrasound demonstrates patent right common femoral artery Extensive atherosclerotic changes of the abdominal aorta and the bilateral iliac arteries. No  aneurysm. 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 There is adequate filling of the arcade branches, ileo colic artery, with no pooling of contrast, extravasation, tumor blush, or significant angio dysplasia. A spot image which was acquired directly following the injection of the SMA demonstrates no puddling of contrast. IMA angiogram demonstrates significant atherosclerotic changes in the distribution of the superior hemorrhoidal artery. No pooling of contrast, extravasation, tumor blush, or significant angio dysplasia. A spot image acquired directly following the injection of the SMA demonstrates filling of the inferior mesenteric vein, with no puddling of contrast/contrast stain. Angiogram of the celiac artery demonstrates patent celiac with patent branches including splenic artery, left gastric artery, common hepatic artery. GDA is patent, and is continuous with the gastroepiploic artery. No evidence active extravasation, pooling of contrast, tumor blush. IMPRESSION: Status post ultrasound-guided right common femoral artery angiogram and complete mesenteric angiogram, with no findings of gastrointestinal bleeding, and no findings that would warrant empiric embolization of the lower GI tract. Signed, [name]. Dellia Nims, RPVI [facility] Radiology [facility] Radiology PLAN: If the patient has continued bleeding, surgical consultation may be warranted. If the patient is not a surgical candidate and there is ongoing bleeding, repeat mesenteric angiogram may be considered, although this would require more contrast administration and put the patient had significant risk for acute renal failure/contrast induced nephropathy. A risk benefit analysis including consideration of palliative care may be considered at that time. Additionally if the patient has ongoing bleeding, a repeat  nuclear study may be considered. Electronically Signed   By: [name] D.O.   On: 07/28/2018 08:33   Ir Angiogram Visceral Selective  Result Date: 07/28/2018 INDICATION: 85 year old female with a history positive GI bleeding study, anemia, bright red blood per rectum/melena. EXAM: ULTRASOUND GUIDED ACCESS RIGHT COMMON FEMORAL ARTERY MESENTERIC ANGIOGRAM MEDICATIONS: None ANESTHESIA/SEDATION: Moderate (conscious) sedation was not employed during this procedure. A total of Versed 0.5 mg and Fentanyl 0 mcg was administered intravenously. Moderate Sedation Time: 0 minutes. The patient's level of consciousness and vital signs were monitored continuously by radiology nursing throughout the procedure under my direct supervision. CONTRAST:  60 cc FLUOROSCOPY TIME:  Fluoroscopy Time: 11 minutes 54 seconds (204 mGy). COMPLICATIONS: None PROCEDURE: Informed consent was obtained from the patient following explanation of the procedure, risks, benefits and alternatives. The patient understands, agrees and consents for the procedure. All questions were addressed. A time out was performed prior to the initiation of the procedure. Maximal barrier sterile technique utilized including caps, mask, sterile gowns, sterile gloves, large sterile drape, hand hygiene, and Betadine prep. Ultrasound survey of the right inguinal region was performed with images stored and sent to PACs, confirming patency of the vessel. A micropuncture needle was used access  the right common femoral artery under ultrasound. With excellent arterial blood flow returned, and an .018 micro wire was passed through the needle, observed enter the abdominal aorta under fluoroscopy. The needle was removed, and a micropuncture sheath was placed over the wire. The inner dilator and wire were removed, and an 035 Bentson wire was advanced under fluoroscopy into the abdominal aorta. The sheath was removed and a standard 5 [place] vascular sheath was placed. The dilator  was removed and the sheath was flushed. C2 cobra catheter was advanced on the Bentson wire into the abdominal aorta. SMA was selected. Angiogram was performed. C2 catheter was then used in attempt to select the inferior mesenteric artery origin. Unfavorable atherosclerotic changes, caliber of aorta, and tortuosity. The C2 catheter was then exchanged for a rim catheter. This was unsuccessful in selecting the IMA origin. Chung catheter was then exchanged, formed in the thoracic aorta, and advanced inferiorly. We found success with engaging the IMA origin with the chung catheter. Angiogram was performed. The chung catheter was then advanced to the celiac artery origin and a final angiogram was performed at the celiac artery origin. After all images were thoroughly reviewed, we withdrew having discovered no evidence hemorrhage for embolization. We elected not to empirically embolize any of the branches to the transverse colon. Exoseal was deployed at the right common femoral artery. Patient tolerated the procedure well and remained hemodynamically stable throughout. No complications were encountered and no significant blood loss. FINDINGS: Ultrasound demonstrates patent right common femoral artery Extensive atherosclerotic changes of the abdominal aorta and the bilateral iliac arteries. No aneurysm. 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 There is adequate filling of the arcade branches, ileo colic artery, with no pooling of contrast, extravasation, tumor blush, or significant angio dysplasia. A spot image which was acquired directly following the injection of the SMA demonstrates no puddling of contrast. IMA angiogram demonstrates significant atherosclerotic changes in the distribution of the superior hemorrhoidal artery. No pooling of contrast, extravasation, tumor blush, or significant angio dysplasia. A  spot image acquired directly following the injection of the SMA demonstrates filling of the inferior mesenteric vein, with no puddling of contrast/contrast stain. Angiogram of the celiac artery demonstrates patent celiac with patent branches including splenic artery, left gastric artery, common hepatic artery. GDA is patent, and is continuous with the gastroepiploic artery. No evidence active extravasation, pooling of contrast, tumor blush. IMPRESSION: Status post ultrasound-guided right common femoral artery angiogram and complete mesenteric angiogram, with no findings of gastrointestinal bleeding, and no findings that would warrant empiric embolization of the lower GI tract. Signed, [name]. Dellia Nims, RPVI [facility] Radiology Special[facility] Radiology PLAN: If the patient has continued bleeding, surgical consultation may be warranted. If the patient is not a surgical candidate and there is ongoing bleeding, repeat mesenteric angiogram may be considered, although this would require more contrast administration and put the patient had significant risk for acute renal failure/contrast induced nephropathy. A risk benefit analysis including consideration of palliative care may be considered at that time. Additionally if the patient has ongoing bleeding, a repeat nuclear study may be considered. Electronically Signed   By: [name] D.O.   On: 07/28/2018 08:33   Ir US Guide Vasc Access Right  Result Date: 07/28/2018 INDICATION: 85 year old female with a history positive GI bleeding study, anemia, bright red blood per rectum/melena. EXAM: ULTRASOUND GUIDED ACCESS RIGHT  COMMON FEMORAL ARTERY MESENTERIC ANGIOGRAM MEDICATIONS: None ANESTHESIA/SEDATION: Moderate (conscious) sedation was not employed during this procedure. A total of Versed 0.5 mg and Fentanyl 0 mcg was administered intravenously. Moderate Sedation Time: 0 minutes. The patient's level of consciousness and vital signs were  monitored continuously by radiology nursing throughout the procedure under my direct supervision. CONTRAST:  60 cc FLUOROSCOPY TIME:  Fluoroscopy Time: 11 minutes 54 seconds (204 mGy). COMPLICATIONS: None PROCEDURE: Informed consent was obtained from the patient following explanation of the procedure, risks, benefits and alternatives. The patient understands, agrees and consents for the procedure. All questions were addressed. A time out was performed prior to the initiation of the procedure. Maximal barrier sterile technique utilized including caps, mask, sterile gowns, sterile gloves, large sterile drape, hand hygiene, and Betadine prep. Ultrasound survey of the right inguinal region was performed with images stored and sent to PACs, confirming patency of the vessel. A micropuncture needle was used access the right common femoral artery under ultrasound. With excellent arterial blood flow returned, and an .018 micro wire was passed through the needle, observed enter the abdominal aorta under fluoroscopy. The needle was removed, and a micropuncture sheath was placed over the wire. The inner dilator and wire were removed, and an 035 Bentson wire was advanced under fluoroscopy into the abdominal aorta. The sheath was removed and a standard 5 [place] vascular sheath was placed. The dilator was removed and the sheath was flushed. C2 cobra catheter was advanced on the Bentson wire into the abdominal aorta. SMA was selected. Angiogram was performed. C2 catheter was then used in attempt to select the inferior mesenteric artery origin. Unfavorable atherosclerotic changes, caliber of aorta, and tortuosity. The C2 catheter was then exchanged for a rim catheter. This was unsuccessful in selecting the IMA origin. Chung catheter was then exchanged, formed in the thoracic aorta, and advanced inferiorly. We found success with engaging the IMA origin with the chung catheter. Angiogram was performed. The chung catheter was then  advanced to the celiac artery origin and a final angiogram was performed at the celiac artery origin. After all images were thoroughly reviewed, we withdrew having discovered no evidence hemorrhage for embolization. We elected not to empirically embolize any of the branches to the transverse colon. Exoseal was deployed at the right common femoral artery. Patient tolerated the procedure well and remained hemodynamically stable throughout. No complications were encountered and no significant blood loss. FINDINGS: Ultrasound demonstrates patent right common femoral artery Extensive atherosclerotic changes of the abdominal aorta and the bilateral iliac arteries. No aneurysm. 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 There is adequate filling of the arcade branches, ileo colic artery, with no pooling of contrast, extravasation, tumor blush, or significant angio dysplasia. A spot image which was acquired directly following the injection of the SMA demonstrates no puddling of contrast. IMA angiogram demonstrates significant atherosclerotic changes in the distribution of the superior hemorrhoidal artery. No pooling of contrast, extravasation, tumor blush, or significant angio dysplasia. A spot image acquired directly following the injection of the SMA demonstrates filling of the inferior mesenteric vein, with no puddling of contrast/contrast stain. Angiogram of the celiac artery demonstrates patent celiac with patent branches including splenic artery, left gastric artery, common hepatic artery. GDA is patent, and is continuous with the gastroepiploic artery. No evidence active extravasation, pooling of contrast, tumor blush. IMPRESSION: Status post ultrasound-guided right common femoral artery angiogram and  complete mesenteric angiogram, with no findings of gastrointestinal bleeding, and no findings that would  warrant empiric embolization of the lower GI tract. Signed, [name]. Dellia Nims, RPVI [facility] Radiology PLAN: If the patient has continued bleeding, surgical consultation may be warranted. If the patient is not a surgical candidate and there is ongoing bleeding, repeat mesenteric angiogram may be considered, although this would require more contrast administration and put the patient had significant risk for acute renal failure/contrast induced nephropathy. A risk benefit analysis including consideration of palliative care may be considered at that time. Additionally if the patient has ongoing bleeding, a repeat nuclear study may be considered. Electronically Signed   By: [name] D.O.   On: 07/28/2018 08:33    Labs:  CBC: Recent Labs    07/27/18 0247 07/27/18 0828 07/27/18 1754 07/27/18 1936 07/28/18 0730  WBC 8.0  --  8.0 9.4 5.4  HGB 7.5* 7.2* 9.2* 8.5* 5.8*  HCT 23.0* 23.2* 29.3* 26.3* 18.0*  PLT 221  --  226 230 258    COAGS: Recent Labs    07/23/18 0105  INR 1.0    BMP: Recent Labs    07/25/18 0321 07/26/18 0332 07/27/18 0247 07/28/18 0730  NA 137 135 135 138  K 4.1 4.0 3.7 3.9  CL 96* 100 101 105  CO2 28 26 26 26   GLUCOSE 175* 169* 142* 140*  BUN 47* 50* 44* 42*  CALCIUM 10.1 8.9 8.9 9.2  CREATININE 2.80* 3.31* 3.17* 2.99*  GFRNONAA 15* 12* 13* 13*  GFRAA 17* 14* 15* 16*    LIVER FUNCTION TESTS: Recent Labs    11/14/17 1205 07/26/18 0332 07/27/18 0247 07/28/18 0730  BILITOT 0.5 1.9* 0.9 0.8  AST 14* 17 13* 15  ALT 12 21 17 17   ALKPHOS 35* 47 51 51  PROT 6.8 5.4* 5.4* 5.8*  ALBUMIN 3.3* 2.6* 2.6* 2.9*    Assessment and Plan: GI bleed Patient with ongoing intermittent BRBPR.  Her most recent episodes of bleeding were overnight and this AM, but none since.  Her Hgb decreased to 5.8. She has now received 2u PRBC today.  Upon assessment this afternoon, patient is found clean and dry.  No signs  of active bleeding at this time.   After her most recent angiogram her SCr increased from 2.8 to 3.31.  It has decreased to 2.99 today.  Discussed with Dr. Kathlene Cote. No plans for angiogram this afternoon.  Difficult situation as patient's bleeding is intermittent and she had worsening of her renal function after contrast exposure on Friday. No empiric embolization can be performed without further confirmation of bleeding source.  Palliative care is involved in meeting with patient and family.   Electronically Signed: Docia Barrier, PA 07/28/2018, 4:53 PM   I spent a total of 15 Minutes at the the patient's bedside AND on the patient's hospital floor or unit, greater than 50% of which was counseling/coordinating care for GI bleed.

## 2018-07-28 NOTE — Clinical Social Work Note (Signed)
Readmission risk screening completed. No additional resources needed at this time.  [place], [place]

## 2018-07-28 NOTE — Progress Notes (Addendum)
Subjective: The patient was seen and examined at bedise. She has has 3 bloody Bm since 6 pm yesterday evening, the first one was large as per nurse, the second was small and the third was even smaller. She has remained hemodynamically stable throughout and denies abdominal pain.  Objective: Vital signs in last 24 hours: Temp:  [97.7 F (36.5 C)-98.8 F (37.1 C)] 98 F (36.7 C) (03/16 0415) Pulse Rate:  [73-79] 73 (03/16 0415) Resp:  [17-20] 18 (03/16 0415) BP: (115-188)/(35-86) 136/44 (03/16 0604) SpO2:  [94 %-100 %] 94 % (03/16 0415) Weight:  [60.6 kg] 60.6 kg (03/16 0553) Weight change: 3.1 kg Last BM Date: 07/25/18  PE:.Marland Kitchen appears comfortable GENERAL:prominent pallor ABDOMEN:soft, mildly distended, nontender, sluggish bowel sounds EXTREMITIES:no deformity  Lab Results: Results for orders placed or performed during the hospital encounter of 07/23/18 (from the past 48 hour(s))  Hemoglobin and hematocrit, blood     Status: Abnormal   Collection Time: 07/26/18  9:13 AM  Result Value Ref Range   Hemoglobin 8.5 (L) 12.0 - 15.0 g/dL   HCT 26.5 (L) 36.0 - 46.0 %    Comment: Performed at [facility], [place] [address], [place], [place] [address]  Glucose, capillary     Status: Abnormal   Collection Time: 07/26/18 11:59 AM  Result Value Ref Range   Glucose-Capillary 192 (H) 70 - 99 mg/dL  Hemoglobin and hematocrit, blood     Status: Abnormal   Collection Time: 07/26/18  3:17 PM  Result Value Ref Range   Hemoglobin 8.6 (L) 12.0 - 15.0 g/dL   HCT 26.6 (L) 36.0 - 46.0 %    Comment: Performed at [facility], [place] [address], [place], [place] [address]  Glucose, capillary     Status: Abnormal   Collection Time: 07/26/18  4:33 PM  Result Value Ref Range   Glucose-Capillary 220 (H) 70 - 99 mg/dL  Glucose, capillary     Status: Abnormal   Collection Time: 07/26/18  8:05 PM  Result Value Ref Range   Glucose-Capillary 184 (H) 70 - 99 mg/dL  Hemoglobin and hematocrit, blood      Status: Abnormal   Collection Time: 07/26/18  9:11 PM  Result Value Ref Range   Hemoglobin 8.1 (L) 12.0 - 15.0 g/dL   HCT 25.5 (L) 36.0 - 46.0 %    Comment: Performed at [facility], [place] [address], [place], [place] [address]  Glucose, capillary     Status: Abnormal   Collection Time: 07/27/18 12:04 AM  Result Value Ref Range   Glucose-Capillary 183 (H) 70 - 99 mg/dL  CBC with Differential/Platelet     Status: Abnormal   Collection Time: 07/27/18  2:47 AM  Result Value Ref Range   WBC 8.0 4.0 - 10.5 K/uL   RBC 2.41 (L) 3.87 - 5.11 MIL/uL   Hemoglobin 7.5 (L) 12.0 - 15.0 g/dL   HCT 23.0 (L) 36.0 - 46.0 %   MCV 95.4 80.0 - 100.0 fL   MCH 31.1 26.0 - 34.0 pg   MCHC 32.6 30.0 - 36.0 g/dL   RDW 19.6 (H) 11.5 - 15.5 %   Platelets 221 150 - 400 K/uL   nRBC 0.2 0.0 - 0.2 %   Neutrophils Relative % 59 %   Neutro Abs 4.7 1.7 - 7.7 K/uL   Lymphocytes Relative 28 %   Lymphs Abs 2.3 0.7 - 4.0 K/uL   Monocytes Relative 9 %   Monocytes Absolute 0.8 0.1 -  1.0 K/uL   Eosinophils Relative 3 %   Eosinophils Absolute 0.3 0.0 - 0.5 K/uL   Basophils Relative 0 %   Basophils Absolute 0.0 0.0 - 0.1 K/uL   Immature Granulocytes 1 %   Abs Immature Granulocytes 0.05 0.00 - 0.07 K/uL    Comment: Performed at [facility], [place] [address], [place], [place] [address]  Comprehensive metabolic panel     Status: Abnormal   Collection Time: 07/27/18  2:47 AM  Result Value Ref Range   Sodium 135 135 - 145 mmol/L   Potassium 3.7 3.5 - 5.1 mmol/L   Chloride 101 98 - 111 mmol/L   CO2 26 22 - 32 mmol/L   Glucose, Bld 142 (H) 70 - 99 mg/dL   BUN 44 (H) 8 - 23 mg/dL   Creatinine, Ser 3.17 (H) 0.44 - 1.00 mg/dL   Calcium 8.9 8.9 - 10.3 mg/dL   Total Protein 5.4 (L) 6.5 - 8.1 g/dL   Albumin 2.6 (L) 3.5 - 5.0 g/dL   AST 13 (L) 15 - 41 U/L   ALT 17 0 - 44 U/L   Alkaline Phosphatase 51 38 - 126 U/L   Total Bilirubin 0.9 0.3 - 1.2 mg/dL   GFR calc non Af Amer 13 (L) >60 mL/min   GFR calc Af Amer  15 (L) >60 mL/min   Anion gap 8 5 - 15    Comment: Performed at [facility], [place] [address] [place], [place] [address]  Magnesium     Status: None   Collection Time: 07/27/18  2:47 AM  Result Value Ref Range   Magnesium 1.8 1.7 - 2.4 mg/dL    Comment: Performed at [facility] [address] [place], [place] [address]  Phosphorus     Status: None   Collection Time: 07/27/18  2:47 AM  Result Value Ref Range   Phosphorus 3.5 2.5 - 4.6 mg/dL    Comment: Performed at [place] [facility] [place] [address], [place], [place] [address]  Glucose, capillary     Status: Abnormal   Collection Time: 07/27/18  3:58 AM  Result Value Ref Range   Glucose-Capillary 125 (H) 70 - 99 mg/dL  Glucose, capillary     Status: Abnormal   Collection Time: 07/27/18  7:43 AM  Result Value Ref Range   Glucose-Capillary 123 (H) 70 - 99 mg/dL  Hemoglobin and hematocrit, blood     Status: Abnormal   Collection Time: 07/27/18  8:28 AM  Result Value Ref Range   Hemoglobin 7.2 (L) 12.0 - 15.0 g/dL   HCT 23.2 (L) 36.0 - 46.0 %    Comment: Performed at [facility], [place] [address] [place], [place] [address]  Type and screen [place]     Status: None (Preliminary result)   Collection Time: 07/27/18 12:13 PM  Result Value Ref Range   ABO/RH(D) A NEG    Antibody Screen NEG    Sample Expiration      07/30/2018 Performed at [facility], [place] [address], [place], [place] [address]    Unit Number [id number]    Blood Component Type RED CELLS,LR    Unit division 00    Status of Unit ISSUED    Transfusion Status OK TO TRANSFUSE    Crossmatch Result Compatible    Unit Number [id number]    Blood Component Type RED CELLS,LR    Unit division 00    Status of  Unit ALLOCATED    Transfusion Status OK TO TRANSFUSE    Crossmatch Result Compatible   Prepare RBC     Status: None   Collection Time: 07/27/18 12:14 PM  Result Value Ref Range   Order Confirmation      ORDER  PROCESSED BY BLOOD BANK Performed at [facility], [place] [address] [place], [place] [address]   Glucose, capillary     Status: Abnormal   Collection Time: 07/27/18 12:28 PM  Result Value Ref Range   Glucose-Capillary 269 (H) 70 - 99 mg/dL  Glucose, capillary     Status: Abnormal   Collection Time: 07/27/18  4:59 PM  Result Value Ref Range   Glucose-Capillary 227 (H) 70 - 99 mg/dL  Prepare fresh frozen plasma     Status: None (Preliminary result)   Collection Time: 07/27/18  5:33 PM  Result Value Ref Range   Unit Number [id number]    Blood Component Type THAWED PLASMA    Unit division 00    Status of Unit ISSUED    Transfusion Status      OK TO TRANSFUSE Performed at [facility] [address], [place], [place] [address]   Prepare Pheresed Platelets     Status: None (Preliminary result)   Collection Time: 07/27/18  5:33 PM  Result Value Ref Range   Unit Number [id number]    Blood Component Type PLTPHER LR1    Unit division 00    Status of Unit ISSUED    Transfusion Status      OK TO TRANSFUSE Performed at [facility] [address] [place], [place] [address]   CBC with Differential/Platelet     Status: Abnormal   Collection Time: 07/27/18  5:54 PM  Result Value Ref Range   WBC 8.0 4.0 - 10.5 K/uL   RBC 3.12 (L) 3.87 - 5.11 MIL/uL   Hemoglobin 9.2 (L) 12.0 - 15.0 g/dL    Comment: REPEATED TO VERIFY POST TRANSFUSION SPECIMEN    HCT 29.3 (L) 36.0 - 46.0 %   MCV 93.9 80.0 - 100.0 fL   MCH 29.5 26.0 - 34.0 pg   MCHC 31.4 30.0 - 36.0 g/dL   RDW 18.1 (H) 11.5 - 15.5 %   Platelets 226 150 - 400 K/uL   nRBC 0.0 0.0 - 0.2 %   Neutrophils Relative % 71 %   Neutro Abs 5.7 1.7 - 7.7 K/uL   Lymphocytes Relative 19 %   Lymphs Abs 1.5 0.7 - 4.0 K/uL   Monocytes Relative 7 %   Monocytes Absolute 0.6 0.1 - 1.0 K/uL   Eosinophils Relative 2 %   Eosinophils Absolute 0.2 0.0 - 0.5 K/uL   Basophils Relative 0 %   Basophils Absolute 0.0 0.0 - 0.1 K/uL    Immature Granulocytes 1 %   Abs Immature Granulocytes 0.05 0.00 - 0.07 K/uL    Comment: Performed at [facility], [place] [address], [place], [place] [address]  CBC with Differential/Platelet     Status: Abnormal   Collection Time: 07/27/18  7:36 PM  Result Value Ref Range   WBC 9.4 4.0 - 10.5 K/uL   RBC 2.76 (L) 3.87 - 5.11 MIL/uL   Hemoglobin 8.5 (L) 12.0 - 15.0 g/dL   HCT 26.3 (L) 36.0 - 46.0 %   MCV 95.3 80.0 - 100.0 fL   MCH 30.8 26.0 - 34.0 pg   MCHC 32.3 30.0 - 36.0 g/dL   RDW 18.2 (H)  11.5 - 15.5 %   Platelets 230 150 - 400 K/uL   nRBC 0.0 0.0 - 0.2 %   Neutrophils Relative % 65 %   Neutro Abs 6.1 1.7 - 7.7 K/uL   Lymphocytes Relative 24 %   Lymphs Abs 2.2 0.7 - 4.0 K/uL   Monocytes Relative 8 %   Monocytes Absolute 0.8 0.1 - 1.0 K/uL   Eosinophils Relative 2 %   Eosinophils Absolute 0.2 0.0 - 0.5 K/uL   Basophils Relative 0 %   Basophils Absolute 0.0 0.0 - 0.1 K/uL   Immature Granulocytes 1 %   Abs Immature Granulocytes 0.06 0.00 - 0.07 K/uL    Comment: Performed at [facility] [address] [place], [place] [address]  Glucose, capillary     Status: Abnormal   Collection Time: 07/27/18  7:49 PM  Result Value Ref Range   Glucose-Capillary 219 (H) 70 - 99 mg/dL  Glucose, capillary     Status: Abnormal   Collection Time: 07/27/18 11:43 PM  Result Value Ref Range   Glucose-Capillary 268 (H) 70 - 99 mg/dL  Glucose, capillary     Status: Abnormal   Collection Time: 07/28/18  4:00 AM  Result Value Ref Range   Glucose-Capillary 152 (H) 70 - 99 mg/dL  Glucose, capillary     Status: Abnormal   Collection Time: 07/28/18  7:11 AM  Result Value Ref Range   Glucose-Capillary 131 (H) 70 - 99 mg/dL    Studies/Results: No results found.  Medications: I have reviewed the patient's current medications.  Assessment: Presumed diverticular bleed, bleeding scan was positive- possible site: transverse colon, when patient was taken for IR guided embolization no  active bleeding noted on 07/25/2018  IR team was notified yesterday as per nursing staff, due to impaired renal function with a GFR of only 13, she remains at high risk for becoming dialysis dependent if further contrast was given for angiogram. At this point it was considered safe to monitor her and transfuse her as needed. Patient has been given 1 unit of PRBC transfusion,1 unit of platelet transfusion and 1 unit of FFP yesterday. Hemoglobin was 7.5/7.2 then posttransfusion 9.2 and 8.5  Plan: Discussed with patient's hospitalist Dr. Alfredia Ferguson, plan conservative management, monitor and transfuse as needed.   ADDENDUM: I was notified by Dr.Sheikh that patient's HB dropped down to 5.8 today morning. BP and HR remain stable. Recommend IR evaluation, it seems the bleeding has slowed down, but active bleeding was noted on bleeding scan, likely in the area of transverse colon.  Ronnette Juniper 07/28/2018, 8:10 AM   Pager [phone] If no answer or after 5 PM call [phone]

## 2018-07-28 NOTE — Consult Note (Signed)
Consultation Note Date: 07/28/2018   Patient Name: [name]  DOB: 15-Sep-1928  MRN: [id number]  Age / Sex: 85 y.o., female  PCP: Josetta Huddle, MD Referring Physician: Kerney Elbe, DO  Reason for Consultation: Establishing goals of care  HPI/Patient Profile: 85 y.o. female  with past medical history of CVA with residual L hemiplegia, CHF, DM2, CKD IV, depression/anxiety, chronic anemia  admitted on 07/23/2018 with GI Bleeding. She has had bleeding scan that revealed bleeding in her transverse colon- GI consulted- colonoscopy declined- felt it is likely diverticular. Vascular performed angiogram, but there were no findings for embolization. She has continued to bleed and require blood transfusions with Hgb last night down to 5.8. Palliative medicine consulted for [place].   Clinical Assessment and Goals of Care:  I have reviewed medical records including EPIC notes, labs and imaging, assessed the patient and then met at the bedside with the patient to discuss diagnosis prognosis, GOC, EOL wishes, and options.  I introduced Palliative Medicine as specialized medical care for people living with serious illness. It focuses on providing relief from the symptoms and stress of a serious illness. The goal is to improve quality of life for both the patient and the family.  We discussed a brief life review of the patient. She is a retired Marine scientist. She has two children. [name] is her durable and healthcare POA, but her daughter Hoyle Sauer is also very involved in her decision making- she is close to all of her children.   The patient was alert and oriented to person place and situation. Able to participate in [place] discussion.   As far as functional and nutritional status- prior to this admission she was residing at [facility].  She tells me she enjoyed it there, although they were short staffed.  She did crafts for  fun.  She was able to feed herself.  She has not noticed a significant decline.  We discussed her current illness and what it means in the larger context of her on-going co-morbidities.  Natural disease trajectory and expectations at EOL were discussed.  We discussed the MOST form that was completed on her chart.  It indicated her wishes for comfort measures only and not to be transferred to the hospital.  She stated that this was her wishes however at the last minute she changed her mind and wanted to come to the hospital.  She said she does not really care for hospitals.  I discussed with her the worry that her bleeding may not be able to stop and that she will need continued blood transfusions or an invasive procedure.  I attempted to elicit values and goals of care important to the patient.  Currently, her hopes are to stabilize and return back to [facility].  She states this is like home for her.  If she is not able to improve she would prefer to return to [facility] for the duration of the life that she has left.  The difference between aggressive medical intervention  and comfort care were explained and considered in light of the patient's goals of care.  She knows that this is an option available to her and she is considering this is she says that she is just not ready to make that decision at this time.  I called her daughter Hoyle Sauer and relayed my above conversation to her.  Hoyle Sauer stated that she had also had the same concerns and was beginning to consider all of the above.  She is going to contact patient's son [name], and plan to, and have the discussion with patient as well.  Primary Decision Maker PATIENT with support of her children    SUMMARY OF RECOMMENDATIONS -Continue current scope of care -Hoyle Sauer and [name] to call PMT to arrange bedside discussion with them present- they are hopeful that patient's bleeding will stop and patient will be able to discharge back to [facility], however,  are prepared to discuss other options, including comfort care if it does not appear patient is improving    Code Status/Advance Care Planning:  DNR  Prognosis:    Unable to determine  Discharge Planning: To Be Determined   Review of Systems   Physical Exam Vitals signs and nursing note reviewed.  Constitutional:      General: She is not in acute distress.    Appearance: She is not toxic-appearing.  HENT:     Head: Normocephalic and atraumatic.  Cardiovascular:     Rate and Rhythm: Normal rate.  Pulmonary:     Effort: Pulmonary effort is normal.  Neurological:     Mental Status: She is alert and oriented to person, place, and time.     Comments: L facial droop, R tremor  Psychiatric:        Mood and Affect: Mood normal.        Thought Content: Thought content normal.     Vital Signs: BP (!) 177/80   Pulse 83   Temp 98.3 F (36.8 C) (Oral)   Resp 19   Ht 5' (1.524 m)   Wt 60.6 kg   SpO2 100%   BMI 26.09 kg/m  Pain Scale: 0-10 POSS *See Group Information*: 1-Acceptable,Awake and alert Pain Score: 0-No pain   SpO2: SpO2: 100 % O2 Device:SpO2: 100 % O2 Flow Rate: .O2 Flow Rate (L/min): 2 L/min  IO: Intake/output summary:   Intake/Output Summary (Last 24 hours) at 07/28/2018 1600 Last data filed at 07/28/2018 1521 Gross per 24 hour  Intake 1765 ml  Output -  Net 1765 ml    LBM: Last BM Date: 07/25/18 Baseline Weight: Weight: 74.8 kg Most recent weight: Weight: 60.6 kg     Palliative Assessment/Data: PPS: 30%    Thank you for this consult. Palliative medicine will continue to follow and assist as needed.   Time In: 1500 Time Out: 1615 Time Total: 75 minutes Greater than 50%  of this time was spent counseling and coordinating care related to the above assessment and plan.  Signed by: Mariana Kaufman, AGNP-C Palliative Medicine    Please contact Palliative Medicine Team phone at [phone] for questions and concerns.  For individual provider: See  Shea Evans

## 2018-07-28 NOTE — Progress Notes (Signed)
Patient had another episode of rectal bleed with medium amounts of blood clots. CBC to be drawn at 0700 post platelets and plasma transfusion.

## 2018-07-28 NOTE — Care Management Important Message (Signed)
Important Message  Patient Details  Name: [name] MRN: [id number] Date of Birth: Aug 06, 1928   Medicare Important Message Given:  Yes    Orbie Pyo 07/28/2018, 4:21 PM

## 2018-07-28 NOTE — Progress Notes (Signed)
PROGRESS NOTE    ANH BIGOS  KPT:[id number] DOB: [birth year] DOA: 07/23/2018 PCP: Josetta Huddle, MD  Brief Narrative:  HPI per Dr. Mitzi Hansen on 07/23/2018 [name] is a 85 y.o. female with medical history significant for uterine cancer status post hysterectomy and radiation, chronic diastolic CHF, insulin-dependent diabetes mellitus, chronic kidney disease stage IV, depression with anxiety, and chronic microcytic anemia, now presenting to the emergency department for evaluation of bleeding that began yesterday afternoon.  Patient reports a history of frequent dark red and maroon blood in her bowel movements which she had been told was due to hemorrhoids, and reports that she had acute increase in bleeding yesterday afternoon, describing dark and maroon blood "pouring out."  She thought that the blood was coming from her rectum, but was noted to have blood and clots at the introitus in the ED, was asked whether the bleeding she has been having was coming from the vagina or rectum, and she was not sure.  She denies any abdominal pain.  She denies any vomiting.  No recent fevers, chills, chest pain, or cough  **Interim History Presented to the emergency department nursing facility with rectal bleeding.  GI was consulted, but after discussions with family will try and avoid colonoscopy. Was stable from a bleeding standpoint yesterday AM and so NM Bleeding Scan was ordered and was positive given further drop in Hb. IR was consulted and she underwent Mesenteric Angiogram to include Celiac, SMA, and IMA and they were all patent and there was no finding to justify empiric or targeted embolization of lower GIB. She has continued to have bleeding and was given 3 more units of blood, 2 FFPs and 1 Platelet Count. GI Recommending IR re-evaluating and I Discussed case with Dr. Pascal Lux and Dr. Kathlene Cote who will re-evaluate the patient later today but feel the yield of retesting will be low. In the Interim  Palliative Care will be consulted for [place]; Case was discussed with Nephrology yesterday   Assessment & Plan:   Principal Problem:   Acute GI bleeding Active Problems:   Macrocytic anemia   Type 2 diabetes mellitus with stage 4 chronic kidney disease (HCC)   GI bleed   CKD (chronic kidney disease), stage IV (HCC)   Depression with anxiety  Acute GI Bleeding with BRBPR; likely lower GIB Macrocytic Anemia -Presented with reported frequent dark red and maroon blood in her bowel movements that she had been told was from hemorrhoids,  -Reported acute increase in bleeding since the afternoon of 3/10, determined to likely be coming from rectum.  -In ED with no fistula or abnormal fluid on pelvic US and no acute findings on CT abd/pelvis.   -Gastroenterology consulted, but agreed with family against colonoscopy at this time.   -She is total 4 units of pRBC's and Hb/Hct dropped again to 5.8/18.0 so will transfuse 2 more units of pRBC's -Also given 2 FFP's and 1 Pool of Platelets  -Obtained NM GI Bleeding Scan and this is was positive with Most likely site of bleeding is within the proximal to mid transverse colon. -IR was consulted and she Mesenteric Angiogram to include Celiac, SMA, and IMA and they were all patent and there was no finding to justify empiric or targeted embolization of lower GIB. -Appreciated GI consultative services and they recommend continuing Symptomatic management and discussing with IR and I have reached out to Dr. Kathlene Cote who states his team will see the patient but feels that repeating scans will have little  yield.  -C/w Ferrous Sulfate 325 mg po Daily -Continue monitor H&H's -C/w IV Pantoprazole 40 mg q12h for now -C/w Hydrocortisone 25 mg po BID -Transfuse as necessary to a goal Hb of >7.0 -Repeat CBC in AM and continue to Monitor for S/Sx of Bleeding -Diet advanced to FULL Liquid by GI  -Further Care per GI and IR  -Palliative Care Consulted for [place] Discussion    Chronic Diastolic Congestive Heart Failure -Patient appears to be slightly dry today. -Strict I&Os and Daily Weights; Weights are in accurate but appear to be up 7 lbs since admission  -Patient is +3.123 mL since admission -Hold Torsemide40 mg po Daily given worsened Renal Fxn in the setting of Contrast -Continue to Monitor Volume Status Carefully   AKI on CKD IV -SCr is 2.89 on admission, slightly increased from prior and repeat BUN/Cr went from 51/2.85 -> 47/2.80 -> 50/3.31 -WORSENED in the setting of Torsemide and Contrast for Angiogram; Now BUN Cr is slightly improved to 42/2.99 -Continue to Renally-dose medications and avoid Nephrotoxic Medications if Possible -Avoid Hypotension and Contrast Dyes if possible -Stopped Torsemide and gentle IVF Hydration is still being continued for now and is to end today  -Continue to Monitor and Trend Renal Fxn  -Repeat CMP in AM   Insulin-Dependent DM -Last A1c was 6.5% one yr ago. -Check CBG's and use a SSI with Novolog for now -CBG's ranging from 131-268  Depression and Anxeity -Stable. -Continue Depakote 125 mg po Daily and 250 mg po Daily qHS and Trazodone and 50 mg po qHS  Hyperkalemia -Potassium noted to be elevated at 5.5 and repeat this AM was 3.9 -Gentle IV fluids of normal saline at 50 mL/hr resumed yesterday and to stop today  -Continue to Monitor and repeat CMP in AM   Obesity -Estimated body mass index is 26.09 kg/m as calculated from the following:   Height as of this encounter: 5' (1.524 m).   Weight as of this encounter: 60.6 kg. -Weight Loss and Dietary Counseling given  Gout -Hold Allopurinol 150 mg po qHS given worsened Renal Function  Hx of Endometrial Cancer -S/p Hysterectomy -Has had Radiation Therapy with External Beam and tehn Brachytherapy  Chronic Respiratory Failure with Hypoxia -C/w DuoNeb 3 mL BID and 3 mL q6hprn Wheezing and SOB  Hyperbilirubinemia -Patient's T Bili was 1.9 yesterday AM  and repeat was 0.8 -Unclear Etiology Currently but it is improved -Continue to Monitor and Trend and Repeat CMP in AM   DVT prophylaxis: SCDs given recent GI Bleeding  Code Status: DO NOT RESUSCITATE Family Communication: No family present at beside  Disposition Plan: Pending cessastion of GI Bleeding and clearance by GI  Consultants:   [facility]  Interventional Radiology  Palliative Care Medicine  Discussed Case with Nephrology    Procedures:  NM Bleeding Scan  Procedure: US guided right CFA access, with mesenteric angio to include celiac, SMA, and IMA.   Closure of puncture with Exoseal. Done by Dr. Damita Dunnings   Antimicrobials:  Anti-infectives (From admission, onward)   None     Subjective: Seen and examined at bedside this AM.  A little uncomfortable but states that she does not know she has any bleeding anymore.  Refusing her suppository this morning.  No chest pain, lightheadedness or dizziness.  No other concerns or complaints at this time.  Objective: Vitals:   07/28/18 0926 07/28/18 1145 07/28/18 1208 07/28/18 1226  BP: (!) 165/61 (!) 167/57 (!) 175/74 (!) 164/52  Pulse: 85 86  85 88  Resp: 18  19 18   Temp: 98 F (36.7 C) 98.4 F (36.9 C) 98.4 F (36.9 C) 98.4 F (36.9 C)  TempSrc: Oral Oral Oral Oral  SpO2: 97% 100% 98% 96%  Weight:      Height:        Intake/Output Summary (Last 24 hours) at 07/28/2018 1347 Last data filed at 07/28/2018 1145 Gross per 24 hour  Intake 1450 ml  Output -  Net 1450 ml   Filed Weights   07/26/18 0500 07/27/18 0600 07/28/18 0553  Weight: 59.5 kg 57.5 kg 60.6 kg   Examination: Physical Exam:  Constitutional: Well-nourished, well-developed obese elderly Caucasian female currently no acute distress appears slightly uncomfortable Eyes: Lids are normal.  Has some conjunctival pallor.  Sclera anicteric ENMT: External ears and nose appear normal.  Slightly hard of hearing Neck: Supple no JVD Respiratory:  Diminished to auscultation bilaterally no appreciable wheezing, rales, rhonchi.  Patient not using any accessory muscles to breathe Cardiovascular: Rate and rhythm.  No appreciable murmurs, rubs, gallops.  Trace lower extremity edema noted Abdomen:, Non-tender to palpate. Mildly distended secondary to body habitus; Bowel sounds present GU: Deferred.  Musculoskeletal: No contractures or cyanosis. No joint deformities noted Skin: No appreciable rashes or lesions on a limited skin eval Neurologic: CN 2-12 grossly intact. Romberg Sign and Cerebellar Reflexes not assessed Psychiatric: Normal Judgement and Insight. Awake and Alert. Not agitated  Data Reviewed: I have personally reviewed following labs and imaging studies  CBC: Recent Labs  Lab 07/26/18 0332  07/27/18 0247 07/27/18 0828 07/27/18 1754 07/27/18 1936 07/28/18 0730  WBC 7.3  --  8.0  --  8.0 9.4 5.4  NEUTROABS 4.6  --  4.7  --  5.7 6.1 3.3  HGB 8.1*   < > 7.5* 7.2* 9.2* 8.5* 5.8*  HCT 24.4*   < > 23.0* 23.2* 29.3* 26.3* 18.0*  MCV 94.6  --  95.4  --  93.9 95.3 94.2  PLT 206  --  221  --  226 230 258   < > = values in this interval not displayed.   Basic Metabolic Panel: Recent Labs  Lab 07/24/18 0026 07/25/18 0321 07/26/18 0332 07/27/18 0247 07/28/18 0730  NA 137 137 135 135 138  K 5.5* 4.1 4.0 3.7 3.9  CL 97* 96* 100 101 105  CO2 27 28 26 26 26   GLUCOSE 213* 175* 169* 142* 140*  BUN 51* 47* 50* 44* 42*  CREATININE 2.85* 2.80* 3.31* 3.17* 2.99*  CALCIUM 10.0 10.1 8.9 8.9 9.2  MG  --   --  1.8 1.8 1.8  PHOS  --   --  4.4 3.5 3.9   GFR: Estimated Creatinine Clearance: 10.8 mL/min (A) (by C-G formula based on SCr of 2.99 mg/dL (H)). Liver Function Tests: Recent Labs  Lab 07/26/18 0332 07/27/18 0247 07/28/18 0730  AST 17 13* 15  ALT 21 17 17   ALKPHOS 47 51 51  BILITOT 1.9* 0.9 0.8  PROT 5.4* 5.4* 5.8*  ALBUMIN 2.6* 2.6* 2.9*   No results for input(s): LIPASE, AMYLASE in the last 168 hours. No results  for input(s): AMMONIA in the last 168 hours. Coagulation Profile: Recent Labs  Lab 07/23/18 0105  INR 1.0   Cardiac Enzymes: No results for input(s): CKTOTAL, CKMB, CKMBINDEX, TROPONINI in the last 168 hours. BNP (last 3 results) No results for input(s): PROBNP in the last 8760 hours. HbA1C: No results for input(s): HGBA1C in the last 72 hours. CBG: Recent  Labs  Lab 07/27/18 1949 07/27/18 2343 07/28/18 0400 07/28/18 0711 07/28/18 1152  GLUCAP 219* 268* 152* 131* 183*   Lipid Profile: No results for input(s): CHOL, HDL, LDLCALC, TRIG, CHOLHDL, LDLDIRECT in the last 72 hours. Thyroid Function Tests: No results for input(s): TSH, T4TOTAL, FREET4, T3FREE, THYROIDAB in the last 72 hours. Anemia Panel: No results for input(s): VITAMINB12, FOLATE, FERRITIN, TIBC, IRON, RETICCTPCT in the last 72 hours. Sepsis Labs: No results for input(s): PROCALCITON, LATICACIDVEN in the last 168 hours.  Recent Results (from the past 240 hour(s))  MRSA PCR Screening     Status: None   Collection Time: 07/24/18  4:29 AM  Result Value Ref Range Status   MRSA by PCR NEGATIVE NEGATIVE Final    Comment:        The GeneXpert MRSA Assay (FDA approved for NASAL specimens only), is one component of a comprehensive MRSA colonization surveillance program. It is not intended to diagnose MRSA infection nor to guide or monitor treatment for MRSA infections. Performed at [facility], [place] [address], [place], [place] [address]     Radiology Studies: No results found.  Scheduled Meds: . sodium chloride   Intravenous Once  . divalproex  125 mg Oral Daily  . divalproex  250 mg Oral QHS  . ferrous sulfate  325 mg Oral Q breakfast  . gabapentin  400 mg Oral QHS  . hydrocortisone  25 mg Rectal BID  . insulin aspart  0-9 Units Subcutaneous Q4H  . pantoprazole (PROTONIX) IV  40 mg Intravenous Q12H  . rOPINIRole  1 mg Oral BID  . traZODone  50 mg Oral QHS   Continuous Infusions: . sodium  chloride 50 mL/hr at 07/27/18 2126     LOS: 4 days   Kerney Elbe, DO [facility] PAGER is on [place]  If 7PM-7AM, please contact night-coverage [email or web address] Password [email or web address] 07/28/2018, 1:47 PM

## 2018-07-29 LAB — GLUCOSE, CAPILLARY
Glucose-Capillary: 159 mg/dL — ABNORMAL HIGH (ref 70–99)
Glucose-Capillary: 163 mg/dL — ABNORMAL HIGH (ref 70–99)
Glucose-Capillary: 194 mg/dL — ABNORMAL HIGH (ref 70–99)
Glucose-Capillary: 217 mg/dL — ABNORMAL HIGH (ref 70–99)
Glucose-Capillary: 240 mg/dL — ABNORMAL HIGH (ref 70–99)
Glucose-Capillary: 96 mg/dL (ref 70–99)

## 2018-07-29 LAB — COMPREHENSIVE METABOLIC PANEL
ALBUMIN: 3.1 g/dL — AB (ref 3.5–5.0)
ALT: 14 U/L (ref 0–44)
AST: 36 U/L (ref 15–41)
Alkaline Phosphatase: 60 U/L (ref 38–126)
Anion gap: 9 (ref 5–15)
BUN: 33 mg/dL — ABNORMAL HIGH (ref 8–23)
CO2: 24 mmol/L (ref 22–32)
CREATININE: 2.43 mg/dL — AB (ref 0.44–1.00)
Calcium: 9.6 mg/dL (ref 8.9–10.3)
Chloride: 104 mmol/L (ref 98–111)
GFR calc Af Amer: 20 mL/min — ABNORMAL LOW (ref >60)
GFR calc non Af Amer: 17 mL/min — ABNORMAL LOW (ref >60)
Glucose, Bld: 174 mg/dL — ABNORMAL HIGH (ref 70–99)
Potassium: 5.4 mmol/L — ABNORMAL HIGH (ref 3.5–5.1)
Sodium: 137 mmol/L (ref 135–145)
Total Bilirubin: 2.1 mg/dL — ABNORMAL HIGH (ref 0.3–1.2)
Total Protein: 6.1 g/dL — ABNORMAL LOW (ref 6.5–8.1)

## 2018-07-29 LAB — TYPE AND SCREEN
ABO/RH(D): A NEG
Antibody Screen: NEGATIVE
Unit division: 0
Unit division: 0
Unit division: 0

## 2018-07-29 LAB — BPAM RBC
BLOOD PRODUCT EXPIRATION DATE: 202003272359
BLOOD PRODUCT EXPIRATION DATE: 202004012359
Blood Product Expiration Date: 202004022359
ISSUE DATE / TIME: 202003151337
ISSUE DATE / TIME: 202003161208
ISSUE DATE / TIME: 202003160905
Unit Type and Rh: 600
Unit Type and Rh: 600
Unit Type and Rh: 600

## 2018-07-29 LAB — CBC WITH DIFFERENTIAL/PLATELET
Abs Immature Granulocytes: 0.08 10*3/uL — ABNORMAL HIGH (ref 0.00–0.07)
Abs Immature Granulocytes: 0.06 10*3/uL (ref 0.00–0.07)
Basophils Absolute: 0 10*3/uL (ref 0.0–0.1)
Basophils Absolute: 0 10*3/uL (ref 0.0–0.1)
Basophils Relative: 0 %
Basophils Relative: 1 %
Eosinophils Absolute: 0.3 10*3/uL (ref 0.0–0.5)
Eosinophils Absolute: 0.4 10*3/uL (ref 0.0–0.5)
Eosinophils Relative: 4 %
Eosinophils Relative: 5 %
HCT: 30.4 % — ABNORMAL LOW (ref 36.0–46.0)
HCT: 30.9 % — ABNORMAL LOW (ref 36.0–46.0)
Hemoglobin: 10.6 g/dL — ABNORMAL LOW (ref 12.0–15.0)
Hemoglobin: 10.5 g/dL — ABNORMAL LOW (ref 12.0–15.0)
Immature Granulocytes: 1 %
Immature Granulocytes: 1 %
LYMPHS ABS: 1.8 10*3/uL (ref 0.7–4.0)
LYMPHS PCT: 21 %
LYMPHS PCT: 18 %
Lymphs Abs: 1.4 10*3/uL (ref 0.7–4.0)
MCH: 31 pg (ref 26.0–34.0)
MCH: 30.5 pg (ref 26.0–34.0)
MCHC: 34.9 g/dL (ref 30.0–36.0)
MCHC: 34 g/dL (ref 30.0–36.0)
MCV: 88.9 fL (ref 80.0–100.0)
MCV: 89.8 fL (ref 80.0–100.0)
Monocytes Absolute: 0.9 10*3/uL (ref 0.1–1.0)
Monocytes Absolute: 0.8 10*3/uL (ref 0.1–1.0)
Monocytes Relative: 11 %
Monocytes Relative: 11 %
Neutro Abs: 5.3 10*3/uL (ref 1.7–7.7)
Neutro Abs: 5.2 10*3/uL (ref 1.7–7.7)
Neutrophils Relative %: 63 %
Neutrophils Relative %: 64 %
Platelets: 266 10*3/uL (ref 150–400)
Platelets: 256 10*3/uL (ref 150–400)
RBC: 3.42 MIL/uL — ABNORMAL LOW (ref 3.87–5.11)
RBC: 3.44 MIL/uL — ABNORMAL LOW (ref 3.87–5.11)
RDW: 17.1 % — ABNORMAL HIGH (ref 11.5–15.5)
RDW: 17.1 % — AB (ref 11.5–15.5)
WBC: 8.4 10*3/uL (ref 4.0–10.5)
WBC: 7.8 10*3/uL (ref 4.0–10.5)
nRBC: 0 % (ref 0.0–0.2)
nRBC: 0 % (ref 0.0–0.2)

## 2018-07-29 LAB — BPAM FFP
Blood Product Expiration Date: 202003182359
Blood Product Expiration Date: 202003192359
ISSUE DATE / TIME: 202003160113
ISSUE DATE / TIME: 202003161513
Unit Type and Rh: 600
Unit Type and Rh: 6200

## 2018-07-29 LAB — PREPARE FRESH FROZEN PLASMA
Unit division: 0
Unit division: 0

## 2018-07-29 LAB — HEMOGLOBIN AND HEMATOCRIT, BLOOD
HCT: 28.6 % — ABNORMAL LOW (ref 36.0–46.0)
HCT: 29.9 % — ABNORMAL LOW (ref 36.0–46.0)
HEMOGLOBIN: 9.9 g/dL — AB (ref 12.0–15.0)
Hemoglobin: 9.9 g/dL — ABNORMAL LOW (ref 12.0–15.0)

## 2018-07-29 LAB — PHOSPHORUS: Phosphorus: 3.4 mg/dL (ref 2.5–4.6)

## 2018-07-29 LAB — MAGNESIUM: Magnesium: 1.9 mg/dL (ref 1.7–2.4)

## 2018-07-29 MED ORDER — SODIUM CHLORIDE 0.9 % IV SOLN
INTRAVENOUS | Status: DC
  Administered 2018-07-29 – 2018-07-30 (×2): via INTRAVENOUS

## 2018-07-29 MED ORDER — HYDRALAZINE HCL 20 MG/ML IJ SOLN
5.0000 mg | Freq: Four times a day (QID) | INTRAMUSCULAR | Status: DC | PRN
  Administered 2018-07-29 – 2018-07-30 (×2): 5 mg via INTRAVENOUS
  Filled 2018-07-29 (×3): qty 1

## 2018-07-29 NOTE — Progress Notes (Signed)
Subjective: The patient was seen and examined at bedside. She has not had a bowel movement since yesterday morning, normal bowel movements reported overnight. So far rectal bleeding has not recurred in the last 24 hours.  Objective: Vital signs in last 24 hours: Temp:  [97.9 F (36.6 C)-99.3 F (37.4 C)] 98.4 F (36.9 C) (03/17 0249) Pulse Rate:  [82-89] 89 (03/17 0249) Resp:  [18-19] 18 (03/16 1619) BP: (161-181)/(32-82) 181/65 (03/17 0249) SpO2:  [93 %-100 %] 93 % (03/17 0249) Weight:  [69.3 kg] 69.3 kg (03/17 0408) Weight change: 8.7 kg Last BM Date: 07/25/18  EH:UDJS pallor GENERAL:not in distress ABDOMEN:soft, nondistended, nontender EXTREMITIES:no deformity  Lab Results: Results for orders placed or performed during the hospital encounter of 07/23/18 (from the past 48 hour(s))  Type and screen [facility]     Status: None   Collection Time: 07/27/18 12:13 PM  Result Value Ref Range   ABO/RH(D) A NEG    Antibody Screen NEG    Sample Expiration 07/30/2018    Unit Number [id number]    Blood Component Type RED CELLS,LR    Unit division 00    Status of Unit ISSUED,FINAL    Transfusion Status OK TO TRANSFUSE    Crossmatch Result Compatible    Unit Number [id number]    Blood Component Type RED CELLS,LR    Unit division 00    Status of Unit ISSUED,FINAL    Transfusion Status OK TO TRANSFUSE    Crossmatch Result Compatible    Unit Number [id number]    Blood Component Type RED CELLS,LR    Unit division 00    Status of Unit ISSUED,FINAL    Transfusion Status OK TO TRANSFUSE    Crossmatch Result      Compatible Performed at [facility], [address], [place], [place] [address]   Prepare RBC     Status: None   Collection Time: 07/27/18 12:14 PM  Result Value Ref Range   Order Confirmation      ORDER PROCESSED BY BLOOD BANK Performed at [facility], [place] [address], [place], [place] [address]   Glucose, capillary     Status:  Abnormal   Collection Time: 07/27/18 12:28 PM  Result Value Ref Range   Glucose-Capillary 269 (H) 70 - 99 mg/dL  Glucose, capillary     Status: Abnormal   Collection Time: 07/27/18  4:59 PM  Result Value Ref Range   Glucose-Capillary 227 (H) 70 - 99 mg/dL  Prepare fresh frozen plasma     Status: None   Collection Time: 07/27/18  5:33 PM  Result Value Ref Range   Unit Number [id number]    Blood Component Type THAWED PLASMA    Unit division 00    Status of Unit ISSUED,FINAL    Transfusion Status      OK TO TRANSFUSE Performed at [facility], [place] [address] [place], [place] [address]   Prepare Pheresed Platelets     Status: None   Collection Time: 07/27/18  5:33 PM  Result Value Ref Range   Unit Number [id number]    Blood Component Type PLTPHER LR1    Unit division 00    Status of Unit ISSUED,FINAL    Transfusion Status      OK TO TRANSFUSE Performed at [facility], [place] [address], [place], [place] [address]   CBC with Differential/Platelet     Status: Abnormal   Collection Time: 07/27/18  5:54 PM  Result Value Ref Range   WBC 8.0 4.0 - 10.5 K/uL   RBC 3.12 (L) 3.87 - 5.11 MIL/uL   Hemoglobin 9.2 (L) 12.0 - 15.0 g/dL    Comment: REPEATED TO VERIFY POST TRANSFUSION SPECIMEN    HCT 29.3 (L) 36.0 - 46.0 %   MCV 93.9 80.0 - 100.0 fL   MCH 29.5 26.0 - 34.0 pg   MCHC 31.4 30.0 - 36.0 g/dL   RDW 18.1 (H) 11.5 - 15.5 %   Platelets 226 150 - 400 K/uL   nRBC 0.0 0.0 - 0.2 %   Neutrophils Relative % 71 %   Neutro Abs 5.7 1.7 - 7.7 K/uL   Lymphocytes Relative 19 %   Lymphs Abs 1.5 0.7 - 4.0 K/uL   Monocytes Relative 7 %   Monocytes Absolute 0.6 0.1 - 1.0 K/uL   Eosinophils Relative 2 %   Eosinophils Absolute 0.2 0.0 - 0.5 K/uL   Basophils Relative 0 %   Basophils Absolute 0.0 0.0 - 0.1 K/uL   Immature Granulocytes 1 %   Abs Immature Granulocytes 0.05 0.00 - 0.07 K/uL    Comment: Performed at [facility], [address], [place], [place] [address]   CBC with Differential/Platelet     Status: Abnormal   Collection Time: 07/27/18  7:36 PM  Result Value Ref Range   WBC 9.4 4.0 - 10.5 K/uL   RBC 2.76 (L) 3.87 - 5.11 MIL/uL   Hemoglobin 8.5 (L) 12.0 - 15.0 g/dL   HCT 26.3 (L) 36.0 - 46.0 %   MCV 95.3 80.0 - 100.0 fL   MCH 30.8 26.0 - 34.0 pg   MCHC 32.3 30.0 - 36.0 g/dL   RDW 18.2 (H) 11.5 - 15.5 %   Platelets 230 150 - 400 K/uL   nRBC 0.0 0.0 - 0.2 %   Neutrophils Relative % 65 %   Neutro Abs 6.1 1.7 - 7.7 K/uL   Lymphocytes Relative 24 %   Lymphs Abs 2.2 0.7 - 4.0 K/uL   Monocytes Relative 8 %   Monocytes Absolute 0.8 0.1 - 1.0 K/uL   Eosinophils Relative 2 %   Eosinophils Absolute 0.2 0.0 - 0.5 K/uL   Basophils Relative 0 %   Basophils Absolute 0.0 0.0 - 0.1 K/uL   Immature Granulocytes 1 %   Abs Immature Granulocytes 0.06 0.00 - 0.07 K/uL    Comment: Performed at [facility] [address], [place], [place] [address]  Glucose, capillary     Status: Abnormal   Collection Time: 07/27/18  7:49 PM  Result Value Ref Range   Glucose-Capillary 219 (H) 70 - 99 mg/dL  Glucose, capillary     Status: Abnormal   Collection Time: 07/27/18 11:43 PM  Result Value Ref Range   Glucose-Capillary 268 (H) 70 - 99 mg/dL  Glucose, capillary     Status: Abnormal   Collection Time: 07/28/18  4:00 AM  Result Value Ref Range   Glucose-Capillary 152 (H) 70 - 99 mg/dL  Glucose, capillary     Status: Abnormal   Collection Time: 07/28/18  7:11 AM  Result Value Ref Range   Glucose-Capillary 131 (H) 70 - 99 mg/dL  CBC with Differential/Platelet     Status: Abnormal   Collection Time: 07/28/18  7:30 AM  Result Value Ref Range   WBC 5.4 4.0 - 10.5 K/uL   RBC 1.91 (L) 3.87 - 5.11 MIL/uL   Hemoglobin 5.8 (LL) 12.0 - 15.0 g/dL  Comment: REPEATED TO VERIFY THIS CRITICAL RESULT HAS VERIFIED AND BEEN CALLED TO ZARA JAMA RN BY SHANNON FLEMING ON 03 16 2020 AT 0826, AND HAS BEEN READ BACK.     HCT 18.0 (L) 36.0 - 46.0 %   MCV 94.2 80.0 -  100.0 fL   MCH 30.4 26.0 - 34.0 pg   MCHC 32.2 30.0 - 36.0 g/dL   RDW 18.7 (H) 11.5 - 15.5 %   Platelets 258 150 - 400 K/uL   nRBC 0.0 0.0 - 0.2 %   Neutrophils Relative % 62 %   Neutro Abs 3.3 1.7 - 7.7 K/uL   Lymphocytes Relative 23 %   Lymphs Abs 1.3 0.7 - 4.0 K/uL   Monocytes Relative 9 %   Monocytes Absolute 0.5 0.1 - 1.0 K/uL   Eosinophils Relative 5 %   Eosinophils Absolute 0.3 0.0 - 0.5 K/uL   Basophils Relative 0 %   Basophils Absolute 0.0 0.0 - 0.1 K/uL   Immature Granulocytes 1 %   Abs Immature Granulocytes 0.04 0.00 - 0.07 K/uL    Comment: Performed at [facility] [address] [place], [place] [address]  Comprehensive metabolic panel     Status: Abnormal   Collection Time: 07/28/18  7:30 AM  Result Value Ref Range   Sodium 138 135 - 145 mmol/L   Potassium 3.9 3.5 - 5.1 mmol/L   Chloride 105 98 - 111 mmol/L   CO2 26 22 - 32 mmol/L   Glucose, Bld 140 (H) 70 - 99 mg/dL   BUN 42 (H) 8 - 23 mg/dL   Creatinine, Ser 2.99 (H) 0.44 - 1.00 mg/dL   Calcium 9.2 8.9 - 10.3 mg/dL   Total Protein 5.8 (L) 6.5 - 8.1 g/dL   Albumin 2.9 (L) 3.5 - 5.0 g/dL   AST 15 15 - 41 U/L   ALT 17 0 - 44 U/L   Alkaline Phosphatase 51 38 - 126 U/L   Total Bilirubin 0.8 0.3 - 1.2 mg/dL   GFR calc non Af Amer 13 (L) >60 mL/min   GFR calc Af Amer 16 (L) >60 mL/min   Anion gap 7 5 - 15    Comment: Performed at [facility], [place] [address], [place], [place] [address]  Magnesium     Status: None   Collection Time: 07/28/18  7:30 AM  Result Value Ref Range   Magnesium 1.8 1.7 - 2.4 mg/dL    Comment: Performed at [facility] [address], [place], [place] [address]  Phosphorus     Status: None   Collection Time: 07/28/18  7:30 AM  Result Value Ref Range   Phosphorus 3.9 2.5 - 4.6 mg/dL    Comment: Performed at [facility] [address] [place], [place] [address]  Prepare fresh frozen plasma     Status: None   Collection Time: 07/28/18  8:53 AM  Result Value  Ref Range   Unit Number [id number]    Blood Component Type THAWED PLASMA    Unit division 00    Status of Unit ISSUED,FINAL    Transfusion Status      OK TO TRANSFUSE Performed at [facility] [address], [place], [place] [address]   Prepare RBC     Status: None   Collection Time: 07/28/18  8:54 AM  Result Value Ref Range   Order Confirmation      ORDER PROCESSED BY BLOOD BANK Performed at [facility], [place] [address], [place], [place] [address]   Glucose, capillary     Status: Abnormal   Collection Time: 07/28/18 11:52 AM  Result Value Ref Range   Glucose-Capillary 183 (H) 70 - 99 mg/dL  Glucose, capillary     Status: Abnormal   Collection Time: 07/28/18  4:16 PM  Result Value Ref Range   Glucose-Capillary 214 (H) 70 - 99 mg/dL  CBC with Differential/Platelet     Status: Abnormal   Collection Time: 07/28/18  5:34 PM  Result Value Ref Range   WBC 5.1 4.0 - 10.5 K/uL   RBC 2.54 (L) 3.87 - 5.11 MIL/uL   Hemoglobin 7.5 (L) 12.0 - 15.0 g/dL    Comment: REPEATED TO VERIFY POST TRANSFUSION SPECIMEN    HCT 22.7 (L) 36.0 - 46.0 %   MCV 89.4 80.0 - 100.0 fL   MCH 29.5 26.0 - 34.0 pg   MCHC 33.0 30.0 - 36.0 g/dL   RDW 16.4 (H) 11.5 - 15.5 %   Platelets 184 150 - 400 K/uL   nRBC 0.0 0.0 - 0.2 %   Neutrophils Relative % 68 %   Neutro Abs 3.4 1.7 - 7.7 K/uL   Lymphocytes Relative 18 %   Lymphs Abs 0.9 0.7 - 4.0 K/uL   Monocytes Relative 9 %   Monocytes Absolute 0.5 0.1 - 1.0 K/uL   Eosinophils Relative 4 %   Eosinophils Absolute 0.2 0.0 - 0.5 K/uL   Basophils Relative 0 %   Basophils Absolute 0.0 0.0 - 0.1 K/uL   Immature Granulocytes 1 %   Abs Immature Granulocytes 0.05 0.00 - 0.07 K/uL    Comment: Performed at [facility], [place] [address] [place], [place] [address]  Glucose, capillary     Status: Abnormal   Collection Time: 07/28/18  8:40 PM  Result Value Ref Range   Glucose-Capillary 269 (H) 70 - 99 mg/dL  CBC with Differential/Platelet      Status: Abnormal   Collection Time: 07/28/18 10:24 PM  Result Value Ref Range   WBC 7.6 4.0 - 10.5 K/uL   RBC 3.41 (L) 3.87 - 5.11 MIL/uL   Hemoglobin 10.1 (L) 12.0 - 15.0 g/dL    Comment: REPEATED TO VERIFY POST TRANSFUSION SPECIMEN    HCT 30.1 (L) 36.0 - 46.0 %   MCV 88.3 80.0 - 100.0 fL   MCH 29.6 26.0 - 34.0 pg   MCHC 33.6 30.0 - 36.0 g/dL   RDW 16.7 (H) 11.5 - 15.5 %   Platelets 258 150 - 400 K/uL   nRBC 0.0 0.0 - 0.2 %   Neutrophils Relative % 68 %   Neutro Abs 5.2 1.7 - 7.7 K/uL   Lymphocytes Relative 19 %   Lymphs Abs 1.5 0.7 - 4.0 K/uL   Monocytes Relative 9 %   Monocytes Absolute 0.7 0.1 - 1.0 K/uL   Eosinophils Relative 3 %   Eosinophils Absolute 0.2 0.0 - 0.5 K/uL   Basophils Relative 0 %   Basophils Absolute 0.0 0.0 - 0.1 K/uL   Immature Granulocytes 1 %   Abs Immature Granulocytes 0.07 0.00 - 0.07 K/uL    Comment: Performed at [facility], [place] [address] [place], [place] [address]  Glucose, capillary     Status: Abnormal   Collection Time: 07/28/18 11:56 PM  Result Value Ref Range   Glucose-Capillary 200 (H) 70 - 99 mg/dL  CBC with Differential/Platelet     Status: Abnormal   Collection  Time: 07/29/18  2:29 AM  Result Value Ref Range   WBC 8.4 4.0 - 10.5 K/uL   RBC 3.42 (L) 3.87 - 5.11 MIL/uL   Hemoglobin 10.6 (L) 12.0 - 15.0 g/dL   HCT 30.4 (L) 36.0 - 46.0 %   MCV 88.9 80.0 - 100.0 fL   MCH 31.0 26.0 - 34.0 pg   MCHC 34.9 30.0 - 36.0 g/dL   RDW 17.1 (H) 11.5 - 15.5 %   Platelets 266 150 - 400 K/uL   nRBC 0.0 0.0 - 0.2 %   Neutrophils Relative % 63 %   Neutro Abs 5.3 1.7 - 7.7 K/uL   Lymphocytes Relative 21 %   Lymphs Abs 1.8 0.7 - 4.0 K/uL   Monocytes Relative 11 %   Monocytes Absolute 0.9 0.1 - 1.0 K/uL   Eosinophils Relative 4 %   Eosinophils Absolute 0.3 0.0 - 0.5 K/uL   Basophils Relative 0 %   Basophils Absolute 0.0 0.0 - 0.1 K/uL   Immature Granulocytes 1 %   Abs Immature Granulocytes 0.08 (H) 0.00 - 0.07 K/uL    Comment:  Performed at [facility], [address], [place], [place] [address]  Comprehensive metabolic panel     Status: Abnormal   Collection Time: 07/29/18  2:29 AM  Result Value Ref Range   Sodium 137 135 - 145 mmol/L   Potassium 5.4 (H) 3.5 - 5.1 mmol/L    Comment: DELTA CHECK NOTED   Chloride 104 98 - 111 mmol/L   CO2 24 22 - 32 mmol/L   Glucose, Bld 174 (H) 70 - 99 mg/dL   BUN 33 (H) 8 - 23 mg/dL   Creatinine, Ser 2.43 (H) 0.44 - 1.00 mg/dL   Calcium 9.6 8.9 - 10.3 mg/dL   Total Protein 6.1 (L) 6.5 - 8.1 g/dL   Albumin 3.1 (L) 3.5 - 5.0 g/dL   AST 36 15 - 41 U/L   ALT 14 0 - 44 U/L   Alkaline Phosphatase 60 38 - 126 U/L   Total Bilirubin 2.1 (H) 0.3 - 1.2 mg/dL   GFR calc non Af Amer 17 (L) >60 mL/min   GFR calc Af Amer 20 (L) >60 mL/min   Anion gap 9 5 - 15    Comment: Performed at [facility], [place] [address], [place], [place] [address]  Magnesium     Status: None   Collection Time: 07/29/18  2:29 AM  Result Value Ref Range   Magnesium 1.9 1.7 - 2.4 mg/dL    Comment: Performed at [facility], [place] [address], [place], [place] [address]  Phosphorus     Status: None   Collection Time: 07/29/18  2:29 AM  Result Value Ref Range   Phosphorus 3.4 2.5 - 4.6 mg/dL    Comment: Performed at [facility] [address], [place], [place] [address]  Glucose, capillary     Status: Abnormal   Collection Time: 07/29/18  2:47 AM  Result Value Ref Range   Glucose-Capillary 159 (H) 70 - 99 mg/dL    Studies/Results: No results found.  Medications: I have reviewed the patient's current medications.  Assessment: Painless hematochezia likely related to diverticulosis Hemoglobin dropped down to 5.8 yesterday, she received 2 units of PRBC and 1 unit of fresh frozen plasma, hemoglobin remained stable at 10.1/10.6 Impaired renal function, GFR 17, creatinine 2.43  Plan: Advance diet to regular Okay to discharge home from GI standpoint, if no further bleeding  noted in  the next 12 hours.    Ronnette Juniper 07/29/2018, 8:41 AM   Pager [phone] If no answer or after 5 PM call [phone]

## 2018-07-29 NOTE — Progress Notes (Signed)
PROGRESS NOTE    [name]  VZC:[id number] DOB: October 21, 1928 DOA: 07/23/2018 PCP: Josetta Huddle, MD  Brief Narrative:  HPI per Dr. Mitzi Hansen on 07/23/2018 [name] is a 85 y.o. female with medical history significant for uterine cancer status post hysterectomy and radiation, chronic diastolic CHF, insulin-dependent diabetes mellitus, chronic kidney disease stage IV, depression with anxiety, and chronic microcytic anemia, now presenting to the emergency department for evaluation of bleeding that began yesterday afternoon.  Patient reports a history of frequent dark red and maroon blood in her bowel movements which she had been told was due to hemorrhoids, and reports that she had acute increase in bleeding yesterday afternoon, describing dark and maroon blood "pouring out."  She thought that the blood was coming from her rectum, but was noted to have blood and clots at the introitus in the ED, was asked whether the bleeding she has been having was coming from the vagina or rectum, and she was not sure.  She denies any abdominal pain.  She denies any vomiting.  No recent fevers, chills, chest pain, or cough  **Interim History Presented to the emergency department nursing facility with rectal bleeding.  GI was consulted, but after discussions with family will try and avoid colonoscopy. Was stable from a bleeding standpoint until 3/14 and so NM Bleeding Scan was ordered and was positive given further drop in Hb. IR was consulted and she underwent Mesenteric Angiogram to include Celiac, SMA, and IMA and they were all patent and there was no finding to justify empiric or targeted embolization of lower GIB. She has continued to have bleeding intermittlently and was given 3 more units of blood, 2 FFPs and 1 Platelet Count. IR Re-evaluated and this was a difficult situation given patient's intermittent bleeding and worsening of her renal function and feel that no empiric embolization can be performed  without further confirmation of bleeding source and have conservatively managed and watch the patient.  Palliative care medicine was consulted for goals of care discussion.  Patient has not had any further bleeding episodes currently and hemoglobin marker has improved after transfusions.  GI recommending that if she does not have any further bleeding episodes can be discharged in the next 12 hours but will watch overnight to make sure that she does not bleed and repeat blood work this afternoon and early in the morning.  Assessment & Plan:   Principal Problem:   Acute GI bleeding Active Problems:   Macrocytic anemia   Type 2 diabetes mellitus with stage 4 chronic kidney disease (HCC)   GI bleed   CKD (chronic kidney disease), stage IV (HCC)   Depression with anxiety   Advanced care planning/counseling discussion   Goals of care, counseling/discussion   Palliative care by specialist  Acute GI Bleeding with BRBPR; likely lower GIB Macrocytic Anemia -Presented with reported frequent dark red and maroon blood in her bowel movements that she had been told was from hemorrhoids,  -Reported acute increase in bleeding since the afternoon of 3/10, determined to likely be coming from rectum.  -In ED with no fistula or abnormal fluid on pelvic US and no acute findings on CT abd/pelvis.   -Gastroenterology consulted, but agreed with family against colonoscopy at this time.   -She is total 4 units of pRBC's and Hb/Hct dropped to 5.8/18.0 so will transfuse 2 more units of pRBC's; -Also given 2 FFP's and 1 Pool of Platelets  -Now Hb/Hct improved to 10.1/30.1 -> 10.6/30.4 -> 9.9/28.6 and  will continue to Monitor for S/Sx of Bleeding  -Obtained NM GI Bleeding Scan and this is was positive with Most likely site of bleeding is within the proximal to mid transverse colon. -IR was consulted and she Mesenteric Angiogram to include Celiac, SMA, and IMA and they were all patent and there was no finding to justify  empiric or targeted embolization of lower GIB. -Appreciated GI consultative services and they recommend continuing Symptomatic management and discussing with IR.  Eventual radiology feels this is a difficult situation as patient's bleeding is intermittent and she has had a worsening of her renal function and they do not plan for any angiogram and no empiric embolization can be performed without confirmation of the bleeding source.  Palliative care is involved at this time -C/w Ferrous Sulfate 325 mg po Daily -Continue monitor H&H's -C/w IV Pantoprazole 40 mg q12h for now changed to p.o. at discharge -C/w Hydrocortisone 25 mg po BID -Transfuse as necessary to a goal Hb of >7.0 -Repeat CBC in AM and continue to Monitor for S/Sx of Bleeding -Diet advanced to Regular diet by Gastroenterology -Further Care per GI and IR  -Palliative Care Consulted for [place] Discussion   Chronic Diastolic Congestive Heart Failure -Patient appears to be slightly dry today. -Strict I&Os and Daily Weights; Weights are in accurate but appear to be down 13 lbs since admission -Patient is +3.388 mL since admission -Continue to Hold Torsemide40 mg po Daily given worsened Renal Fxn in the setting of Contrast -Continue to Monitor Volume Status Carefully   AKI on CKD IV -SCr is 2.89 on admission, slightly increased from prior and repeat BUN/Cr went from 51/2.85 -> 47/2.80 -> 50/3.31 -WORSENED in the setting of Torsemide and Contrast for Angiogram; Now BUN Cr is slightly improved to 33/2.43 -Continue to Renally-dose medications and avoid Nephrotoxic Medications if Possible -Avoid Hypotension and Contrast Dyes if possible -Stopped Torsemide; resumed NS at 50 mL/hr -Continue to Monitor and Trend Renal Fxn  -Repeat CMP in AM   Insulin-Dependent DM -Last A1c was 6.5% one yr ago. -Check CBG's and use a SSI with Novolog for now -CBG's ranging from 96-240  Depression and Anxeity -Stable. -Continue Depakote 125 mg  po Daily and 250 mg po Daily qHS and Trazodone and 50 mg po qHS  Hyperkalemia -Potassium now 5.4 in the setting of blood transfusions -Gentle IV fluids of normal saline at 50 mL/hr again today -Continue to Monitor and repeat CMP in AM   Obesity -Estimated body mass index is 29.84 kg/m as calculated from the following:   Height as of this encounter: 5' (1.524 m).   Weight as of this encounter: 69.3 kg. -Weight Loss and Dietary Counseling given  Gout -Hold Allopurinol 150 mg po qHS given worsened Renal Function  Hx of Endometrial Cancer -S/p Hysterectomy -Has had Radiation Therapy with External Beam and tehn Brachytherapy  Chronic Respiratory Failure with Hypoxia -C/w DuoNeb 3 mL BID and 3 mL q6hprn Wheezing and SOB  Hyperbilirubinemia -Patient's T Bili was 1.9 went from 0.8 and is worsened to 2.1 -Unclear Etiology Currently but it is improved -Continue to Monitor and Trend and Repeat CMP in AM   DVT prophylaxis: SCDs given recent GI Bleeding  Code Status: DO NOT RESUSCITATE Family Communication: No family present at beside  Disposition Plan: Pending cessastion of GI Bleeding and clearance by GI  Consultants:   [facility] Gastroenterology  Interventional Radiology  Palliative Care Medicine  Discussed Case with Nephrology    Procedures:  NM Bleeding  Scan  Procedure: US guided right CFA access, with mesenteric angio to include celiac, SMA, and IMA.   Closure of puncture with Exoseal. Done by Dr. Damita Dunnings   Antimicrobials:  Anti-infectives (From admission, onward)   None     Subjective: Seen and examined at bedside this AM and she was sitting in the chair at bedside. No lightheadedness or dizziness. No further bleeding episodes. No other concerns or complaints at this time.   Objective: Vitals:   07/29/18 0249 07/29/18 0408 07/29/18 0848 07/29/18 1006  BP: (!) 181/65  (!) 217/76 (!) 164/55  Pulse: 89  81   Resp:   16   Temp: 98.4 F (36.9 C)  98 F  (36.7 C)   TempSrc: Oral  Oral   SpO2: 93%  91%   Weight:  69.3 kg    Height:        Intake/Output Summary (Last 24 hours) at 07/29/2018 1131 Last data filed at 07/28/2018 1758 Gross per 24 hour  Intake 1177 ml  Output 600 ml  Net 577 ml   Filed Weights   07/27/18 0600 07/28/18 0553 07/29/18 0408  Weight: 57.5 kg 60.6 kg 69.3 kg   Examination: Physical Exam:  Constitutional: Well-nourished, well-developed obese Caucasian elderly female currently no acute distress sitting in chair bedside appears calm and more comfortable today Eyes: Lids normal.  Has some conjunctival pallor.  Sclera anicteric ENMT: External ears and nose appear normal.  Slightly hard of hearing Neck: Supple no JVD Respiratory: Diminished auscultation bilaterally no appreciable wheezing, rales, rhonchi.  Patient not tachypneic or using accessory muscles breathe Cardiovascular: Rate and rhythm.  No appreciable murmurs, rubs, gallops.  Mild lower extremity edema noted Abdomen: Soft, nontender, mildly distended.  Bowel sounds present GU: Deferred Musculoskeletal: No contractures or cyanosis.  No joint deformity noted Skin: No appreciable rashes or lesions on a limited skin evaluation Neurologic: Cranial nerves II through XII grossly intact.  Romberg sign cerebellar reflexes were not assessed.  Has a resting tremor on the right arm Psychiatric: Normal judgment and insight.  Patient is awake, alert and not agitated.  Data Reviewed: I have personally reviewed following labs and imaging studies  CBC: Recent Labs  Lab 07/27/18 1936 07/28/18 0730 07/28/18 1734 07/28/18 2224 07/29/18 0229 07/29/18 0835  WBC 9.4 5.4 5.1 7.6 8.4  --   NEUTROABS 6.1 3.3 3.4 5.2 5.3  --   HGB 8.5* 5.8* 7.5* 10.1* 10.6* 9.9*  HCT 26.3* 18.0* 22.7* 30.1* 30.4* 28.6*  MCV 95.3 94.2 89.4 88.3 88.9  --   PLT 230 258 184 258 266  --    Basic Metabolic Panel: Recent Labs  Lab 07/25/18 0321 07/26/18 0332 07/27/18 0247 07/28/18 0730  07/29/18 0229  NA 137 135 135 138 137  K 4.1 4.0 3.7 3.9 5.4*  CL 96* 100 101 105 104  CO2 28 26 26 26 24   GLUCOSE 175* 169* 142* 140* 174*  BUN 47* 50* 44* 42* 33*  CREATININE 2.80* 3.31* 3.17* 2.99* 2.43*  CALCIUM 10.1 8.9 8.9 9.2 9.6  MG  --  1.8 1.8 1.8 1.9  PHOS  --  4.4 3.5 3.9 3.4   GFR: Estimated Creatinine Clearance: 14.2 mL/min (A) (by C-G formula based on SCr of 2.43 mg/dL (H)). Liver Function Tests: Recent Labs  Lab 07/26/18 0332 07/27/18 0247 07/28/18 0730 07/29/18 0229  AST 17 13* 15 36  ALT 21 17 17 14   ALKPHOS 47 51 51 60  BILITOT 1.9* 0.9 0.8 2.1*  PROT 5.4* 5.4* 5.8* 6.1*  ALBUMIN 2.6* 2.6* 2.9* 3.1*   No results for input(s): LIPASE, AMYLASE in the last 168 hours. No results for input(s): AMMONIA in the last 168 hours. Coagulation Profile: Recent Labs  Lab 07/23/18 0105  INR 1.0   Cardiac Enzymes: No results for input(s): CKTOTAL, CKMB, CKMBINDEX, TROPONINI in the last 168 hours. BNP (last 3 results) No results for input(s): PROBNP in the last 8760 hours. HbA1C: No results for input(s): HGBA1C in the last 72 hours. CBG: Recent Labs  Lab 07/28/18 1616 07/28/18 2040 07/28/18 2356 07/29/18 0247 07/29/18 0841  GLUCAP 214* 269* 200* 159* 96   Lipid Profile: No results for input(s): CHOL, HDL, LDLCALC, TRIG, CHOLHDL, LDLDIRECT in the last 72 hours. Thyroid Function Tests: No results for input(s): TSH, T4TOTAL, FREET4, T3FREE, THYROIDAB in the last 72 hours. Anemia Panel: No results for input(s): VITAMINB12, FOLATE, FERRITIN, TIBC, IRON, RETICCTPCT in the last 72 hours. Sepsis Labs: No results for input(s): PROCALCITON, LATICACIDVEN in the last 168 hours.  Recent Results (from the past 240 hour(s))  MRSA PCR Screening     Status: None   Collection Time: 07/24/18  4:29 AM  Result Value Ref Range Status   MRSA by PCR NEGATIVE NEGATIVE Final    Comment:        The GeneXpert MRSA Assay (FDA approved for NASAL specimens only), is one  component of a comprehensive MRSA colonization surveillance program. It is not intended to diagnose MRSA infection nor to guide or monitor treatment for MRSA infections. Performed at [facility], [place] [address] [place], [place] [address]     Radiology Studies: No results found.  Scheduled Meds: . sodium chloride   Intravenous Once  . divalproex  125 mg Oral Daily  . divalproex  250 mg Oral QHS  . ferrous sulfate  325 mg Oral Q breakfast  . gabapentin  400 mg Oral QHS  . hydrocortisone  25 mg Rectal BID  . insulin aspart  0-9 Units Subcutaneous Q4H  . pantoprazole (PROTONIX) IV  40 mg Intravenous Q12H  . rOPINIRole  1 mg Oral BID  . traZODone  50 mg Oral QHS   Continuous Infusions: . sodium chloride 50 mL/hr at 07/29/18 0923     LOS: 5 days   Kerney Elbe, DO [facility] PAGER is on AMION  If 7PM-7AM, please contact night-coverage [email or web address] Password [facility] 07/29/2018, 11:31 AM

## 2018-07-29 NOTE — Plan of Care (Signed)

## 2018-07-29 NOTE — Progress Notes (Signed)
Physical Therapy Treatment Patient Details Name: [name] MRN: [id number] DOB: [birth year] Today's Date: 07/29/2018    History of Present Illness Patient is 85 y/o female presenting to hospital with possible acute GI bleed. CT of abdomen negative for any acute finding. PMH includes DM, uterine cancer s/p hysterectomy, gout, anemia, depression, HTN, and CVA with L sided deficits.      PT Comments    Pt admitted with above diagnosis. Pt currently with functional limitations due to balance and endurance deficits. Pt was able to stand pivot with mod assist of 2.  Pt positioned well in chair.  Pt was able to stand but needed assist to complete the pivot.   Pt will benefit from skilled PT to increase their independence and safety with mobility to allow discharge to the venue listed below.     Follow Up Recommendations  SNF;Supervision/Assistance - 24 hour     Equipment Recommendations  Other (comment)(TBD at next venue)    Recommendations for Other Services       Precautions / Restrictions Precautions Precautions: Fall Restrictions Weight Bearing Restrictions: No    Mobility  Bed Mobility Overal bed mobility: Needs Assistance Bed Mobility: Supine to Sit;Sit to Supine     Supine to sit: Mod assist     General bed mobility comments: Patient required mod A for trunk control and min A for LE management for all bed mobility. Patient able to maintain sitting balance briefly without UE but required minA or 1UE assist. Verbal cues for upright posture to prevent posterior lean. Used bed pad to scoot hips towards EOB  Transfers Overall transfer level: Needs assistance Equipment used: 2 person hand held assist Transfers: Sit to/from [facility] Sit to Stand: Mod assist;+2 physical assistance Stand pivot transfers: Mod assist;+2 physical assistance       General transfer comment: Pt was able to stand with mod assist with extensor tone in trunk and LEs with LEs  blocked.  Pt pivoted to chair with mod assist and cues. Poor postural stability overall and leaning posteriorly.   Ambulation/Gait                 Stairs             Wheelchair Mobility    Modified Rankin (Stroke Patients Only)       Balance Overall balance assessment: Needs assistance Sitting-balance support: Single extremity supported;Feet supported Sitting balance-Leahy Scale: Poor Sitting balance - Comments: Patient required UE support to maintain sitting balance as well as external support by PT Postural control: Posterior lean Standing balance support: No upper extremity supported;During functional activity Standing balance-Leahy Scale: Poor Standing balance comment: Needs mod assist of 2 to stand statically with posterior lean.                             Cognition Arousal/Alertness: Awake/alert Behavior During Therapy: WFL for tasks assessed/performed Overall Cognitive Status: No family/caregiver present to determine baseline cognitive functioning                                 General Comments: Patient very pleasant. A&Ox4      Exercises      General Comments        Pertinent Vitals/Pain Pain Assessment: No/denies pain    Home Living  Prior Function            PT Goals (current goals can now be found in the care plan section) Acute Rehab PT Goals Patient Stated Goal: "sit up in bed" Progress towards PT goals: Progressing toward goals    Frequency    Min 2X/week      PT Plan Current plan remains appropriate    Co-evaluation              AM-PAC PT "6 Clicks" Mobility   Outcome Measure  Help needed turning from your back to your side while in a flat bed without using bedrails?: A Little Help needed moving from lying on your back to sitting on the side of a flat bed without using bedrails?: A Lot Help needed moving to and from a bed to a chair (including a  wheelchair)?: A Lot Help needed standing up from a chair using your arms (e.g., wheelchair or bedside chair)?: A Lot Help needed to walk in hospital room?: Total Help needed climbing 3-5 steps with a railing? : Total 6 Click Score: 11    End of Session Equipment Utilized During Treatment: Gait belt Activity Tolerance: Patient tolerated treatment well Patient left: with call bell/phone within reach;in chair;with chair alarm set Nurse Communication: Mobility status PT Visit Diagnosis: Other abnormalities of gait and mobility (R26.89);Muscle weakness (generalized) (M62.81);Difficulty in walking, not elsewhere classified (R26.2);Hemiplegia and hemiparesis Hemiplegia - Right/Left: Left Hemiplegia - caused by: Cerebral infarction     Time: 7824-2353 PT Time Calculation (min) (ACUTE ONLY): 18 min  Charges:  $Therapeutic Activity: 8-22 mins                     [place] Pager:  [phone]  Office:  [place] 07/29/2018, 10:17 AM

## 2018-07-29 NOTE — Progress Notes (Signed)
Palliative-   No charge note:   Attempted to call patient's daughter today.  Mariana Kaufman, AGNP-C Palliative Medicine  Please call Palliative Medicine team phone with any questions [phone]. For individual providers please see AMION.

## 2018-07-30 LAB — CBC WITH DIFFERENTIAL/PLATELET
Abs Immature Granulocytes: 0.06 10*3/uL (ref 0.00–0.07)
Basophils Absolute: 0 10*3/uL (ref 0.0–0.1)
Basophils Relative: 0 %
Eosinophils Absolute: 0.4 10*3/uL (ref 0.0–0.5)
Eosinophils Relative: 5 %
HCT: 28.4 % — ABNORMAL LOW (ref 36.0–46.0)
Hemoglobin: 9.6 g/dL — ABNORMAL LOW (ref 12.0–15.0)
Immature Granulocytes: 1 %
Lymphocytes Relative: 24 %
Lymphs Abs: 1.8 10*3/uL (ref 0.7–4.0)
MCH: 30.9 pg (ref 26.0–34.0)
MCHC: 33.8 g/dL (ref 30.0–36.0)
MCV: 91.3 fL (ref 80.0–100.0)
Monocytes Absolute: 0.9 10*3/uL (ref 0.1–1.0)
Monocytes Relative: 12 %
Neutro Abs: 4.4 10*3/uL (ref 1.7–7.7)
Neutrophils Relative %: 58 %
Platelets: 253 10*3/uL (ref 150–400)
RBC: 3.11 MIL/uL — AB (ref 3.87–5.11)
RDW: 17.2 % — ABNORMAL HIGH (ref 11.5–15.5)
WBC: 7.6 10*3/uL (ref 4.0–10.5)
nRBC: 0 % (ref 0.0–0.2)

## 2018-07-30 LAB — GLUCOSE, CAPILLARY
GLUCOSE-CAPILLARY: 168 mg/dL — AB (ref 70–99)
Glucose-Capillary: 165 mg/dL — ABNORMAL HIGH (ref 70–99)
Glucose-Capillary: 125 mg/dL — ABNORMAL HIGH (ref 70–99)
Glucose-Capillary: 251 mg/dL — ABNORMAL HIGH (ref 70–99)
Glucose-Capillary: 205 mg/dL — ABNORMAL HIGH (ref 70–99)

## 2018-07-30 LAB — COMPREHENSIVE METABOLIC PANEL
ALT: 13 U/L (ref 0–44)
AST: 18 U/L (ref 15–41)
Albumin: 2.7 g/dL — ABNORMAL LOW (ref 3.5–5.0)
Alkaline Phosphatase: 47 U/L (ref 38–126)
Anion gap: 9 (ref 5–15)
BUN: 27 mg/dL — ABNORMAL HIGH (ref 8–23)
CO2: 26 mmol/L (ref 22–32)
Calcium: 9.7 mg/dL (ref 8.9–10.3)
Chloride: 104 mmol/L (ref 98–111)
Creatinine, Ser: 2.28 mg/dL — ABNORMAL HIGH (ref 0.44–1.00)
GFR calc Af Amer: 22 mL/min — ABNORMAL LOW (ref >60)
GFR calc non Af Amer: 19 mL/min — ABNORMAL LOW (ref >60)
Glucose, Bld: 159 mg/dL — ABNORMAL HIGH (ref 70–99)
Potassium: 4 mmol/L (ref 3.5–5.1)
SODIUM: 139 mmol/L (ref 135–145)
Total Bilirubin: 0.9 mg/dL (ref 0.3–1.2)
Total Protein: 5.6 g/dL — ABNORMAL LOW (ref 6.5–8.1)

## 2018-07-30 LAB — HEMOGLOBIN AND HEMATOCRIT, BLOOD
HCT: 33.3 % — ABNORMAL LOW (ref 36.0–46.0)
HEMATOCRIT: 31.8 % — AB (ref 36.0–46.0)
Hemoglobin: 10.8 g/dL — ABNORMAL LOW (ref 12.0–15.0)
Hemoglobin: 10.7 g/dL — ABNORMAL LOW (ref 12.0–15.0)

## 2018-07-30 LAB — MAGNESIUM: Magnesium: 1.7 mg/dL (ref 1.7–2.4)

## 2018-07-30 LAB — PHOSPHORUS: Phosphorus: 2.6 mg/dL (ref 2.5–4.6)

## 2018-07-30 MED ORDER — HYDRALAZINE HCL 25 MG PO TABS
25.0000 mg | ORAL_TABLET | Freq: Four times a day (QID) | ORAL | Status: DC
  Administered 2018-07-30 – 2018-07-31 (×4): 25 mg via ORAL
  Filled 2018-07-30 (×4): qty 1

## 2018-07-30 MED ORDER — HYDRALAZINE HCL 20 MG/ML IJ SOLN
10.0000 mg | Freq: Once | INTRAMUSCULAR | Status: AC
  Administered 2018-07-30: 10 mg via INTRAVENOUS
  Filled 2018-07-30: qty 1

## 2018-07-30 MED ORDER — BISOPROLOL FUMARATE 5 MG PO TABS
10.0000 mg | ORAL_TABLET | Freq: Every day | ORAL | Status: DC
  Administered 2018-07-30 – 2018-07-31 (×2): 10 mg via ORAL
  Filled 2018-07-30 (×2): qty 2

## 2018-07-30 NOTE — Progress Notes (Signed)
Supervising Physician: Jacqulynn Cadet  Patient Status:  [facility] - In-pt  Chief Complaint: GI bleed  Subjective: No further bleeding.  When asked about going back to [facility] she smiles and states "Can I go back today? I want to go home."  Allergies: Penicillins  Medications: Prior to Admission medications   Medication Sig Start Date End Date Taking? Authorizing Provider  acetaminophen (TYLENOL) 325 MG tablet Take 650 mg by mouth See admin instructions. Take 650 mg by mouth three times a day and 650 mg every 8 hours as needed for pain   Yes [provider]  albuterol (PROVENTIL) (2.5 MG/3ML) 0.083% nebulizer solution Take 2.5 mg by nebulization every 2 (two) hours as needed for wheezing.   Yes [provider]  allopurinol (ZYLOPRIM) 300 MG tablet Take 150 mg by mouth at bedtime.   Yes [provider]  bisoprolol (ZEBETA) 10 MG tablet Take 1 tablet (10 mg total) by mouth daily. 07/23/13  Yes Josetta Huddle, MD  Carboxymethylcellulose Sod PF 0.5 % SOLN Place 1 drop into both eyes 2 (two) times daily.   Yes [provider]  Cholecalciferol (VITAMIN D3) 50 MCG (2000 UT) TABS Take 8,000 Units by mouth daily.   Yes [provider]  divalproex (DEPAKOTE SPRINKLE) 125 MG capsule 250 mg every morning and 125 mg in the afternoon Patient taking differently: Take 125-250 mg by mouth See admin instructions. Take 125 mg by mouth at noontime and 250 mg at bedtime 11/15/17  Yes Emokpae, Courage, MD  divalproex (DEPAKOTE) 125 MG DR tablet Take 250 mg by mouth daily after breakfast.   Yes [provider]  ferrous sulfate 325 (65 FE) MG tablet Take 325 mg by mouth daily with breakfast.   Yes [provider]  FLUoxetine (PROZAC) 20 MG tablet Take 30 mg by mouth daily.   Yes [provider]  gabapentin (NEURONTIN) 100 MG capsule Take 200 mg by mouth 2 (two) times daily.   Yes [provider]  gabapentin (NEURONTIN) 400 MG capsule  Take 400 mg by mouth at bedtime.   Yes [provider]  hydrALAZINE (APRESOLINE) 25 MG tablet Take 1 tablet (25 mg total) by mouth 3 (three) times daily. Patient taking differently: Take 25 mg by mouth 4 (four) times daily.  11/15/17  Yes Emokpae, Courage, MD  hydrocortisone (ANUSOL-HC) 25 MG suppository Place 25 mg rectally 2 (two) times daily. 07/23/18 07/29/18 Yes [provider]  insulin aspart (NOVOLOG) 100 UNIT/ML injection Inject 0-15 Units into the skin 3 (three) times daily with meals. Patient taking differently: Inject 0-12 Units into the skin See admin instructions. Inject 0-12 units into the skin 2 times a day, per sliding scale: BGL 60-150 = 0 unit(s); 151-200 = 2 units; 201-250 = 6 units; 251-300 = 8 units; 301-350 = 10 units; 351-400 = 12 units; [phone] = CALL MD 11/15/17  Yes Emokpae, Courage, MD  Insulin Glargine (BASAGLAR KWIKPEN) 100 UNIT/ML SOPN Inject 55 Units into the skin at bedtime.   Yes [provider]  linagliptin (TRADJENTA) 5 MG TABS tablet Take 5 mg by mouth daily.   Yes [provider]  magnesium oxide (MAG-OX) 400 MG tablet Take 400 mg by mouth daily.   Yes [provider]  NON FORMULARY Take 120 mLs by mouth See admin instructions. MedPass; Drink 120 ml's by mouth once a day   Yes [provider]  Olopatadine HCl 0.2 % SOLN Place 1 drop into both eyes daily. 07/08/17  Yes [provider]  OXYGEN Inhale 2 L into the lungs as needed (FOR SATS <90%).    Yes [provider]  pantoprazole (PROTONIX) 20 MG tablet Take 20 mg by mouth 2 (two) times daily.  07/04/17  Yes [provider]  rOPINIRole (REQUIP) 1 MG tablet Take 1 mg by mouth 2 (two) times daily.   Yes [provider]  torsemide (DEMADEX) 20 MG tablet Take 2 tablets (40 mg total) by mouth daily. 07/23/17  Yes Eugenie Filler, MD  traMADol (ULTRAM) 50 MG tablet Take 0.5 tablets (25 mg total) by mouth every 12 (twelve) hours as needed  for moderate pain. Patient taking differently: Take 25 mg by mouth every 12 (twelve) hours as needed (for pain).  11/15/17  Yes Emokpae, Courage, MD  vitamin B-12 (CYANOCOBALAMIN) 1000 MCG tablet Take 1,000 mcg by mouth daily.   Yes [provider]  Dextromethorphan-guaiFENesin 10-100 MG/5ML liquid Take 10 mLs by mouth every 4 (four) hours as needed (cough).    [provider]  fluticasone (FLONASE) 50 MCG/ACT nasal spray Place 1 spray into both nostrils daily.    [provider]  ipratropium-albuterol (DUONEB) 0.5-2.5 (3) MG/3ML SOLN Take 3 mLs by nebulization 3 (three) times daily. Patient not taking: Reported on 07/23/2018 11/15/17   Roxan Hockey, MD  LEVEMIR FLEXTOUCH 100 UNIT/ML Pen Inject 45 Units into the skin daily. 10/18/17   [provider]  Menthol, Topical Analgesic, (BIOFREEZE ROLL-ON) 4 % GEL Apply 1 application topically 2 (two) times daily.    [provider]  predniSONE (DELTASONE) 20 MG tablet Take 1 tablet (20 mg total) by mouth daily with breakfast. Patient not taking: Reported on 07/23/2018 11/15/17   Roxan Hockey, MD  PRESCRIPTION MEDICATION Apply 1 application topically every Monday. selsum blue shampoo for itchy/flaky scalp    [provider]  senna-docusate (SENOKOT-S) 8.6-50 MG tablet Take 2 tablets by mouth at bedtime. Patient not taking: Reported on 07/23/2018 11/15/17 11/15/18  Roxan Hockey, MD  simethicone (MYLICON) 80 MG chewable tablet Chew 80 mg by mouth 3 (three) times daily as needed for flatulence (and bloating).    [provider]  sodium chloride (DEEP SEA NASAL SPRAY) 0.65 % nasal spray Place 1 spray into the nose 4 (four) times daily as needed for congestion.     [provider]  tetrahydrozoline 0.05 % ophthalmic solution Place 2 drops into both eyes 2 (two) times daily.    [provider]  traZODone (DESYREL) 50 MG tablet Take 50 mg by mouth at bedtime. 06/25/17   [provider]     Vital Signs: BP (!) 199/74 (BP Location: Right Arm)   Pulse 77   Temp 98.9 F (37.2 C) (Oral)   Resp 16   Ht 5' (1.524 m)   Wt 137 lb 5.6 oz (62.3 kg)   SpO2 95%   BMI 26.82 kg/m   Physical Exam  NAD, alert, lying in bed Abdomen: soft, non-tender.  No evidence of bleeding or bright red blood per rectum   Imaging: No results found.  Labs:  CBC: Recent Labs    07/28/18 2224 07/29/18 0229  07/29/18 1455 07/29/18 2114 07/30/18 0224 07/30/18 0917  WBC 7.6 8.4  --  7.8  --  7.6  --   HGB 10.1* 10.6*   < > 10.5* 9.9* 9.6* 10.8*  HCT 30.1* 30.4*   < > 30.9* 29.9* 28.4* 33.3*  PLT 258 266  --  256  --  253  --    < > = values in this interval not displayed.    COAGS: Recent Labs    07/23/18 0105  INR 1.0    BMP: Recent Labs    07/27/18 0247 07/28/18 0730 07/29/18 0229 07/30/18 0224  NA 135 138 137 139  K 3.7 3.9 5.4* 4.0  CL 101 105 104 104  CO2 26 26 24 26   GLUCOSE 142* 140* 174* 159*  BUN 44* 42* 33* 27*  CALCIUM 8.9 9.2 9.6 9.7  CREATININE 3.17* 2.99* 2.43* 2.28*  GFRNONAA 13* 13* 17* 19*  GFRAA 15* 16* 20* 22*    LIVER FUNCTION TESTS: Recent Labs    07/27/18 0247 07/28/18 0730 07/29/18 0229 07/30/18 0224  BILITOT 0.9 0.8 2.1* 0.9  AST 13* 15 36 18  ALT 17 17 14 13   ALKPHOS 51 51 60 47  PROT 5.4* 5.8* 6.1* 5.6*  ALBUMIN 2.6* 2.9* 3.1* 2.7*    Assessment and Plan: GI bleed s/p angiogram 3/13 without intervention Patient has not had any further bleeding.  Her HgB has stabilized.  She is at risk for re-bleeding, however palliative care is involved in meeting with patient and family and appears goals are to return to [facility] with comfort care.  IR remains available if needed.  Electronically Signed: Docia Barrier, PA 07/30/2018, 1:18 PM   I spent a total of 15 Minutes at the the patient's bedside AND on the patient's hospital floor or unit, greater than 50% of which was counseling/coordinating care for GI  bleed.

## 2018-07-30 NOTE — Progress Notes (Signed)
Occupational Therapy Treatment Patient Details Name: [name] MRN: [id number] DOB: 01-13-1929 Today's Date: 07/30/2018    History of present illness Patient is 85 y/o female presenting to hospital with possible acute GI bleed. CT of abdomen negative for any acute finding. PMH includes DM, uterine cancer s/p hysterectomy, gout, anemia, depression, HTN, and CVA with L sided deficits.     OT comments  Pt eager to dc back to [facility].  Continues to require mod assist for bed mobility, max assist for lateral scoot/squat pivot to recliner.  Pt requires min guard to min assist seated EOB for grooming tasks and UB dressing; limited sitting balance and activity tolerance.  Appears near baseline.  Will follow while admitted.    Follow Up Recommendations  SNF    Equipment Recommendations  None recommended by OT    Recommendations for Other Services      Precautions / Restrictions Precautions Precautions: Fall Restrictions Weight Bearing Restrictions: No       Mobility Bed Mobility Overal bed mobility: Needs Assistance Bed Mobility: Supine to Sit     Supine to sit: Mod assist     General bed mobility comments: mod assist to sequence steps, trunk support and scoot hips towards EOB   Transfers Overall transfer level: Needs assistance   Transfers: Lateral/Scoot Transfers          Lateral/Scoot Transfers: Max assist General transfer comment: max assist squat pivot/lateral scoot towards R side into drop arm recliner, cueing for sequencing and safety     Balance Overall balance assessment: Needs assistance Sitting-balance support: Single extremity supported;Feet supported Sitting balance-Leahy Scale: Poor Sitting balance - Comments: min guard to min assist without R UE support Postural control: Posterior lean                                 ADL either performed or assessed with clinical judgement   ADL Overall ADL's : Needs assistance/impaired      Grooming: Wash/dry hands;Wash/dry face;Applying deodorant;Minimal assistance;Brushing hair;Sitting Grooming Details (indicate cue type and reason): min assist with encouragement to engage in grooming, L lateral lean min assist sitting balance          Upper Body Dressing : Moderate assistance;Sitting Upper Body Dressing Details (indicate cue type and reason): mod assist to don new gown at [employer] Transfer: Maximal assistance;Squat-pivot(squat pivot/lateral scoot to drop arm recliner ) Toilet Transfer Details (indicate cue type and reason): simulated to recliner                  Vision       Perception     Praxis      Cognition Arousal/Alertness: Awake/alert Behavior During Therapy: WFL for tasks assessed/performed Overall Cognitive Status: No family/caregiver present to determine baseline cognitive functioning                                 General Comments: Patient very pleasant. A&Ox4        Exercises     Shoulder Instructions       General Comments      Pertinent Vitals/ Pain       Pain Assessment: No/denies pain  Home Living  Prior Functioning/Environment              Frequency  Min 2X/week        Progress Toward Goals  OT Goals(current goals can now be found in the care plan section)  Progress towards OT goals: Progressing toward goals  Acute Rehab OT Goals Patient Stated Goal: to get back home (to [facility]) OT Goal Formulation: With patient Time For Goal Achievement: 08/08/18 Potential to Achieve Goals: Good  Plan Discharge plan remains appropriate;Frequency remains appropriate    Co-evaluation                 AM-PAC OT "6 Clicks" Daily Activity     Outcome Measure   Help from another person eating meals?: None Help from another person taking care of personal grooming?: A Little Help from another person toileting, which includes using  toliet, bedpan, or urinal?: Total Help from another person bathing (including washing, rinsing, drying)?: A Lot Help from another person to put on and taking off regular upper body clothing?: A Lot Help from another person to put on and taking off regular lower body clothing?: Total 6 Click Score: 13    End of Session Equipment Utilized During Treatment: Gait belt  OT Visit Diagnosis: Unsteadiness on feet (R26.81);Other abnormalities of gait and mobility (R26.89);Muscle weakness (generalized) (M62.81)   Activity Tolerance Patient tolerated treatment well   Patient Left in chair;with call bell/phone within reach;with chair alarm set   Nurse Communication Mobility status        Time: 1334-1401 OT Time Calculation (min): 27 min  Charges: OT General Charges $OT Visit: 1 Visit OT Treatments $Self Care/Home Management : 23-37 mins  Delight Stare, [place] Pager [phone] Office [phone]     Delight Stare 07/30/2018, 2:59 PM

## 2018-07-30 NOTE — Progress Notes (Signed)
PROGRESS NOTE    CARILYN WOOLSTON  BWG:[id number] DOB: February 15, 1929 DOA: 07/23/2018 PCP: Josetta Huddle, MD   Brief Narrative:  HPI On 07/23/2018 by Dr. Mitzi Hansen [name] is a 85 y.o. female with medical history significant for uterine cancer status post hysterectomy and radiation, chronic diastolic CHF, insulin-dependent diabetes mellitus, chronic kidney disease stage IV, depression with anxiety, and chronic microcytic anemia, now presenting to the emergency department for evaluation of bleeding that began yesterday afternoon.  Patient reports a history of frequent dark red and maroon blood in her bowel movements which she had been told was due to hemorrhoids, and reports that she had acute increase in bleeding yesterday afternoon, describing dark and maroon blood "pouring out."  She thought that the blood was coming from her rectum, but was noted to have blood and clots at the introitus in the ED, was asked whether the bleeding she has been having was coming from the vagina or rectum, and she was not sure.  She denies any abdominal pain.  She denies any vomiting.  No recent fevers, chills, chest pain, or cough.  Interim history Admitted for rectal bleeding.  Bleeding scan was ordered and was positive as patient continued to have drop in her hemoglobin.  IR was consulted, underwent mesenteric angiogram however there is no finding to justify empiric or targeted embolization of lower GI bleed.  Patient received several units of PRBC, 2 FFP's, and 1 platelet.  GI, IR and palliative care recommended conservative management.  Assessment & Plan   Acute GI bleed/BRBPR/Macrocytic Anemia -Resented with dark red blood/maroon blood in her bowel movements -Pelvic ultrasound showed no fistula or abnormal fluid.  CT abdomen pelvis showed no acute findings -Gastroenterology consulted and appreciated, however family declined colonoscopy -Patient's hemoglobin dropped to 5.8 -She has received a total of 4  units PRBC, 2 FFP's, 1 pooled platelets -Hemoglobin currently 9.6 -Patient did have NM GI bleeding scan which is positive, most likely site of bleeding within the proximal to mid transverse colon -Interventional radiology consulted and appreciated-status post mesenteric angiogram to include celiac, SMA, IMA, all were patent, no findings to justify empiric or targeted embolization of lower GI bleed -GI and IR recommending conservative management at this time.  Difficult situation given patient's bleeding is intermittent and she did have worsening of her renal function.  No plan for repeat angiogram or empiric embolization.  Recommended palliative care consultation -Continue iron supplementation, PPI, hydrocortisone -Continue to monitor CBC  Chronic diastolic heart failure -Patient appears to be compensated and euvolemic -Monitor intake and output, daily weights -Torsemide held  Acute kidney injury on chronic kidney disease, stage IV -Likely worsened given torsemide as well as contrast for angiogram -Was given normal saline -Creatinine currently 2.28 -Continue to monitor BMP  Insulin-dependent diabetes mellitus -Hemoglobin A1c 6.51-year ago -Continue insulin sliding scale CBG monitoring  Depression/anxiety -Stable, continue Depakote, trazodone  Hyperkalemia -Suspect secondary to blood transfusions, acute kidney injury -resolved  Gout  -allopurinol held  History of endometrial cancer -Status post hysterectomy, radiation therapy with external beam and then brachytherapy  Chronic respiratory failure with hypoxia -Continue nebulizer treatments as needed  Hyperbilirubinemia -Unclear etiology but is improved -Monitor  Obesity   DVT Prophylaxis  SCDs  Code Status: DNR  Family Communication: No family at bedside  Disposition Plan: Admitted. Discharge to SNF when hemoglobin is stable  Consultants Gastroenterology Interventional radiology Palliative care  Procedures   Ultrasound-guided right CFA access with mesenteric angiogram to include celiac, SMA, IMA  Antibiotics   Anti-infectives (From admission, onward)   None      Subjective:   Mariacristina Aday seen and examined today.  No complaints this morning.  Denies current chest pain, shortness breath, abdominal pain, nausea or vomiting, diarrhea constipation, dizziness or headache.  Objective:   Vitals:   07/29/18 1531 07/30/18 0000 07/30/18 0418 07/30/18 0733  BP: (!) 177/58 (!) 178/78  (!) 199/74  Pulse: 80 87  77  Resp: 16   16  Temp: 97.9 F (36.6 C) 99.3 F (37.4 C)  98.9 F (37.2 C)  TempSrc: Oral Oral  Oral  SpO2: 97% 95%  95%  Weight:   62.3 kg   Height:        Intake/Output Summary (Last 24 hours) at 07/30/2018 1455 Last data filed at 07/30/2018 0400 Gross per 24 hour  Intake 450 ml  Output 252 ml  Net 198 ml   Filed Weights   07/28/18 0553 07/29/18 0408 07/30/18 0418  Weight: 60.6 kg 69.3 kg 62.3 kg    Exam  General: Well developed, well nourished, NAD, appears stated age  57: NCAT,mucous membranes moist.   Cardiovascular: S1 S2 auscultated, RRR  Respiratory: Clear to auscultation bilaterally with equal chest rise  Abdomen: Soft, nontender, nondistended, + bowel sounds  Extremities: warm dry without cyanosis clubbing or edema  Neuro: AAOx3, nonfocal. Resting RUE tremor  Psych: Pleasant, appropriate mood and affect   Data Reviewed: I have personally reviewed following labs and imaging studies  CBC: Recent Labs  Lab 07/28/18 1734 07/28/18 2224 07/29/18 0229 07/29/18 0835 07/29/18 1455 07/29/18 2114 07/30/18 0224 07/30/18 0917  WBC 5.1 7.6 8.4  --  7.8  --  7.6  --   NEUTROABS 3.4 5.2 5.3  --  5.2  --  4.4  --   HGB 7.5* 10.1* 10.6* 9.9* 10.5* 9.9* 9.6* 10.8*  HCT 22.7* 30.1* 30.4* 28.6* 30.9* 29.9* 28.4* 33.3*  MCV 89.4 88.3 88.9  --  89.8  --  91.3  --   PLT 184 258 266  --  256  --  253  --    Basic Metabolic Panel: Recent Labs  Lab 07/26/18  0332 07/27/18 0247 07/28/18 0730 07/29/18 0229 07/30/18 0224  NA 135 135 138 137 139  K 4.0 3.7 3.9 5.4* 4.0  CL 100 101 105 104 104  CO2 26 26 26 24 26   GLUCOSE 169* 142* 140* 174* 159*  BUN 50* 44* 42* 33* 27*  CREATININE 3.31* 3.17* 2.99* 2.43* 2.28*  CALCIUM 8.9 8.9 9.2 9.6 9.7  MG 1.8 1.8 1.8 1.9 1.7  PHOS 4.4 3.5 3.9 3.4 2.6   GFR: Estimated Creatinine Clearance: 14.3 mL/min (A) (by C-G formula based on SCr of 2.28 mg/dL (H)). Liver Function Tests: Recent Labs  Lab 07/26/18 0332 07/27/18 0247 07/28/18 0730 07/29/18 0229 07/30/18 0224  AST 17 13* 15 36 18  ALT 21 17 17 14 13   ALKPHOS 47 51 51 60 47  BILITOT 1.9* 0.9 0.8 2.1* 0.9  PROT 5.4* 5.4* 5.8* 6.1* 5.6*  ALBUMIN 2.6* 2.6* 2.9* 3.1* 2.7*   No results for input(s): LIPASE, AMYLASE in the last 168 hours. No results for input(s): AMMONIA in the last 168 hours. Coagulation Profile: No results for input(s): INR, PROTIME in the last 168 hours. Cardiac Enzymes: No results for input(s): CKTOTAL, CKMB, CKMBINDEX, TROPONINI in the last 168 hours. BNP (last 3 results) No results for input(s): PROBNP in the last 8760 hours. HbA1C: No results for input(s): HGBA1C  in the last 72 hours. CBG: Recent Labs  Lab 07/29/18 1929 07/29/18 2339 07/30/18 0347 07/30/18 0732 07/30/18 1157  GLUCAP 163* 194* 165* 125* 251*   Lipid Profile: No results for input(s): CHOL, HDL, LDLCALC, TRIG, CHOLHDL, LDLDIRECT in the last 72 hours. Thyroid Function Tests: No results for input(s): TSH, T4TOTAL, FREET4, T3FREE, THYROIDAB in the last 72 hours. Anemia Panel: No results for input(s): VITAMINB12, FOLATE, FERRITIN, TIBC, IRON, RETICCTPCT in the last 72 hours. Urine analysis:    Component Value Date/Time   COLORURINE YELLOW 07/19/2017 1430   APPEARANCEUR CLEAR 07/19/2017 1430   LABSPEC 1.004 (L) 07/19/2017 1430   PHURINE 7.0 07/19/2017 1430   GLUCOSEU NEGATIVE 07/19/2017 1430   HGBUR NEGATIVE 07/19/2017 1430   BILIRUBINUR  NEGATIVE 07/19/2017 1430   KETONESUR NEGATIVE 07/19/2017 1430   PROTEINUR NEGATIVE 07/19/2017 1430   UROBILINOGEN 0.2 07/29/2013 0926   NITRITE NEGATIVE 07/19/2017 1430   LEUKOCYTESUR NEGATIVE 07/19/2017 1430   Sepsis Labs: @LABRCNTIP (procalcitonin:4,lacticidven:4)  ) Recent Results (from the past 240 hour(s))  MRSA PCR Screening     Status: None   Collection Time: 07/24/18  4:29 AM  Result Value Ref Range Status   MRSA by PCR NEGATIVE NEGATIVE Final    Comment:        The GeneXpert MRSA Assay (FDA approved for NASAL specimens only), is one component of a comprehensive MRSA colonization surveillance program. It is not intended to diagnose MRSA infection nor to guide or monitor treatment for MRSA infections. Performed at [facility], [place] [address], [place], [place] [address]       Radiology Studies: No results found.   Scheduled Meds: . sodium chloride   Intravenous Once  . bisoprolol  10 mg Oral Daily  . divalproex  125 mg Oral Daily  . divalproex  250 mg Oral QHS  . ferrous sulfate  325 mg Oral Q breakfast  . gabapentin  400 mg Oral QHS  . hydrALAZINE  25 mg Oral QID  . hydrocortisone  25 mg Rectal BID  . insulin aspart  0-9 Units Subcutaneous Q4H  . pantoprazole (PROTONIX) IV  40 mg Intravenous Q12H  . rOPINIRole  1 mg Oral BID  . traZODone  50 mg Oral QHS   Continuous Infusions: . sodium chloride 50 mL/hr at 07/30/18 1202     LOS: 6 days   Time Spent in minutes   30 minutes  Norene Oliveri D.O. on 07/30/2018 at 2:55 PM  Between 7am to 7pm - Please see pager noted on [email or web address]  After 7pm go to [email or web address]  And look for the night coverage person covering for me after hours  [facility] Office  [phone]

## 2018-07-30 NOTE — Progress Notes (Signed)
                                                                                                                                                                                                           Daily Progress Note   Patient Name: [name]       Date: 07/30/2018 DOB: 27-Oct-1928  Age: 85 y.o. MRN#: [id number] Attending Physician: Cristal Ford, DO Primary Care Physician: Josetta Huddle, MD Admit Date: 07/23/2018  Reason for Consultation/Follow-up: Establishing goals of care  Subjective: Patient seen- resting comfortably. Spoke with [name]. Noted Hgb stabilized, no further bleeding. Per discussion with patient, if she returns to [facility] and she declined further, or bleeding resumed, she would not want to return to the hospital- would prefer comfort care. Will not change MOST form- will leave as is.    Palliative Assessment/Data: PPS: 40%     Palliative Care Assessment & Plan   Patient Profile: 85 y.o. female  with past medical history of CVA with residual L hemiplegia, CHF, DM2, CKD IV, depression/anxiety, chronic anemia  admitted on 07/23/2018 with GI Bleeding. She has had bleeding scan that revealed bleeding in her transverse colon- GI consulted- colonoscopy declined- felt it is likely diverticular. Vascular performed angiogram, but there were no findings for embolization. She has continued to bleed and require blood transfusions with Hgb last night down to 5.8. Palliative medicine consulted for [place].   Assessment/Recommendations/Plan   DC to [facility]  Refer for Outpatient Palliative- discharging physician please recommend in discharge summary and instructions   Goals of Care and Additional Recommendations:  Limitations on Scope of Treatment: No Diagnostics and No Surgical Procedures  Code Status:  DNR  Prognosis:   Unable to determine  Discharge Planning:  SNF with Palliative  Care plan was discussed with patient and her daughter- [name]  Thank you for allowing the Palliative Medicine Team to assist in the care of this patient.   Time In: 1245 Time Out: 1315 Total Time 30 mins Prolonged Time Billed No      Greater than 50%  of this time was spent counseling and coordinating care related to the above assessment and plan.  Mariana Kaufman, AGNP-C Palliative Medicine   Please contact Palliative Medicine Team phone at [phone] for questions and concerns.

## 2018-07-31 LAB — BASIC METABOLIC PANEL
Anion gap: 10 (ref 5–15)
BUN: 25 mg/dL — ABNORMAL HIGH (ref 8–23)
CHLORIDE: 104 mmol/L (ref 98–111)
CO2: 20 mmol/L — ABNORMAL LOW (ref 22–32)
Calcium: 9.5 mg/dL (ref 8.9–10.3)
Creatinine, Ser: 2.28 mg/dL — ABNORMAL HIGH (ref 0.44–1.00)
GFR calc Af Amer: 22 mL/min — ABNORMAL LOW (ref >60)
GFR calc non Af Amer: 19 mL/min — ABNORMAL LOW (ref >60)
Glucose, Bld: 182 mg/dL — ABNORMAL HIGH (ref 70–99)
POTASSIUM: 4 mmol/L (ref 3.5–5.1)
Sodium: 134 mmol/L — ABNORMAL LOW (ref 135–145)

## 2018-07-31 LAB — HEMOGLOBIN AND HEMATOCRIT, BLOOD
HCT: 30.4 % — ABNORMAL LOW (ref 36.0–46.0)
Hemoglobin: 9.8 g/dL — ABNORMAL LOW (ref 12.0–15.0)

## 2018-07-31 LAB — GLUCOSE, CAPILLARY
Glucose-Capillary: 149 mg/dL — ABNORMAL HIGH (ref 70–99)
Glucose-Capillary: 162 mg/dL — ABNORMAL HIGH (ref 70–99)
Glucose-Capillary: 177 mg/dL — ABNORMAL HIGH (ref 70–99)
Glucose-Capillary: 186 mg/dL — ABNORMAL HIGH (ref 70–99)
Glucose-Capillary: 194 mg/dL — ABNORMAL HIGH (ref 70–99)

## 2018-07-31 MED ORDER — PANTOPRAZOLE SODIUM 40 MG PO TBEC: 20.0000 mg | DELAYED_RELEASE_TABLET | Freq: Two times a day (BID) | ORAL | Status: DC

## 2018-07-31 MED ORDER — HYDROCORTISONE ACETATE 25 MG RE SUPP: 25.0000 mg | Freq: Two times a day (BID) | RECTAL | 0 refills | Status: AC

## 2018-07-31 MED ORDER — PANTOPRAZOLE SODIUM 40 MG PO TBEC: 40.0000 mg | DELAYED_RELEASE_TABLET | Freq: Two times a day (BID) | ORAL | Status: AC

## 2018-07-31 MED ORDER — TRAMADOL HCL 50 MG PO TABS: 25.0000 mg | ORAL_TABLET | Freq: Two times a day (BID) | ORAL | 0 refills | Status: AC | PRN

## 2018-07-31 NOTE — TOC Transition Note (Signed)
Transition of Care [facility]) - CM/SW Discharge Note   Patient Details  Name: [name] MRN: [id number] Date of Birth: Dec 19, 1928  [facility]) CM/SW Contact:  Eileen Stanford, LCSW Phone Number: 07/31/2018, 11:11 AM   Clinical Narrative:   Clinical Social Worker facilitated patient discharge including contacting patient family and facility to confirm patient discharge plans.  Clinical information faxed to facility and family agreeable with plan.  CSW arranged ambulance transport via [facility] to [facility], room 504A.  RN to call [phone] for report prior to discharge.  Clinical Social Worker will sign off for now as social work intervention is no longer needed. Please consult [place] again if new need arises.      Final next level of care: Skilled Nursing Facility Barriers to Discharge: Ship broker, Continued Medical Work up   Patient Goals and CMS Choice Patient states their goals for this hospitalization and ongoing recovery are:: To return to ALF CMS Medicare.gov Compare Post Acute Care list provided to:: Patient Choice offered to / list presented to : Patient  Discharge Placement   Existing PASRR number confirmed : 07/25/18          Patient chooses bed at: [place] Patient to be transferred to facility by: [place] Name of family member notified: pt alert and oriented Patient and family notified of of transfer: 07/31/18  Discharge Plan and Services   Post Acute Care Choice: [place]                    Social Determinants of Health (SDOH) Interventions     Readmission Risk Interventions Readmission Risk Prevention Plan 07/28/2018 07/25/2018  Transportation Screening - Complete  PCP or Specialist Appt within 3-5 Days - Complete  HRI or Outlook - Not Complete  HRI or Home Care Consult comments - N/A  Social Work Consult for [place] Planning/Counseling - Complete  Palliative Care Screening  - Not Complete  Palliative Care Screening Not Complete Comments - N/A  Medication Review Press photographer) - Complete  SW Recovery Care/Counseling Consult Complete -  [place] Complete -  Some recent data might be hidden

## 2018-07-31 NOTE — Care Management Important Message (Signed)
Important Message  Patient Details  Name: [name] MRN: [id number] Date of Birth: 08/14/1928   Medicare Important Message Given:  Yes    Orbie Pyo 07/31/2018, 12:23 PM

## 2018-07-31 NOTE — Discharge Instructions (Signed)
Gastrointestinal Bleeding ° °Gastrointestinal bleeding is bleeding somewhere along the path food travels through the body (digestive tract). This path is anywhere between the mouth and the opening of the butt (anus). You may have blood in your poop (stools) or have black poop. If you throw up (vomit), there may be blood in it. °This condition can be mild, serious, or even life-threatening. If you have a lot of bleeding, you may need to stay in the hospital. °Follow these instructions at home: °· Take over-the-counter and prescription medicines only as told by your doctor. °· Eat foods that have a lot of fiber in them. These foods include whole grains, fruits, and vegetables. You can also try eating 1-3 prunes each day. °· Drink enough fluid to keep your pee (urine) clear or pale yellow. °· Keep all follow-up visits as told by your doctor. This is important. °Contact a doctor if: °· Your symptoms do not get better. °Get help right away if: °· Your bleeding gets worse. °· You feel dizzy or you pass out (faint). °· You feel weak. °· You have very bad cramps in your back or belly (abdomen). °· You pass large clumps of blood (clots) in your poop. °· Your symptoms are getting worse. °This information is not intended to replace advice given to you by your health care provider. Make sure you discuss any questions you have with your health care provider. °Document Released: 02/07/2008 Document Revised: 10/06/2015 Document Reviewed: 10/18/2014 °[employer] Interactive Patient Education © 2019 [employer] ° °

## 2018-07-31 NOTE — Discharge Summary (Signed)
Physician Discharge Summary  [name] AST:[id number] DOB: 1928-10-08 DOA: 07/23/2018  PCP: Josetta Huddle, MD  Admit date: 07/23/2018 Discharge date: 07/31/2018  Time spent: 45 minutes  Recommendations for Outpatient Follow-up:  Patient will be discharged to skilled nursing facility.  Patient will need to follow up with primary care provider within one week of discharge, repeat CBC.  Patient should continue medications as prescribed.  Patient should follow a soft diet.   Discharge Diagnoses:  Acute GI bleed/BRBPR/Macrocytic Anemia Chronic diastolic heart failure Acute kidney injury on chronic kidney disease, stage IV Insulin-dependent diabetes mellitus Depression/anxiety Hyperkalemia Gout  History of endometrial cancer Chronic respiratory failure with hypoxia Hyperbilirubinemia Obesity  Discharge Condition: stable  Diet recommendation: soft  Filed Weights   07/29/18 0408 07/30/18 0418 07/31/18 0407  Weight: 69.3 kg 62.3 kg 61.4 kg    History of present illness:  On 07/23/2018 by Dr. Katharina Caper V Dillonis a 85 y.o.femalewith medical history significant foruterine cancer status post hysterectomy and radiation, chronic diastolic CHF, insulin-dependent diabetes mellitus, chronic kidney disease stage IV, depression with anxiety, and chronic microcytic anemia, now presenting to the emergency department for evaluation of bleeding that began yesterday afternoon.Patient reports a history of frequent dark red and maroon blood in her bowel movements which she had been told was due to hemorrhoids, and reports that she had acute increase in bleeding yesterday afternoon, describing dark and maroon blood "pouring out." She thought that the blood was coming from her rectum, but was noted to have blood and clots at the introitus in the ED, was asked whether the bleeding she has been having was coming from the vagina or rectum, and she was not sure. She denies any abdominal  pain. She denies any vomiting. No recent fevers, chills, chest pain, or cough.  Hospital Course:  Acute GI bleed/BRBPR/Macrocytic Anemia -Resented with dark red blood/maroon blood in her bowel movements -Pelvic ultrasound showed no fistula or abnormal fluid.  CT abdomen pelvis showed no acute findings -Gastroenterology consulted and appreciated, however family declined colonoscopy -Patient's hemoglobin dropped to 5.8 -She has received a total of 4 units PRBC, 2 FFP's, 1 pooled platelets -Hemoglobin currently 9.8 -Patient did have NM GI bleeding scan which is positive, most likely site of bleeding within the proximal to mid transverse colon -Interventional radiology consulted and appreciated-status post mesenteric angiogram to include celiac, SMA, IMA, all were patent, no findings to justify empiric or targeted embolization of lower GI bleed -GI and IR recommending conservative management at this time.  Difficult situation given patient's bleeding is intermittent and she did have worsening of her renal function.  No plan for repeat angiogram or empiric embolization.  Recommended palliative care consultation -Continue iron supplementation, PPI, hydrocortisone -Repeat CBC  Chronic diastolic heart failure -Patient appears to be compensated and euvolemic -Monitor intake and output, daily weights -Torsemide held- resume on discharge  Acute kidney injury on chronic kidney disease, stage IV -Likely worsened given torsemide as well as contrast for angiogram -Was given normal saline -Creatinine currently 2.28  Insulin-dependent diabetes mellitus -Hemoglobin A1c 6.5 on 07/21/2017 -Continue insulin sliding scale CBG monitoring  Depression/anxiety -Stable, continue Depakote, trazodone  Hyperkalemia -Suspect secondary to blood transfusions, acute kidney injury -resolved  Gout  -allopurinol held- may resume at discharge  History of endometrial cancer -Status post hysterectomy,  radiation therapy with external beam and then brachytherapy  Chronic respiratory failure with hypoxia -Continue nebulizer treatments as needed  Hyperbilirubinemia -Unclear etiology but is improved -Resolved  Obesity  Code  status: DNR  Consultants Gastroenterology Interventional radiology Palliative care  Procedures  Ultrasound-guided right CFA access with mesenteric angiogram to include celiac, SMA, IMA  Discharge Exam: Vitals:   07/30/18 2338 07/31/18 0755  BP: (!) 172/53 (!) 164/57  Pulse: 78 73  Resp:    Temp:  99.4 F (37.4 C)  SpO2: 97% 91%     General: Well developed, elderly, NAD  HEENT: NCAT, mucous membranes moist.  Cardiovascular: S1 S2 auscultated, RRR  Respiratory: Clear to auscultation bilaterally   Abdomen: Soft, nontender, nondistended, + bowel sounds  Extremities: warm dry without cyanosis clubbing or edema  Neuro: AAOx3, nonfocal  Psych: pleasant, appropriate mood and affect  Discharge Instructions Discharge Instructions    Discharge instructions   Complete by:  As directed    Patient will be discharged to skilled nursing facility.  Patient will need to follow up with primary care provider within one week of discharge.  Patient should continue medications as prescribed.  Patient should follow a soft diet.     Allergies as of 07/31/2018      Reactions   Penicillins Other (See Comments)   Is ALLERGIC, per [facility]      Medication List    STOP taking these medications   Biofreeze Roll-On 4 % Gel Generic drug:  Menthol (Topical Analgesic)   Deep Sea Nasal Spray 0.65 % nasal spray Generic drug:  sodium chloride   Dextromethorphan-guaiFENesin 10-100 MG/5ML liquid   Levemir FlexTouch 100 UNIT/ML Pen Generic drug:  Insulin Detemir   PRESCRIPTION MEDICATION   senna-docusate 8.6-50 MG tablet Commonly known as:  Senokot-S   simethicone 80 MG chewable tablet Commonly known as:  MYLICON   tetrahydrozoline 0.05 % ophthalmic  solution     TAKE these medications   acetaminophen 325 MG tablet Commonly known as:  TYLENOL Take 650 mg by mouth See admin instructions. Take 650 mg by mouth three times a day and 650 mg every 8 hours as needed for pain   albuterol (2.5 MG/3ML) 0.083% nebulizer solution Commonly known as:  PROVENTIL Take 2.5 mg by nebulization every 2 (two) hours as needed for wheezing.   allopurinol 300 MG tablet Commonly known as:  ZYLOPRIM Take 150 mg by mouth at bedtime.   Basaglar KwikPen 100 UNIT/ML Sopn Inject 55 Units into the skin at bedtime.   bisoprolol 10 MG tablet Commonly known as:  ZEBETA Take 1 tablet (10 mg total) by mouth daily.   Carboxymethylcellulose Sod PF 0.5 % Soln Place 1 drop into both eyes 2 (two) times daily.   divalproex 125 MG DR tablet Commonly known as:  DEPAKOTE Take 250 mg by mouth daily after breakfast. What changed:  Another medication with the same name was changed. Make sure you understand how and when to take each.   divalproex 125 MG capsule Commonly known as:  DEPAKOTE SPRINKLE 250 mg every morning and 125 mg in the afternoon What changed:    how much to take  how to take this  when to take this  additional instructions   ferrous sulfate 325 (65 FE) MG tablet Take 325 mg by mouth daily with breakfast.   FLUoxetine 20 MG tablet Commonly known as:  PROZAC Take 30 mg by mouth daily.   fluticasone 50 MCG/ACT nasal spray Commonly known as:  FLONASE Place 1 spray into both nostrils daily.   gabapentin 400 MG capsule Commonly known as:  NEURONTIN Take 400 mg by mouth at bedtime.   gabapentin 100 MG capsule Commonly known  as:  NEURONTIN Take 200 mg by mouth 2 (two) times daily.   hydrALAZINE 25 MG tablet Commonly known as:  APRESOLINE Take 1 tablet (25 mg total) by mouth 3 (three) times daily. What changed:  when to take this   hydrocortisone 25 MG suppository Commonly known as:  ANUSOL-HC Place 1 suppository (25 mg total)  rectally 2 (two) times daily for 6 days.   insulin aspart 100 UNIT/ML injection Commonly known as:  novoLOG Inject 0-15 Units into the skin 3 (three) times daily with meals. What changed:    how much to take  when to take this  additional instructions   ipratropium-albuterol 0.5-2.5 (3) MG/3ML Soln Commonly known as:  DUONEB Take 3 mLs by nebulization 3 (three) times daily.   linagliptin 5 MG Tabs tablet Commonly known as:  TRADJENTA Take 5 mg by mouth daily.   magnesium oxide 400 MG tablet Commonly known as:  MAG-OX Take 400 mg by mouth daily.   NON FORMULARY Take 120 mLs by mouth See admin instructions. MedPass; Drink 120 ml's by mouth once a day   Olopatadine HCl 0.2 % Soln Place 1 drop into both eyes daily.   OXYGEN Inhale 2 L into the lungs as needed (FOR SATS <90%).   pantoprazole 40 MG tablet Commonly known as:  PROTONIX Take 1 tablet (40 mg total) by mouth 2 (two) times daily. What changed:    medication strength  how much to take   predniSONE 20 MG tablet Commonly known as:  Deltasone Take 1 tablet (20 mg total) by mouth daily with breakfast.   rOPINIRole 1 MG tablet Commonly known as:  REQUIP Take 1 mg by mouth 2 (two) times daily.   torsemide 20 MG tablet Commonly known as:  DEMADEX Take 2 tablets (40 mg total) by mouth daily.   traMADol 50 MG tablet Commonly known as:  ULTRAM Take 0.5 tablets (25 mg total) by mouth every 12 (twelve) hours as needed (for pain).   traZODone 50 MG tablet Commonly known as:  DESYREL Take 50 mg by mouth at bedtime.   vitamin B-12 1000 MCG tablet Commonly known as:  CYANOCOBALAMIN Take 1,000 mcg by mouth daily.   Vitamin D3 50 MCG (2000 UT) Tabs Take 8,000 Units by mouth daily.      Allergies  Allergen Reactions   Penicillins Other (See Comments)    Is ALLERGIC, per [facility]   Follow-up Information    Josetta Huddle, MD. Schedule an appointment as soon as possible for a visit in 1 week(s).   Specialty:   Internal Medicine Why:  Hospital follow up Contact information: [address] [place] [address] [place] [address] [phone]            The results of significant diagnostics from this hospitalization (including imaging, microbiology, ancillary and laboratory) are listed below for reference.    Significant Diagnostic Studies: Ct Abdomen Pelvis Wo Contrast  Result Date: 07/23/2018 CLINICAL DATA:  Vaginal bleeding EXAM: CT ABDOMEN AND PELVIS WITHOUT CONTRAST TECHNIQUE: Multidetector CT imaging of the abdomen and pelvis was performed following the standard protocol without IV contrast. COMPARISON:  07/19/2017 FINDINGS: Lower chest:  Extensive atherosclerotic calcification Hepatobiliary: Hepatic steatosis.Cholelithiasis without evidence of inflammation or biliary obstruction. Pancreas: Unremarkable. Spleen: Unremarkable. Adrenals/Urinary Tract: Negative adrenals. No hydronephrosis or ureteral stone. Right calcifications are at least predominantly vascular. Unremarkable bladder. Stomach/Bowel: Diffuse formed stool. No bowel obstruction. No evidence of bowel inflammation. Vascular/Lymphatic: Extensive atherosclerotic calcification. Focal calcification of a peripheral ileocolic vessels is  chronic. No mass or adenopathy. There is retroperitoneal stranding at the level of the pelvis that is stable from prior. Suspect this is related patient's history of radiotherapy. Reproductive:Hysterectomy.  No visualized ovaries. Other: No ascites or pneumoperitoneum. Musculoskeletal: No acute abnormalities. Disc narrowing and facet arthropathy with L4-5 anterolisthesis. IMPRESSION: 1. No acute finding or change from 07/19/2017. 2. Hysterectomy. 3. Hepatic steatosis and cholelithiasis. Electronically Signed   By: Monte Fantasia M.D.   On: 07/23/2018 04:23   Nm Gi Blood Loss  Result Date: 07/25/2018 CLINICAL DATA:  Active rectal bleed/ eval for bleeding origin. EXAM: NUCLEAR MEDICINE GASTROINTESTINAL BLEEDING  SCAN TECHNIQUE: Sequential abdominal images were obtained following intravenous administration of Tc-63m labeled red blood cells. RADIOPHARMACEUTICALS:  26.3 mCi Tc-9m pertechnetate in-vitro labeled red cells. COMPARISON:  CT of the abdomen and pelvis on 07/23/2018 FINDINGS: There is abnormal activity within the proximal to mid transverse colon, leading to intraluminal activity within the distal transverse colon, splenic flexure, and proximal descending colon. Expected blood pool activity elsewhere. IMPRESSION: Most likely site of bleeding is within the proximal to mid transverse colon. These results were called by telephone at the time of interpretation on 07/25/2018 at 1:32 pm to Dr. Watt Climes, who verbally acknowledged these results. The findings were discussed with Dr. Earleen Newport at 13:54. Electronically Signed   By: Nolon Nations M.D.   On: 07/25/2018 13:57   Ir Angiogram Visceral Selective  Result Date: 07/28/2018 INDICATION: 85 year old female with a history positive GI bleeding study, anemia, bright red blood per rectum/melena. EXAM: ULTRASOUND GUIDED ACCESS RIGHT COMMON FEMORAL ARTERY MESENTERIC ANGIOGRAM MEDICATIONS: None ANESTHESIA/SEDATION: Moderate (conscious) sedation was not employed during this procedure. A total of Versed 0.5 mg and Fentanyl 0 mcg was administered intravenously. Moderate Sedation Time: 0 minutes. The patient's level of consciousness and vital signs were monitored continuously by radiology nursing throughout the procedure under my direct supervision. CONTRAST:  60 cc FLUOROSCOPY TIME:  Fluoroscopy Time: 11 minutes 54 seconds (204 mGy). COMPLICATIONS: None PROCEDURE: Informed consent was obtained from the patient following explanation of the procedure, risks, benefits and alternatives. The patient understands, agrees and consents for the procedure. All questions were addressed. A time out was performed prior to the initiation of the procedure. Maximal barrier sterile technique utilized  including caps, mask, sterile gowns, sterile gloves, large sterile drape, hand hygiene, and Betadine prep. Ultrasound survey of the right inguinal region was performed with images stored and sent to PACs, confirming patency of the vessel. A micropuncture needle was used access the right common femoral artery under ultrasound. With excellent arterial blood flow returned, and an .018 micro wire was passed through the needle, observed enter the abdominal aorta under fluoroscopy. The needle was removed, and a micropuncture sheath was placed over the wire. The inner dilator and wire were removed, and an 035 Bentson wire was advanced under fluoroscopy into the abdominal aorta. The sheath was removed and a standard 5 [place] vascular sheath was placed. The dilator was removed and the sheath was flushed. C2 cobra catheter was advanced on the Bentson wire into the abdominal aorta. SMA was selected. Angiogram was performed. C2 catheter was then used in attempt to select the inferior mesenteric artery origin. Unfavorable atherosclerotic changes, caliber of aorta, and tortuosity. The C2 catheter was then exchanged for a rim catheter. This was unsuccessful in selecting the IMA origin. Chung catheter was then exchanged, formed in the thoracic aorta, and advanced inferiorly. We found success with engaging the IMA origin with the chung catheter. Angiogram was  performed. The chung catheter was then advanced to the celiac artery origin and a final angiogram was performed at the celiac artery origin. After all images were thoroughly reviewed, we withdrew having discovered no evidence hemorrhage for embolization. We elected not to empirically embolize any of the branches to the transverse colon. Exoseal was deployed at the right common femoral artery. Patient tolerated the procedure well and remained hemodynamically stable throughout. No complications were encountered and no significant blood loss. FINDINGS: Ultrasound demonstrates  patent right common femoral artery Extensive atherosclerotic changes of the abdominal aorta and the bilateral iliac arteries. No aneurysm. 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 There is adequate filling of the arcade branches, ileo colic artery, with no pooling of contrast, extravasation, tumor blush, or significant angio dysplasia. A spot image which was acquired directly following the injection of the SMA demonstrates no puddling of contrast. IMA angiogram demonstrates significant atherosclerotic changes in the distribution of the superior hemorrhoidal artery. No pooling of contrast, extravasation, tumor blush, or significant angio dysplasia. A spot image acquired directly following the injection of the SMA demonstrates filling of the inferior mesenteric vein, with no puddling of contrast/contrast stain. Angiogram of the celiac artery demonstrates patent celiac with patent branches including splenic artery, left gastric artery, common hepatic artery. GDA is patent, and is continuous with the gastroepiploic artery. No evidence active extravasation, pooling of contrast, tumor blush. IMPRESSION: Status post ultrasound-guided right common femoral artery angiogram and complete mesenteric angiogram, with no findings of gastrointestinal bleeding, and no findings that would warrant empiric embolization of the lower GI tract. Signed, Dulcy Fanny. Dellia Nims, RPVI [facility] PLAN: If the patient has continued bleeding, surgical consultation may be warranted. If the patient is not a surgical candidate and there is ongoing bleeding, repeat mesenteric angiogram may be considered, although this would require more contrast administration and put the patient had significant risk for acute renal failure/contrast induced nephropathy. A risk benefit analysis  including consideration of palliative care may be considered at that time. Additionally if the patient has ongoing bleeding, a repeat nuclear study may be considered. Electronically Signed   By: Corrie Mckusick D.O.   On: 07/28/2018 08:33   Ir Angiogram Visceral Selective  Result Date: 07/28/2018 INDICATION: 85 year old female with a history positive GI bleeding study, anemia, bright red blood per rectum/melena. EXAM: ULTRASOUND GUIDED ACCESS RIGHT COMMON FEMORAL ARTERY MESENTERIC ANGIOGRAM MEDICATIONS: None ANESTHESIA/SEDATION: Moderate (conscious) sedation was not employed during this procedure. A total of Versed 0.5 mg and Fentanyl 0 mcg was administered intravenously. Moderate Sedation Time: 0 minutes. The patient's level of consciousness and vital signs were monitored continuously by radiology nursing throughout the procedure under my direct supervision. CONTRAST:  60 cc FLUOROSCOPY TIME:  Fluoroscopy Time: 11 minutes 54 seconds (204 mGy). COMPLICATIONS: None PROCEDURE: Informed consent was obtained from the patient following explanation of the procedure, risks, benefits and alternatives. The patient understands, agrees and consents for the procedure. All questions were addressed. A time out was performed prior to the initiation of the procedure. Maximal barrier sterile technique utilized including caps, mask, sterile gowns, sterile gloves, large sterile drape, hand hygiene, and Betadine prep. Ultrasound survey of the right inguinal region was performed with images stored and sent to PACs, confirming patency of the vessel. A micropuncture needle was used access the right common femoral artery under ultrasound. With excellent arterial blood  flow returned, and an .018 micro wire was passed through the needle, observed enter the abdominal aorta under fluoroscopy. The needle was removed, and a micropuncture sheath was placed over the wire. The inner dilator and wire were removed, and an 035 Bentson wire was  advanced under fluoroscopy into the abdominal aorta. The sheath was removed and a standard 5 [place] vascular sheath was placed. The dilator was removed and the sheath was flushed. C2 cobra catheter was advanced on the Bentson wire into the abdominal aorta. SMA was selected. Angiogram was performed. C2 catheter was then used in attempt to select the inferior mesenteric artery origin. Unfavorable atherosclerotic changes, caliber of aorta, and tortuosity. The C2 catheter was then exchanged for a rim catheter. This was unsuccessful in selecting the IMA origin. Chung catheter was then exchanged, formed in the thoracic aorta, and advanced inferiorly. We found success with engaging the IMA origin with the chung catheter. Angiogram was performed. The chung catheter was then advanced to the celiac artery origin and a final angiogram was performed at the celiac artery origin. After all images were thoroughly reviewed, we withdrew having discovered no evidence hemorrhage for embolization. We elected not to empirically embolize any of the branches to the transverse colon. Exoseal was deployed at the right common femoral artery. Patient tolerated the procedure well and remained hemodynamically stable throughout. No complications were encountered and no significant blood loss. FINDINGS: Ultrasound demonstrates patent right common femoral artery Extensive atherosclerotic changes of the abdominal aorta and the bilateral iliac arteries. No aneurysm. 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 There is adequate filling of the arcade branches, ileo colic artery, with no pooling of contrast, extravasation, tumor blush, or significant angio dysplasia. A spot image which was acquired directly following the injection of the SMA demonstrates no puddling of contrast. IMA angiogram demonstrates significant atherosclerotic changes in  the distribution of the superior hemorrhoidal artery. No pooling of contrast, extravasation, tumor blush, or significant angio dysplasia. A spot image acquired directly following the injection of the SMA demonstrates filling of the inferior mesenteric vein, with no puddling of contrast/contrast stain. Angiogram of the celiac artery demonstrates patent celiac with patent branches including splenic artery, left gastric artery, common hepatic artery. GDA is patent, and is continuous with the gastroepiploic artery. No evidence active extravasation, pooling of contrast, tumor blush. IMPRESSION: Status post ultrasound-guided right common femoral artery angiogram and complete mesenteric angiogram, with no findings of gastrointestinal bleeding, and no findings that would warrant empiric embolization of the lower GI tract. Signed, Dulcy Fanny. Dellia Nims, RPVI [facility] PLAN: If the patient has continued bleeding, surgical consultation may be warranted. If the patient is not a surgical candidate and there is ongoing bleeding, repeat mesenteric angiogram may be considered, although this would require more contrast administration and put the patient had significant risk for acute renal failure/contrast induced nephropathy. A risk benefit analysis including consideration of palliative care may be considered at that time. Additionally if the patient has ongoing bleeding, a repeat nuclear study may be considered. Electronically Signed   By: Corrie Mckusick D.O.   On: 07/28/2018 08:33   Ir Angiogram Visceral Selective  Result Date: 07/28/2018 INDICATION: 85 year old female with a history positive GI bleeding study, anemia, bright red blood per rectum/melena. EXAM: ULTRASOUND GUIDED ACCESS RIGHT COMMON FEMORAL ARTERY MESENTERIC ANGIOGRAM MEDICATIONS: None ANESTHESIA/SEDATION: Moderate (conscious) sedation was not  employed during this procedure. A total of Versed 0.5 mg and  Fentanyl 0 mcg was administered intravenously. Moderate Sedation Time: 0 minutes. The patient's level of consciousness and vital signs were monitored continuously by radiology nursing throughout the procedure under my direct supervision. CONTRAST:  60 cc FLUOROSCOPY TIME:  Fluoroscopy Time: 11 minutes 54 seconds (204 mGy). COMPLICATIONS: None PROCEDURE: Informed consent was obtained from the patient following explanation of the procedure, risks, benefits and alternatives. The patient understands, agrees and consents for the procedure. All questions were addressed. A time out was performed prior to the initiation of the procedure. Maximal barrier sterile technique utilized including caps, mask, sterile gowns, sterile gloves, large sterile drape, hand hygiene, and Betadine prep. Ultrasound survey of the right inguinal region was performed with images stored and sent to PACs, confirming patency of the vessel. A micropuncture needle was used access the right common femoral artery under ultrasound. With excellent arterial blood flow returned, and an .018 micro wire was passed through the needle, observed enter the abdominal aorta under fluoroscopy. The needle was removed, and a micropuncture sheath was placed over the wire. The inner dilator and wire were removed, and an 035 Bentson wire was advanced under fluoroscopy into the abdominal aorta. The sheath was removed and a standard 5 [place] vascular sheath was placed. The dilator was removed and the sheath was flushed. C2 cobra catheter was advanced on the Bentson wire into the abdominal aorta. SMA was selected. Angiogram was performed. C2 catheter was then used in attempt to select the inferior mesenteric artery origin. Unfavorable atherosclerotic changes, caliber of aorta, and tortuosity. The C2 catheter was then exchanged for a rim catheter. This was unsuccessful in selecting the IMA origin. Chung catheter was then exchanged, formed in the thoracic aorta, and advanced  inferiorly. We found success with engaging the IMA origin with the chung catheter. Angiogram was performed. The chung catheter was then advanced to the celiac artery origin and a final angiogram was performed at the celiac artery origin. After all images were thoroughly reviewed, we withdrew having discovered no evidence hemorrhage for embolization. We elected not to empirically embolize any of the branches to the transverse colon. Exoseal was deployed at the right common femoral artery. Patient tolerated the procedure well and remained hemodynamically stable throughout. No complications were encountered and no significant blood loss. FINDINGS: Ultrasound demonstrates patent right common femoral artery Extensive atherosclerotic changes of the abdominal aorta and the bilateral iliac arteries. No aneurysm. 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 There is adequate filling of the arcade branches, ileo colic artery, with no pooling of contrast, extravasation, tumor blush, or significant angio dysplasia. A spot image which was acquired directly following the injection of the SMA demonstrates no puddling of contrast. IMA angiogram demonstrates significant atherosclerotic changes in the distribution of the superior hemorrhoidal artery. No pooling of contrast, extravasation, tumor blush, or significant angio dysplasia. A spot image acquired directly following the injection of the SMA demonstrates filling of the inferior mesenteric vein, with no puddling of contrast/contrast stain. Angiogram of the celiac artery demonstrates patent celiac with patent branches including splenic artery, left gastric artery, common hepatic artery. GDA is patent, and is continuous with the gastroepiploic artery. No evidence active extravasation, pooling of contrast, tumor blush. IMPRESSION: Status post ultrasound-guided right common  femoral artery angiogram and complete mesenteric angiogram, with no findings of gastrointestinal bleeding, and no findings  that would warrant empiric embolization of the lower GI tract. Signed, Dulcy Fanny. Dellia Nims, RPVI [facility] PLAN: If the patient has continued bleeding, surgical consultation may be warranted. If the patient is not a surgical candidate and there is ongoing bleeding, repeat mesenteric angiogram may be considered, although this would require more contrast administration and put the patient had significant risk for acute renal failure/contrast induced nephropathy. A risk benefit analysis including consideration of palliative care may be considered at that time. Additionally if the patient has ongoing bleeding, a repeat nuclear study may be considered. Electronically Signed   By: Corrie Mckusick D.O.   On: 07/28/2018 08:33   Ir US Guide Vasc Access Right  Result Date: 07/28/2018 INDICATION: 85 year old female with a history positive GI bleeding study, anemia, bright red blood per rectum/melena. EXAM: ULTRASOUND GUIDED ACCESS RIGHT COMMON FEMORAL ARTERY MESENTERIC ANGIOGRAM MEDICATIONS: None ANESTHESIA/SEDATION: Moderate (conscious) sedation was not employed during this procedure. A total of Versed 0.5 mg and Fentanyl 0 mcg was administered intravenously. Moderate Sedation Time: 0 minutes. The patient's level of consciousness and vital signs were monitored continuously by radiology nursing throughout the procedure under my direct supervision. CONTRAST:  60 cc FLUOROSCOPY TIME:  Fluoroscopy Time: 11 minutes 54 seconds (204 mGy). COMPLICATIONS: None PROCEDURE: Informed consent was obtained from the patient following explanation of the procedure, risks, benefits and alternatives. The patient understands, agrees and consents for the procedure. All questions were addressed. A time out was performed prior to the initiation of the procedure.  Maximal barrier sterile technique utilized including caps, mask, sterile gowns, sterile gloves, large sterile drape, hand hygiene, and Betadine prep. Ultrasound survey of the right inguinal region was performed with images stored and sent to PACs, confirming patency of the vessel. A micropuncture needle was used access the right common femoral artery under ultrasound. With excellent arterial blood flow returned, and an .018 micro wire was passed through the needle, observed enter the abdominal aorta under fluoroscopy. The needle was removed, and a micropuncture sheath was placed over the wire. The inner dilator and wire were removed, and an 035 Bentson wire was advanced under fluoroscopy into the abdominal aorta. The sheath was removed and a standard 5 [place] vascular sheath was placed. The dilator was removed and the sheath was flushed. C2 cobra catheter was advanced on the Bentson wire into the abdominal aorta. SMA was selected. Angiogram was performed. C2 catheter was then used in attempt to select the inferior mesenteric artery origin. Unfavorable atherosclerotic changes, caliber of aorta, and tortuosity. The C2 catheter was then exchanged for a rim catheter. This was unsuccessful in selecting the IMA origin. Chung catheter was then exchanged, formed in the thoracic aorta, and advanced inferiorly. We found success with engaging the IMA origin with the chung catheter. Angiogram was performed. The chung catheter was then advanced to the celiac artery origin and a final angiogram was performed at the celiac artery origin. After all images were thoroughly reviewed, we withdrew having discovered no evidence hemorrhage for embolization. We elected not to empirically embolize any of the branches to the transverse colon. Exoseal was deployed at the right common femoral artery. Patient tolerated the procedure well and remained hemodynamically stable throughout. No complications were encountered and no significant blood  loss. FINDINGS: Ultrasound demonstrates patent right common femoral artery Extensive atherosclerotic changes of the abdominal aorta and the bilateral iliac arteries. No aneurysm. In fact the distal aorta it is quite small caliber with irregularity of  the intima SMA angiogram demonstrates atonic bowel, predominantly in the pelvis. There is adequate filling of the arcade branches, ileo colic artery, with no pooling of contrast, extravasation, tumor blush, or significant angio dysplasia. A spot image which was acquired directly following the injection of the SMA demonstrates no puddling of contrast. IMA angiogram demonstrates significant atherosclerotic changes in the distribution of the superior hemorrhoidal artery. No pooling of contrast, extravasation, tumor blush, or significant angio dysplasia. A spot image acquired directly following the injection of the SMA demonstrates filling of the inferior mesenteric vein, with no puddling of contrast/contrast stain. Angiogram of the celiac artery demonstrates patent celiac with patent branches including splenic artery, left gastric artery, common hepatic artery. GDA is patent, and is continuous with the gastroepiploic artery. No evidence active extravasation, pooling of contrast, tumor blush. IMPRESSION: Status post ultrasound-guided right common femoral artery angiogram and complete mesenteric angiogram, with no findings of gastrointestinal bleeding, and no findings that would warrant empiric embolization of the lower GI tract. Signed, Dulcy Fanny. Dellia Nims, RPVI [facility] PLAN: If the patient has continued bleeding, surgical consultation may be warranted. If the patient is not a surgical candidate and there is ongoing bleeding, repeat mesenteric angiogram may be considered, although this would require more contrast administration and put the patient had significant risk for acute renal failure/contrast induced  nephropathy. A risk benefit analysis including consideration of palliative care may be considered at that time. Additionally if the patient has ongoing bleeding, a repeat nuclear study may be considered. Electronically Signed   By: Corrie Mckusick D.O.   On: 07/28/2018 08:33   US Pelvic Complete With Transvaginal  Result Date: 07/23/2018 CLINICAL DATA:  Postmenopausal vaginal bleeding. Previous hysterectomy. EXAM: TRANSABDOMINAL AND TRANSVAGINAL ULTRASOUND OF PELVIS TECHNIQUE: Both transabdominal and transvaginal ultrasound examinations of the pelvis were performed. Transabdominal technique was performed for global imaging of the pelvis including uterus, ovaries, adnexal regions, and pelvic cul-de-sac. It was necessary to proceed with endovaginal exam following the transabdominal exam to visualize the ovaries. COMPARISON:  None FINDINGS: Uterus Uterus is surgically absent. Endometrium Surgically absent. Right ovary Not visualized. Left ovary Not visualized. Other findings No abnormal free fluid. Filled bladder is present without filling defect or wall thickening. IMPRESSION: Uterus is surgically absent. Ovaries are not visualized but no mass or abnormal fluid demonstrated in the visualized adnexal regions. Electronically Signed   By: Lucienne Capers M.D.   On: 07/23/2018 03:31    Microbiology: Recent Results (from the past 240 hour(s))  MRSA PCR Screening     Status: None   Collection Time: 07/24/18  4:29 AM  Result Value Ref Range Status   MRSA by PCR NEGATIVE NEGATIVE Final    Comment:        The GeneXpert MRSA Assay (FDA approved for NASAL specimens only), is one component of a comprehensive MRSA colonization surveillance program. It is not intended to diagnose MRSA infection nor to guide or monitor treatment for MRSA infections. Performed at [facility], [place] [address], [place], [place] [address]      Labs: Basic Metabolic Panel: Recent Labs  Lab 07/26/18 0332 07/27/18 0247  07/28/18 0730 07/29/18 0229 07/30/18 0224 07/31/18 0254  NA 135 135 138 137 139 134*  K 4.0 3.7 3.9 5.4* 4.0 4.0  CL 100 101 105 104 104 104  CO2 26 26 26 24 26  20*  GLUCOSE 169* 142* 140* 174* 159* 182*  BUN 50* 44* 42* 33* 27*  25*  CREATININE 3.31* 3.17* 2.99* 2.43* 2.28* 2.28*  CALCIUM 8.9 8.9 9.2 9.6 9.7 9.5  MG 1.8 1.8 1.8 1.9 1.7  --   PHOS 4.4 3.5 3.9 3.4 2.6  --    Liver Function Tests: Recent Labs  Lab 07/26/18 0332 07/27/18 0247 07/28/18 0730 07/29/18 0229 07/30/18 0224  AST 17 13* 15 36 18  ALT 21 17 17 14 13   ALKPHOS 47 51 51 60 47  BILITOT 1.9* 0.9 0.8 2.1* 0.9  PROT 5.4* 5.4* 5.8* 6.1* 5.6*  ALBUMIN 2.6* 2.6* 2.9* 3.1* 2.7*   No results for input(s): LIPASE, AMYLASE in the last 168 hours. No results for input(s): AMMONIA in the last 168 hours. CBC: Recent Labs  Lab 07/28/18 1734 07/28/18 2224 07/29/18 0229  07/29/18 1455 07/29/18 2114 07/30/18 0224 07/30/18 0917 07/30/18 1416 07/31/18 0254  WBC 5.1 7.6 8.4  --  7.8  --  7.6  --   --   --   NEUTROABS 3.4 5.2 5.3  --  5.2  --  4.4  --   --   --   HGB 7.5* 10.1* 10.6*   < > 10.5* 9.9* 9.6* 10.8* 10.7* 9.8*  HCT 22.7* 30.1* 30.4*   < > 30.9* 29.9* 28.4* 33.3* 31.8* 30.4*  MCV 89.4 88.3 88.9  --  89.8  --  91.3  --   --   --   PLT 184 258 266  --  256  --  253  --   --   --    < > = values in this interval not displayed.   Cardiac Enzymes: No results for input(s): CKTOTAL, CKMB, CKMBINDEX, TROPONINI in the last 168 hours. BNP: BNP (last 3 results) Recent Labs    11/14/17 1205  BNP 625.3*    ProBNP (last 3 results) No results for input(s): PROBNP in the last 8760 hours.  CBG: Recent Labs  Lab 07/30/18 1651 07/30/18 2001 07/30/18 2336 07/31/18 0340 07/31/18 0753  GLUCAP 168* 205* 177* 162* 149*       Signed:  Phelix Fudala  [facility] 07/31/2018, 10:18 AM

## 2018-08-04 ENCOUNTER — Non-Acute Institutional Stay: Payer: Medicare Other | Admitting: Adult Health Nurse Practitioner

## 2018-08-04 ENCOUNTER — Other Ambulatory Visit: Payer: Self-pay

## 2018-08-04 DIAGNOSIS — Z515 Encounter for palliative care: Secondary | ICD-10-CM

## 2018-08-04 NOTE — Progress Notes (Signed)
[facility] Consult Note Telephone: [phone]  Fax: [phone]  PATIENT NAME: [name] DOB: 08/16/1928 MRN: [id number]  PRIMARY CARE PROVIDER:   Josetta Huddle, MD  REFERRING PROVIDER:  Josetta Huddle, MD [address] [place], [place] [address]  RESPONSIBLE PARTY:     Extended Emergency Contact Information Primary Emergency Contact: Church,Carl Address: [address]           [place], [place] [address] [place] Phone: [phone] Relation: Son Secondary Emergency Contact: Tamera Reason Address: [address]          [place], [place] [address] [place] Phone: [phone] Relation: Daughter    RECOMMENDATIONS and PLAN:  1.  Goals of care.  DNR in place.  Patient is a do not hospitalize and on comfort measures according to MOST form.  Patient did have recent hospitalization for GI bleed.  After work up at hospital no apparent source of the bleeding was found.  Most likely source is believed to be the proximal to mid transverse colon.  Patient to continue on iron supplementation, PPI, and hydrocortisone.  Patient has no complaints of pain, constipation, weakness, and denies any episode of bleeding since returning to the facility.  Staff reports that she is doing well with no bleeding noted since her return.  States her appetite is good.  Does state she lost some weight while in the hospital.  Patient had 4 units of PRBC transfused in the hospital with her HGB/HCT at discharge 9.8/30.4.  Did not see any labs since patient has been back but she just returned on 3/19.  If no repeat CBC ordered may want to recheck it to make sure HGB and HCT are not dropping.  I spent 20 minutes providing this consultation,  from 2:00 to 2:20. More than 50% of the time in this consultation was spent coordinating communication.   HISTORY OF PRESENT ILLNESS:  [name] is a 85 y.o. year  old female with multiple medical problems including HTN,CAD,  gout,memory loss, DM. Palliative Care was asked to help address goals of care.   CODE STATUS: DNR  PPS: 30% HOSPICE ELIGIBILITY/DIAGNOSIS: TBD  PHYSICAL EXAM:   General: patient lying in bed in NAD Cardiovascular: regular rate and rhythm Pulmonary: lung sounds clear, normal respiratory effort Abdomen: soft, nontender, + bowel sounds GU: no suprapubic tenderness Extremities: no edema, wearing TED hose; arthritic changes noted to bilateral hands Skin: no rashes Neurological: Weakness; A&O x 3 today  PAST MEDICAL HISTORY:  Past Medical History:  Diagnosis Date  . Acute on chronic respiratory failure ([place])   . Anemia   . Cancer ([place]) 07/04/10   endometrial  . Chronic pain   . COPD (chronic obstructive pulmonary disease) ([place])   . Diabetes mellitus   . Dysphagia   . Falling episodes   . GERD (gastroesophageal reflux disease)   . Gout   . Hemiplegia and hemiparesis following cerebral infarction affecting left non-dominant side ([facility])   . Hx of radiation therapy 09/13/10- 10/19/10, 6/12, 6/19, 11/14/10   pelvis, external beam tehn brachytherapy  . Hypertension   . Parkinson's disease ([facility])   . Pneumonia   . Renal disorder   . Spondylosis without myelopathy or radiculopathy   . Stroke ([place])    L sided deficits w/slurred speech  . Unspecified right bundle-branch block     SOCIAL HX:  Social History   Tobacco Use  .  Smoking status: Former Smoker    Types: Cigarettes  . Smokeless tobacco: Never Used  . Tobacco comment: quit many yrs ago  Substance Use Topics  . Alcohol use: No    ALLERGIES:  Allergies  Allergen Reactions  . Penicillins Other (See Comments)    Is ALLERGIC, per MAR     PERTINENT MEDICATIONS:  Outpatient Encounter Medications as of 08/04/2018  Medication Sig  . acetaminophen (TYLENOL) 325 MG tablet Take 650 mg by mouth See admin instructions. Take 650 mg by mouth three times a day and 650 mg every  8 hours as needed for pain  . albuterol (PROVENTIL) (2.5 MG/3ML) 0.083% nebulizer solution Take 2.5 mg by nebulization every 2 (two) hours as needed for wheezing.  Marland Kitchen allopurinol (ZYLOPRIM) 300 MG tablet Take 150 mg by mouth at bedtime.  . bisoprolol (ZEBETA) 10 MG tablet Take 1 tablet (10 mg total) by mouth daily.  . Carboxymethylcellulose Sod PF 0.5 % SOLN Place 1 drop into both eyes 2 (two) times daily.  . Cholecalciferol (VITAMIN D3) 50 MCG (2000 UT) TABS Take 8,000 Units by mouth daily.  . divalproex (DEPAKOTE SPRINKLE) 125 MG capsule 250 mg every morning and 125 mg in the afternoon (Patient taking differently: Take 125-250 mg by mouth See admin instructions. Take 125 mg by mouth at noontime and 250 mg at bedtime)  . divalproex (DEPAKOTE) 125 MG DR tablet Take 250 mg by mouth daily after breakfast.  . ferrous sulfate 325 (65 FE) MG tablet Take 325 mg by mouth daily with breakfast.  . FLUoxetine (PROZAC) 20 MG tablet Take 30 mg by mouth daily.  . fluticasone (FLONASE) 50 MCG/ACT nasal spray Place 1 spray into both nostrils daily.  Marland Kitchen gabapentin (NEURONTIN) 100 MG capsule Take 200 mg by mouth 2 (two) times daily.  Marland Kitchen gabapentin (NEURONTIN) 400 MG capsule Take 400 mg by mouth at bedtime.  . hydrALAZINE (APRESOLINE) 25 MG tablet Take 1 tablet (25 mg total) by mouth 3 (three) times daily. (Patient taking differently: Take 25 mg by mouth 4 (four) times daily. )  . hydrocortisone (ANUSOL-HC) 25 MG suppository Place 1 suppository (25 mg total) rectally 2 (two) times daily for 6 days.  . insulin aspart (NOVOLOG) 100 UNIT/ML injection Inject 0-15 Units into the skin 3 (three) times daily with meals. (Patient taking differently: Inject 0-12 Units into the skin See admin instructions. Inject 0-12 units into the skin 2 times a day, per sliding scale: BGL 60-150 = 0 unit(s); 151-200 = 2 units; 201-250 = 6 units; 251-300 = 8 units; 301-350 = 10 units; 351-400 = 12 units; [phone] = CALL MD)  . Insulin Glargine  (BASAGLAR KWIKPEN) 100 UNIT/ML SOPN Inject 55 Units into the skin at bedtime.  Marland Kitchen ipratropium-albuterol (DUONEB) 0.5-2.5 (3) MG/3ML SOLN Take 3 mLs by nebulization 3 (three) times daily. (Patient not taking: Reported on 07/23/2018)  . linagliptin (TRADJENTA) 5 MG TABS tablet Take 5 mg by mouth daily.  . magnesium oxide (MAG-OX) 400 MG tablet Take 400 mg by mouth daily.  . NON FORMULARY Take 120 mLs by mouth See admin instructions. MedPass; Drink 120 ml's by mouth once a day  . Olopatadine HCl 0.2 % SOLN Place 1 drop into both eyes daily.  . OXYGEN Inhale 2 L into the lungs as needed (FOR SATS <90%).   . pantoprazole (PROTONIX) 40 MG tablet Take 1 tablet (40 mg total) by mouth 2 (two) times daily.  . predniSONE (DELTASONE) 20 MG tablet Take 1 tablet (20 mg  total) by mouth daily with breakfast. (Patient not taking: Reported on 07/23/2018)  . rOPINIRole (REQUIP) 1 MG tablet Take 1 mg by mouth 2 (two) times daily.  Marland Kitchen torsemide (DEMADEX) 20 MG tablet Take 2 tablets (40 mg total) by mouth daily.  . traMADol (ULTRAM) 50 MG tablet Take 0.5 tablets (25 mg total) by mouth every 12 (twelve) hours as needed (for pain).  . traZODone (DESYREL) 50 MG tablet Take 50 mg by mouth at bedtime.  . vitamin B-12 (CYANOCOBALAMIN) 1000 MCG tablet Take 1,000 mcg by mouth daily.   No facility-administered encounter medications on file as of 08/04/2018.       Nirali Magouirk Jenetta Downer, NP

## 2018-11-24 ENCOUNTER — Non-Acute Institutional Stay: Payer: Medicare Other | Admitting: Adult Health Nurse Practitioner

## 2018-11-24 ENCOUNTER — Other Ambulatory Visit: Payer: Self-pay

## 2018-11-24 DIAGNOSIS — Z515 Encounter for palliative care: Secondary | ICD-10-CM

## 2018-11-24 NOTE — Progress Notes (Signed)
[facility] Consult Note Telephone: [phone]  Fax: [phone]  PATIENT NAME: [name] DOB: 03/11/1929 MRN: [id number]  PRIMARY CARE PROVIDER:   Josetta Huddle, MD  REFERRING PROVIDER:  Josetta Huddle, MD [address] [place],  [place] [address]  RESPONSIBLE PARTY:  Extended Emergency Contact Information Primary Emergency Contact: [name] Address: [address], [place] [address] [place] Phone: [phone] Relation: Son Secondary Emergency Contact: [name] Address: [address] [place], [place] [address] [place] Phone: [phone] Relation: Daughter   Due to the COVID-19 crisis, this visit was done via telemedicine and it was initiated and consent by this patient and or family.  Visit via Zoom with facility SW, Madelin Rear.  Patient with cognitive impairment and unable to contribute to assessment.    RECOMMENDATIONS and PLAN:  1.  Parkinson's disease. Patient requires assistance with ADLs.  Staff has noticed that she shakes, especially in the arms and hands. Staff does report that she will have worse shaking when she gets agitated. She is on requip for RLS but is not currently on anything for her Parkinson's.  Is being seen by neurology but patient's are not allowed out of the building for appointments as much as possible due to pandemic.  Suggest telehealth with neurology or if PCP comfortable starting on something like sinemet for her tremors related to Parkinson's.   2.  Behaviors.  Patient does have history of major depressive disorder and anxiety.  Staff reports that she has had recent behaviors of paranoia and being "ill-tempered" with the staff and at times refuse to get up.  She is being seen by psych services and would continue with their recommendations.    3.  Goals of care.  Patient is a DNR.  Other than recent  behaviors she seems to be at baseline.  She eats well and likes to drink Ensure.  She is given Ensure when she has a meal she does not eat well at.  He weight has been stable at 111.  She did have a fall on 10/19/2018 with no injury.  Was treated for UTI with ESBL earlier this month.  She is having her dentures realigned and they are to be back for the patient in 5-7 days.  Continue supportive care.  VS reported by staff HR 67, BP 152/68, temp 98.2  I spent 20 minutes providing this consultation,  from 3:20 to 3:40. More than 50% of the time in this consultation was spent coordinating communication.   HISTORY OF PRESENT ILLNESS:  [name] is a 85 y.o. year old female with multiple medical problems including Parkinson's disease, HTN,CAD, gout,memory loss, DM. Palliative Care was asked to help address goals of care.   CODE STATUS: DNR  PPS: 30% HOSPICE ELIGIBILITY/DIAGNOSIS: TBD  PAST MEDICAL HISTORY:  Past Medical History:  Diagnosis Date  . Acute on chronic respiratory failure ([facility])   . Anemia   . Cancer ([facility]) 07/04/10   endometrial  . Chronic pain   . COPD (chronic obstructive pulmonary disease) ([facility])   . Diabetes mellitus   . Dysphagia   . Falling episodes   . GERD (gastroesophageal reflux disease)   . Gout   . Hemiplegia and hemiparesis following cerebral infarction affecting left non-dominant side ([facility])   . Hx of radiation therapy 09/13/10- 10/19/10, 6/12, 6/19, 11/14/10   pelvis, external beam tehn brachytherapy  . Hypertension   .  Parkinson's disease ([facility])   . Pneumonia   . Renal disorder   . Spondylosis without myelopathy or radiculopathy   . Stroke ([facility])    L sided deficits w/slurred speech  . Unspecified right bundle-branch block     SOCIAL HX:  Social History   Tobacco Use  . Smoking status: Former Smoker    Types: Cigarettes  . Smokeless tobacco: Never Used  . Tobacco comment: quit many yrs ago  Substance Use Topics  . Alcohol use: No    ALLERGIES:   Allergies  Allergen Reactions  . Penicillins Other (See Comments)    Is ALLERGIC, per MAR     PERTINENT MEDICATIONS:  Outpatient Encounter Medications as of 11/24/2018  Medication Sig  . acetaminophen (TYLENOL) 325 MG tablet Take 650 mg by mouth See admin instructions. Take 650 mg by mouth three times a day and 650 mg every 8 hours as needed for pain  . albuterol (PROVENTIL) (2.5 MG/3ML) 0.083% nebulizer solution Take 2.5 mg by nebulization every 2 (two) hours as needed for wheezing.  Marland Kitchen allopurinol (ZYLOPRIM) 300 MG tablet Take 150 mg by mouth at bedtime.  . bisoprolol (ZEBETA) 10 MG tablet Take 1 tablet (10 mg total) by mouth daily.  . Carboxymethylcellulose Sod PF 0.5 % SOLN Place 1 drop into both eyes 2 (two) times daily.  . Cholecalciferol (VITAMIN D3) 50 MCG (2000 UT) TABS Take 8,000 Units by mouth daily.  . divalproex (DEPAKOTE SPRINKLE) 125 MG capsule 250 mg every morning and 125 mg in the afternoon (Patient taking differently: Take 125-250 mg by mouth See admin instructions. Take 125 mg by mouth at noontime and 250 mg at bedtime)  . divalproex (DEPAKOTE) 125 MG DR tablet Take 250 mg by mouth daily after breakfast.  . ferrous sulfate 325 (65 FE) MG tablet Take 325 mg by mouth daily with breakfast.  . FLUoxetine (PROZAC) 20 MG tablet Take 30 mg by mouth daily.  . fluticasone (FLONASE) 50 MCG/ACT nasal spray Place 1 spray into both nostrils daily.  Marland Kitchen gabapentin (NEURONTIN) 100 MG capsule Take 200 mg by mouth 2 (two) times daily.  Marland Kitchen gabapentin (NEURONTIN) 400 MG capsule Take 400 mg by mouth at bedtime.  . hydrALAZINE (APRESOLINE) 25 MG tablet Take 1 tablet (25 mg total) by mouth 3 (three) times daily. (Patient taking differently: Take 25 mg by mouth 4 (four) times daily. )  . insulin aspart (NOVOLOG) 100 UNIT/ML injection Inject 0-15 Units into the skin 3 (three) times daily with meals. (Patient taking differently: Inject 0-12 Units into the skin See admin instructions. Inject 0-12 units  into the skin 2 times a day, per sliding scale: BGL 60-150 = 0 unit(s); 151-200 = 2 units; 201-250 = 6 units; 251-300 = 8 units; 301-350 = 10 units; 351-400 = 12 units; [phone] = CALL MD)  . Insulin Glargine (BASAGLAR KWIKPEN) 100 UNIT/ML SOPN Inject 55 Units into the skin at bedtime.  Marland Kitchen ipratropium-albuterol (DUONEB) 0.5-2.5 (3) MG/3ML SOLN Take 3 mLs by nebulization 3 (three) times daily. (Patient not taking: Reported on 07/23/2018)  . linagliptin (TRADJENTA) 5 MG TABS tablet Take 5 mg by mouth daily.  . magnesium oxide (MAG-OX) 400 MG tablet Take 400 mg by mouth daily.  . NON FORMULARY Take 120 mLs by mouth See admin instructions. MedPass; Drink 120 ml's by mouth once a day  . Olopatadine HCl 0.2 % SOLN Place 1 drop into both eyes daily.  . OXYGEN Inhale 2 L into the lungs as needed (FOR SATS <  90%).   . pantoprazole (PROTONIX) 40 MG tablet Take 1 tablet (40 mg total) by mouth 2 (two) times daily.  . predniSONE (DELTASONE) 20 MG tablet Take 1 tablet (20 mg total) by mouth daily with breakfast. (Patient not taking: Reported on 07/23/2018)  . rOPINIRole (REQUIP) 1 MG tablet Take 1 mg by mouth 2 (two) times daily.  Marland Kitchen torsemide (DEMADEX) 20 MG tablet Take 2 tablets (40 mg total) by mouth daily.  . traMADol (ULTRAM) 50 MG tablet Take 0.5 tablets (25 mg total) by mouth every 12 (twelve) hours as needed (for pain).  . traZODone (DESYREL) 50 MG tablet Take 50 mg by mouth at bedtime.  . vitamin B-12 (CYANOCOBALAMIN) 1000 MCG tablet Take 1,000 mcg by mouth daily.   No facility-administered encounter medications on file as of 11/24/2018.       Shaila Gilchrest Jenetta Downer, NP

## 2018-12-29 ENCOUNTER — Non-Acute Institutional Stay: Payer: Medicare Other | Admitting: Adult Health Nurse Practitioner

## 2018-12-29 DIAGNOSIS — Z515 Encounter for palliative care: Secondary | ICD-10-CM

## 2018-12-29 NOTE — Progress Notes (Signed)
[facility] Consult Note Telephone: [phone]  Fax: [phone]  PATIENT NAME: [name] DOB: 13-Nov-1928 MRN: [id number]  PRIMARY CARE PROVIDER:   Josetta Huddle, MD  REFERRING PROVIDER:  Josetta Huddle, MD [address] [place],  [place] [address]  RESPONSIBLE PARTY:   Extended Emergency Contact Information Primary Emergency Contact: Church,Carl Address: [place]  [place], [place] [address] [place] Phone: [phone] Relation: Son Secondary Emergency Contact: Tamera Reason Address: [address] [place], [place] [address] [place] Phone: [phone] Relation: Daughter   Due to the COVID-19 crisis, this visit was done via telemedicine and it was initiated and consent by this patient and or family.Visit via Zoom with facility SW, Madelin Rear. Patient with cognitive impairment and unable to contribute to assessment.       RECOMMENDATIONS and PLAN:  1.  Advanced care planning.  Patient is a DNR.  Patient has not had any falls, infection, or hospitalizations since last visit.  Follow up in 1-2 months.  2.  Parkinson's disease. Patient requires assistance with ADLs.  Staff has noticed that she is shaking more, especially in the arms and hands. Staff also reports that she is sleeping more. She is on requip for RLS but is not currently on anything for her Parkinson's.  Is being seen by neurology but patient's are not allowed out of the building for appointments as much as possible due to pandemic. SW to reach out to PCP for recommendation of sinemet for her Parkinson sypmptoms, main concern is that it could make her sleep even more. Selegiline and comtan are some other choices that could be used for her Parkinson's symptoms.  Continue to consult with neurology as able.  I spent 20 minutes providing this consultation,  from 3:30 to 3:50. More than  50% of the time in this consultation was spent coordinating communication.   HISTORY OF PRESENT ILLNESS:  [name] is a 85 y.o. year old female with multiple medical problems including Parkinson's disease, HTN,CAD, gout,memory loss, DM. Palliative Care was asked to help address goals of care.   CODE STATUS: DNR  PPS: 30% HOSPICE ELIGIBILITY/DIAGNOSIS: TBD  PAST MEDICAL HISTORY:  Past Medical History:  Diagnosis Date  . Acute on chronic respiratory failure ([facility])   . Anemia   . Cancer ([facility]) 07/04/10   endometrial  . Chronic pain   . COPD (chronic obstructive pulmonary disease) ([facility])   . Diabetes mellitus   . Dysphagia   . Falling episodes   . GERD (gastroesophageal reflux disease)   . Gout   . Hemiplegia and hemiparesis following cerebral infarction affecting left non-dominant side ([facility])   . Hx of radiation therapy 09/13/10- 10/19/10, 6/12, 6/19, 11/14/10   pelvis, external beam tehn brachytherapy  . Hypertension   . Parkinson's disease ([facility])   . Pneumonia   . Renal disorder   . Spondylosis without myelopathy or radiculopathy   . Stroke ([facility])    L sided deficits w/slurred speech  . Unspecified right bundle-branch block     SOCIAL HX:  Social History   Tobacco Use  . Smoking status: Former Smoker    Types: Cigarettes  . Smokeless tobacco: Never Used  . Tobacco comment: quit many yrs ago  Substance Use Topics  . Alcohol use: No    ALLERGIES:  Allergies  Allergen Reactions  . Penicillins Other (See Comments)    Is ALLERGIC, per [facility]  PERTINENT MEDICATIONS:  Outpatient Encounter Medications as of 12/29/2018  Medication Sig  . acetaminophen (TYLENOL) 325 MG tablet Take 650 mg by mouth See admin instructions. Take 650 mg by mouth three times a day and 650 mg every 8 hours as needed for pain  . albuterol (PROVENTIL) (2.5 MG/3ML) 0.083% nebulizer solution Take 2.5 mg by nebulization every 2 (two) hours as needed for wheezing.  Marland Kitchen allopurinol (ZYLOPRIM) 300 MG tablet  Take 150 mg by mouth at bedtime.  . bisoprolol (ZEBETA) 10 MG tablet Take 1 tablet (10 mg total) by mouth daily.  . Carboxymethylcellulose Sod PF 0.5 % SOLN Place 1 drop into both eyes 2 (two) times daily.  . Cholecalciferol (VITAMIN D3) 50 MCG (2000 UT) TABS Take 8,000 Units by mouth daily.  . divalproex (DEPAKOTE SPRINKLE) 125 MG capsule 250 mg every morning and 125 mg in the afternoon (Patient taking differently: Take 125-250 mg by mouth See admin instructions. Take 125 mg by mouth at noontime and 250 mg at bedtime)  . divalproex (DEPAKOTE) 125 MG DR tablet Take 250 mg by mouth daily after breakfast.  . ferrous sulfate 325 (65 FE) MG tablet Take 325 mg by mouth daily with breakfast.  . FLUoxetine (PROZAC) 20 MG tablet Take 30 mg by mouth daily.  . fluticasone (FLONASE) 50 MCG/ACT nasal spray Place 1 spray into both nostrils daily.  Marland Kitchen gabapentin (NEURONTIN) 100 MG capsule Take 200 mg by mouth 2 (two) times daily.  Marland Kitchen gabapentin (NEURONTIN) 400 MG capsule Take 400 mg by mouth at bedtime.  . hydrALAZINE (APRESOLINE) 25 MG tablet Take 1 tablet (25 mg total) by mouth 3 (three) times daily. (Patient taking differently: Take 25 mg by mouth 4 (four) times daily. )  . insulin aspart (NOVOLOG) 100 UNIT/ML injection Inject 0-15 Units into the skin 3 (three) times daily with meals. (Patient taking differently: Inject 0-12 Units into the skin See admin instructions. Inject 0-12 units into the skin 2 times a day, per sliding scale: BGL 60-150 = 0 unit(s); 151-200 = 2 units; 201-250 = 6 units; 251-300 = 8 units; 301-350 = 10 units; 351-400 = 12 units; [phone] = CALL MD)  . Insulin Glargine (BASAGLAR KWIKPEN) 100 UNIT/ML SOPN Inject 55 Units into the skin at bedtime.  Marland Kitchen ipratropium-albuterol (DUONEB) 0.5-2.5 (3) MG/3ML SOLN Take 3 mLs by nebulization 3 (three) times daily. (Patient not taking: Reported on 07/23/2018)  . linagliptin (TRADJENTA) 5 MG TABS tablet Take 5 mg by mouth daily.  . magnesium oxide (MAG-OX)  400 MG tablet Take 400 mg by mouth daily.  . NON FORMULARY Take 120 mLs by mouth See admin instructions. MedPass; Drink 120 ml's by mouth once a day  . Olopatadine HCl 0.2 % SOLN Place 1 drop into both eyes daily.  . OXYGEN Inhale 2 L into the lungs as needed (FOR SATS <90%).   . pantoprazole (PROTONIX) 40 MG tablet Take 1 tablet (40 mg total) by mouth 2 (two) times daily.  . predniSONE (DELTASONE) 20 MG tablet Take 1 tablet (20 mg total) by mouth daily with breakfast. (Patient not taking: Reported on 07/23/2018)  . rOPINIRole (REQUIP) 1 MG tablet Take 1 mg by mouth 2 (two) times daily.  Marland Kitchen torsemide (DEMADEX) 20 MG tablet Take 2 tablets (40 mg total) by mouth daily.  . traMADol (ULTRAM) 50 MG tablet Take 0.5 tablets (25 mg total) by mouth every 12 (twelve) hours as needed (for pain).  . traZODone (DESYREL) 50 MG tablet Take 50 mg by mouth  at bedtime.  . vitamin B-12 (CYANOCOBALAMIN) 1000 MCG tablet Take 1,000 mcg by mouth daily.   No facility-administered encounter medications on file as of 12/29/2018.      Vesna Kable Jenetta Downer, NP

## 2018-12-30 ENCOUNTER — Other Ambulatory Visit: Payer: Self-pay

## 2019-05-19 ENCOUNTER — Other Ambulatory Visit: Payer: Self-pay

## 2019-05-19 ENCOUNTER — Non-Acute Institutional Stay: Payer: Medicare Other | Admitting: Hospice

## 2019-05-19 DIAGNOSIS — Z515 Encounter for palliative care: Secondary | ICD-10-CM

## 2019-05-19 NOTE — Progress Notes (Addendum)
[facility] Consult Note Telephone: [phone]  Fax: [phone]  PATIENT NAME: [name] DOB: [birth year] MRN: [id number]  PRIMARY CARE PROVIDER:   Josetta Huddle, MD  REFERRING PROVIDER:  Josetta Huddle, MD [address] [place],  [place] [address]  RESPONSIBLE PARTY:   Extended Emergency Contact Information Primary Emergency Contact: Church,Carl Address: [address], [place] [address] [place] Phone: [phone] Relation: Son Secondary Emergency Contact: Tamera Reason Address: [address] [place], [place] [address] [place] Phone: [phone] Relation: Daughter .  RECOMMENDATIONS/PLAN:   Advance Care Planning/Goals of Care:  Visit consisted of building trust and follow up on palliative care. Patient is DNR. MOST selections include comfort measures and no feeding tube. Goals of care include to maximize quality of life and symptom management. DNR and MOST forms uploaded to Epic today.  Symptom management: Ongoing Memory loss and tremors are chronic related to Parkinsons Disease, Dementia FAST 7b. Patient no longer able to ambulate, hoyer lift for all transfers, requires  assistance with ADLs, able to feed self with assistance. Patient continues on Humolog and Lantus as ordered for type2DM, last A1c Oct '20 was 9.1. Patient continues on oxygen supplementation secondary to COPD; no recent exacerbation. Patient denied respiratory distress, pain/discomfort. Nursing with no complaints/concerns. Encouraged ongoing nursing supportive care Follow up: Palliative care will continue to follow patient for goals of care clarification and symptom management. I spent 30 minutes providing this consultation, from 11am to 11.30am. More than 50% of the time in this consultation was spent coordinating communication.   HISTORY OF PRESENT ILLNESS:  [name] is a 86 y.o. year old female with multiple medical problems including Parkinson's disease,Dementia, HTN,CAD, gout, Type2DM. Palliative Care was asked to help address goals of care.    CODE STATUS: DNR  PPS: 30% HOSPICE ELIGIBILITY/DIAGNOSIS: TBD  PAST MEDICAL HISTORY:  Past Medical History:  Diagnosis Date  . Acute on chronic respiratory failure ([facility])   . Anemia   . Cancer ([facility]) 07/04/10   endometrial  . Chronic pain   . COPD (chronic obstructive pulmonary disease) ([facility])   . Diabetes mellitus   . Dysphagia   . Falling episodes   . GERD (gastroesophageal reflux disease)   . Gout   . Hemiplegia and hemiparesis following cerebral infarction affecting left non-dominant side ([facility])   . Hx of radiation therapy 09/13/10- 10/19/10, 6/12, 6/19, 11/14/10   pelvis, external beam tehn brachytherapy  . Hypertension   . Parkinson's disease ([facility])   . Pneumonia   . Renal disorder   . Spondylosis without myelopathy or radiculopathy   . Stroke ([facility])    L sided deficits w/slurred speech  . Unspecified right bundle-branch block     SOCIAL HX:  Social History   Tobacco Use  . Smoking status: Former Smoker    Types: Cigarettes  . Smokeless tobacco: Never Used  . Tobacco comment: quit many yrs ago  Substance Use Topics  . Alcohol use: No    ALLERGIES:  Allergies  Allergen Reactions  . Penicillins Other (See Comments)    Is ALLERGIC, per [facility]     PERTINENT MEDICATIONS:  Outpatient Encounter Medications as of 05/19/2019  Medication Sig  . acetaminophen (TYLENOL) 325 MG tablet Take 650 mg by mouth See admin instructions. Take 650 mg by mouth three times a day and 650 mg every 8 hours as needed for pain  . albuterol (PROVENTIL) (  2.5 MG/3ML) 0.083% nebulizer solution Take 2.5 mg by nebulization every 2 (two) hours as needed for wheezing.  Marland Kitchen allopurinol (ZYLOPRIM) 300 MG tablet Take 150 mg by mouth at bedtime.  . bisoprolol (ZEBETA) 10 MG tablet Take 1 tablet (10 mg total) by mouth daily.    . Carboxymethylcellulose Sod PF 0.5 % SOLN Place 1 drop into both eyes 2 (two) times daily.  . Cholecalciferol (VITAMIN D3) 50 MCG (2000 UT) TABS Take 8,000 Units by mouth daily.  . divalproex (DEPAKOTE SPRINKLE) 125 MG capsule 250 mg every morning and 125 mg in the afternoon (Patient taking differently: Take 125-250 mg by mouth See admin instructions. Take 125 mg by mouth at noontime and 250 mg at bedtime)  . divalproex (DEPAKOTE) 125 MG DR tablet Take 250 mg by mouth daily after breakfast.  . ferrous sulfate 325 (65 FE) MG tablet Take 325 mg by mouth daily with breakfast.  . FLUoxetine (PROZAC) 20 MG tablet Take 30 mg by mouth daily.  . fluticasone (FLONASE) 50 MCG/ACT nasal spray Place 1 spray into both nostrils daily.  Marland Kitchen gabapentin (NEURONTIN) 100 MG capsule Take 200 mg by mouth 2 (two) times daily.  Marland Kitchen gabapentin (NEURONTIN) 400 MG capsule Take 400 mg by mouth at bedtime.  . hydrALAZINE (APRESOLINE) 25 MG tablet Take 1 tablet (25 mg total) by mouth 3 (three) times daily. (Patient taking differently: Take 25 mg by mouth 4 (four) times daily. )  . insulin aspart (NOVOLOG) 100 UNIT/ML injection Inject 0-15 Units into the skin 3 (three) times daily with meals. (Patient taking differently: Inject 0-12 Units into the skin See admin instructions. Inject 0-12 units into the skin 2 times a day, per sliding scale: BGL 60-150 = 0 unit(s); 151-200 = 2 units; 201-250 = 6 units; 251-300 = 8 units; 301-350 = 10 units; 351-400 = 12 units; [phone] = CALL MD)  . Insulin Glargine (BASAGLAR KWIKPEN) 100 UNIT/ML SOPN Inject 55 Units into the skin at bedtime.  Marland Kitchen ipratropium-albuterol (DUONEB) 0.5-2.5 (3) MG/3ML SOLN Take 3 mLs by nebulization 3 (three) times daily. (Patient not taking: Reported on 07/23/2018)  . linagliptin (TRADJENTA) 5 MG TABS tablet Take 5 mg by mouth daily.  . magnesium oxide (MAG-OX) 400 MG tablet Take 400 mg by mouth daily.  . NON FORMULARY Take 120 mLs by mouth See admin instructions. MedPass;  Drink 120 ml's by mouth once a day  . Olopatadine HCl 0.2 % SOLN Place 1 drop into both eyes daily.  . OXYGEN Inhale 2 L into the lungs as needed (FOR SATS <90%).   . pantoprazole (PROTONIX) 40 MG tablet Take 1 tablet (40 mg total) by mouth 2 (two) times daily.  . predniSONE (DELTASONE) 20 MG tablet Take 1 tablet (20 mg total) by mouth daily with breakfast. (Patient not taking: Reported on 07/23/2018)  . rOPINIRole (REQUIP) 1 MG tablet Take 1 mg by mouth 2 (two) times daily.  Marland Kitchen torsemide (DEMADEX) 20 MG tablet Take 2 tablets (40 mg total) by mouth daily.  . traMADol (ULTRAM) 50 MG tablet Take 0.5 tablets (25 mg total) by mouth every 12 (twelve) hours as needed (for pain).  . traZODone (DESYREL) 50 MG tablet Take 50 mg by mouth at bedtime.  . vitamin B-12 (CYANOCOBALAMIN) 1000 MCG tablet Take 1,000 mcg by mouth daily.   No facility-administered encounter medications on file as of 05/19/2019.    PHYSICAL EXAM:   General: sitting up in wheel chair,  in no acute distress Cardiovascular: regular rate and  rhythm Pulmonary: clear ant/posterior fields on oxygen supplementation, normal respiratory effort Abdomen: soft, nontender, + bowel sounds GU: no suprapubic tenderness Extremities: no edema, no joint deformities Skin: no rashes on exposed skin Neurological: Weakness but otherwise nonfocal  Teodoro Spray, NP

## 2019-06-10 IMAGING — US US RENAL
1 series · 14 of 25 positions shown · non-contrast
Comparison: CT 07/19/2016

CLINICAL DATA: Acute renal failure on chronic kidney disease.

EXAM:
RENAL / URINARY TRACT ULTRASOUND COMPLETE

[Series 1: us renal · 0.25mm/px · 14 of 30 slices shown]
[im 1/30]
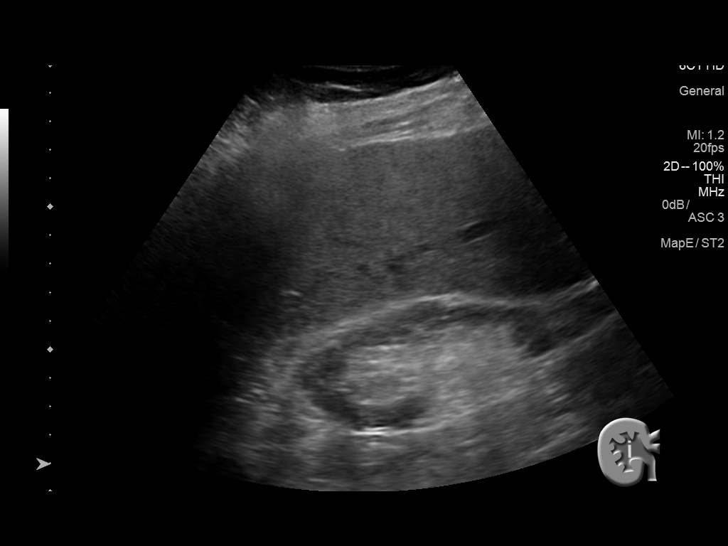
[im 3/30]
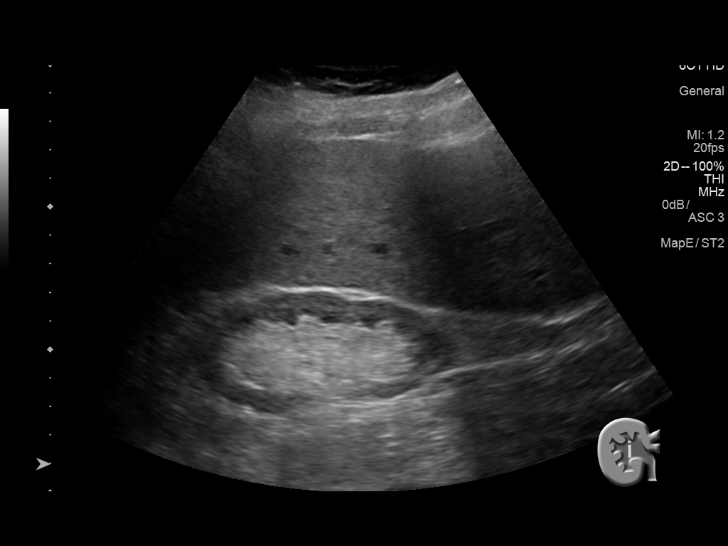
[im 5/30]
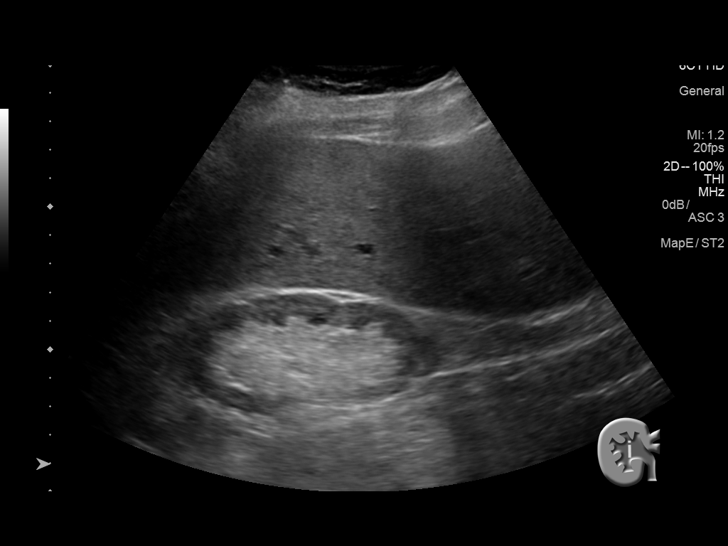
[im 8/30]
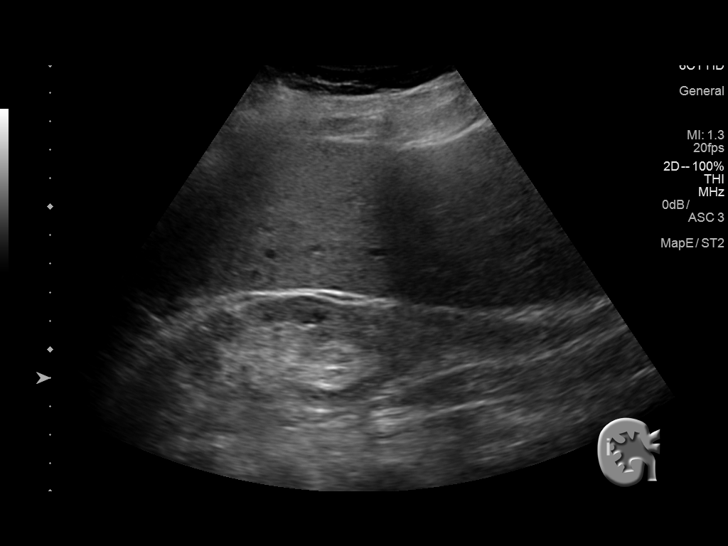
[im 10/30]
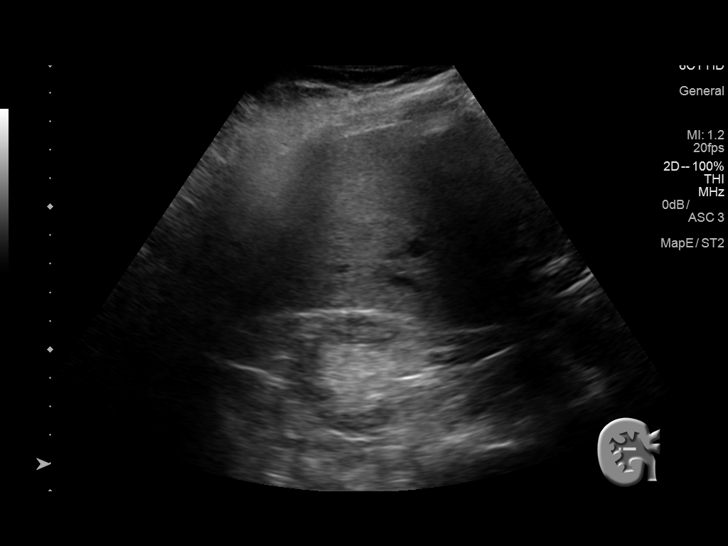
[im 11/30]
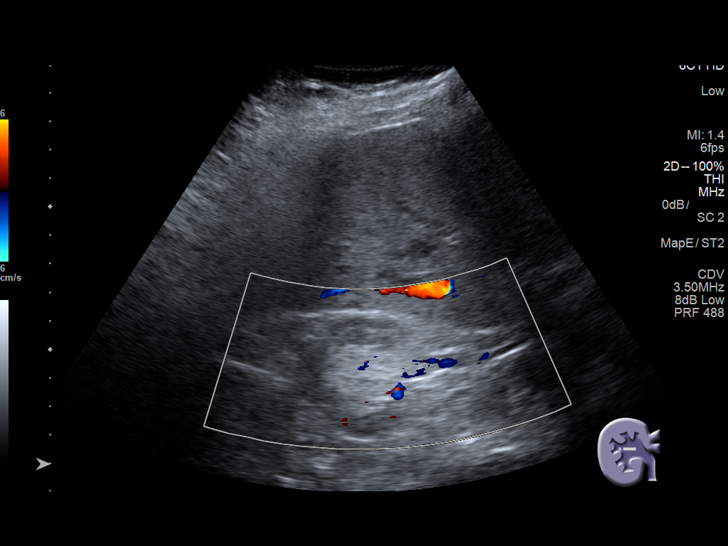
[im 14/30]
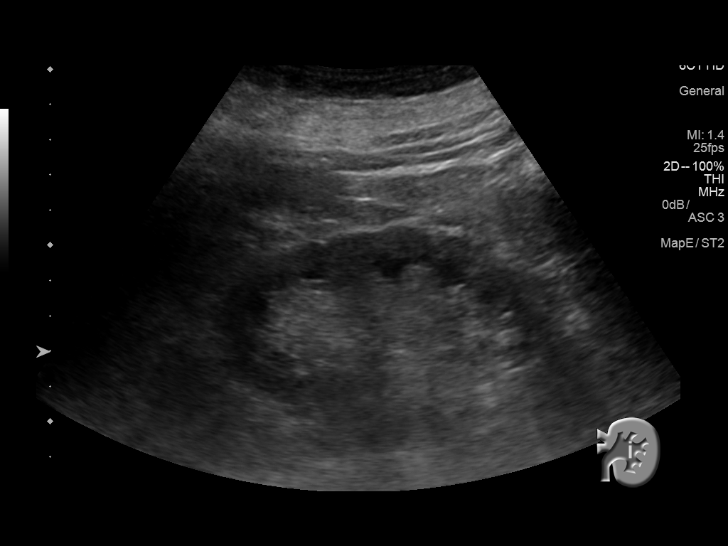
[im 16/30]
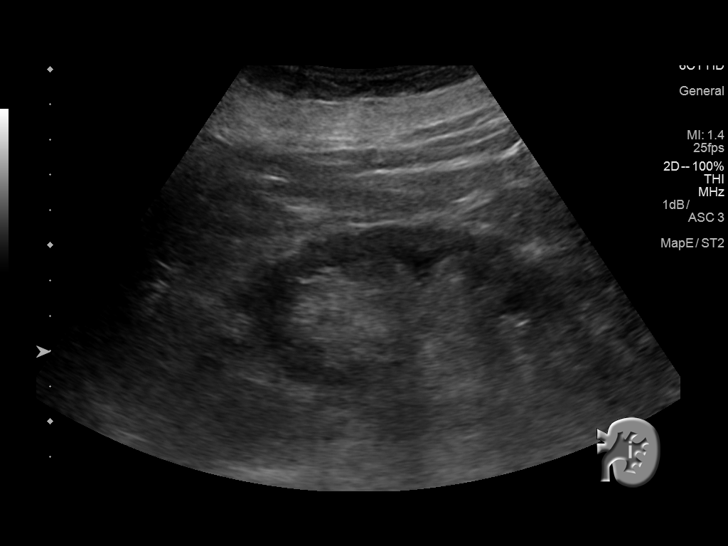
[im 19/30]
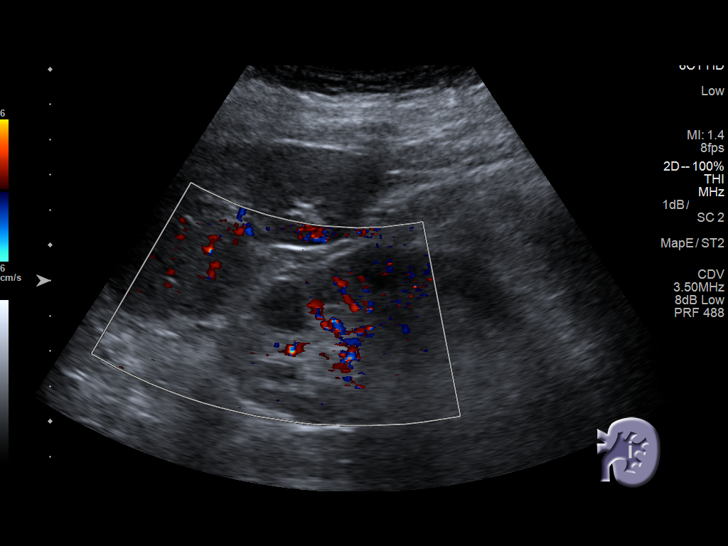
[im 20/30]
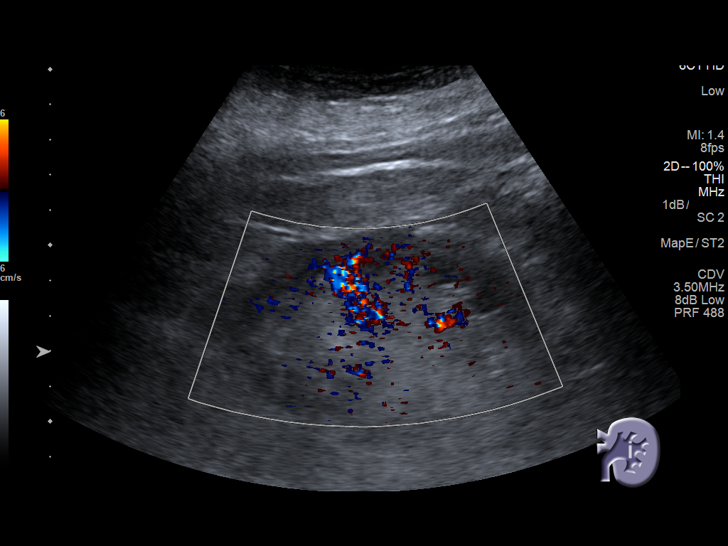
[im 22/30]
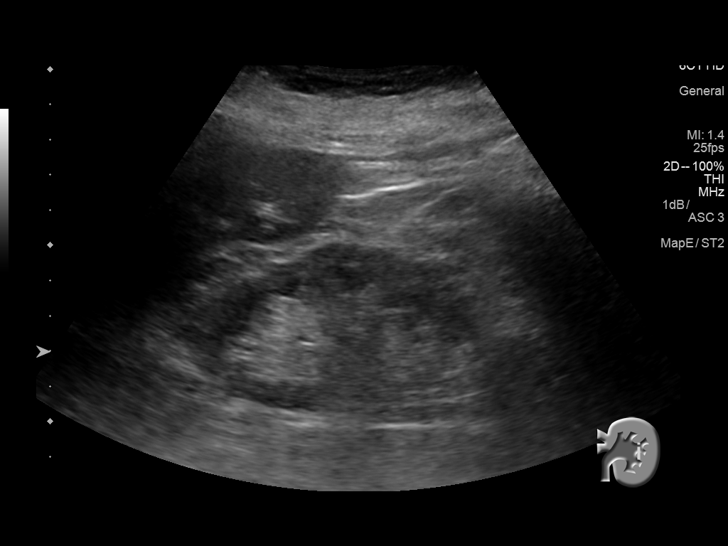
[im 25/30]
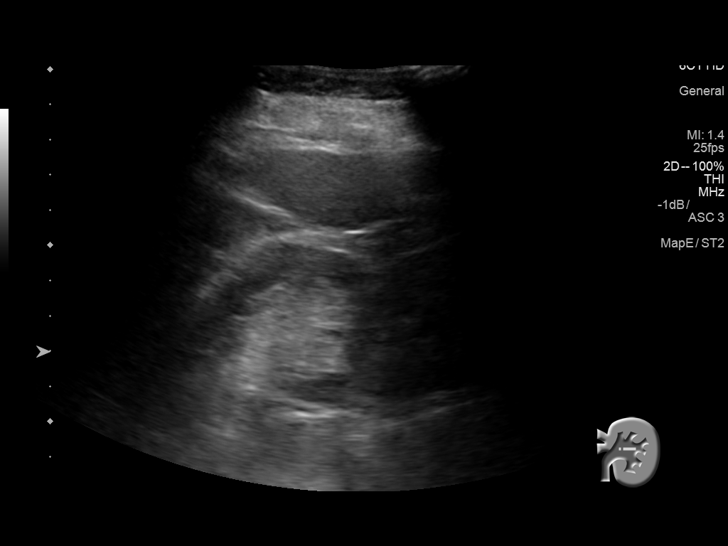
[im 27/30]
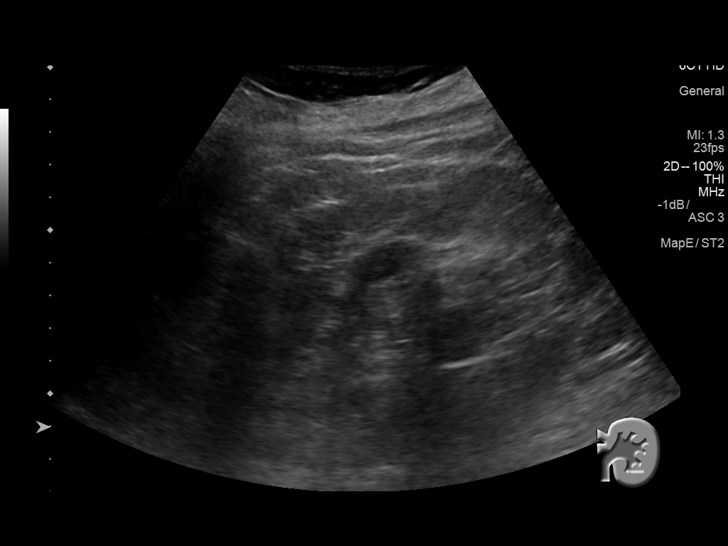
[im 30/30]
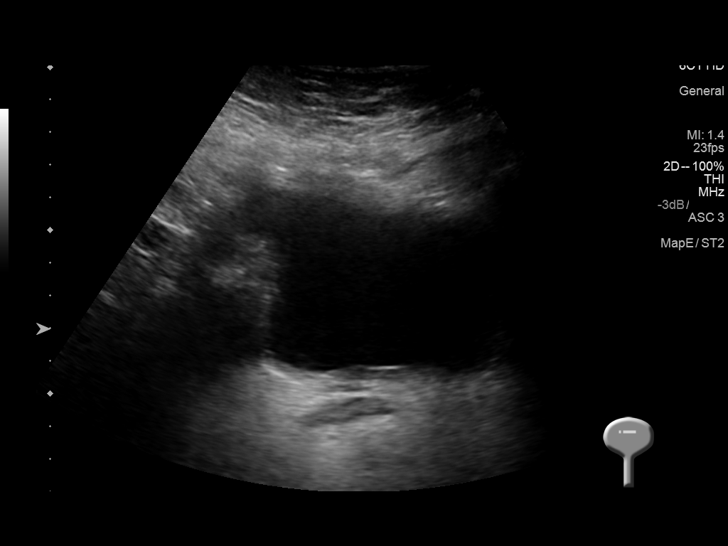

[14 of 25 positions shown; findings below may reference images not displayed]

FINDINGS: Right Kidney:

Length: 9.7 cm. Increased echotexture. Cortical thinning. No mass or
hydronephrosis.

Left Kidney:

Length: 9.9 cm. Increased echotexture and cortical thinning. No
hydronephrosis. 1.4 cm upper pole cyst.

Bladder:

Appears normal for degree of bladder distention.
IMPRESSION: Increased echotexture and cortical thinning bilaterally compatible
chronic medical renal disease.

No hydronephrosis.

## 2019-07-13 DEATH — deceased

## 2020-02-20 IMAGING — NM NUCLEAR MEDICINE GASTROINTESTINAL BLEEDING STUDY
2 series · 12 of 12 positions shown · non-contrast
Comparison: CT of the abdomen and pelvis on 07/23/2018

CLINICAL DATA: Active rectal bleed/ eval for bleeding origin.

EXAM:
NUCLEAR MEDICINE GASTROINTESTINAL BLEEDING SCAN
TECHNIQUE: Sequential abdominal images were obtained following intravenous
administration of Ec-OOm labeled red blood cells.
RADIOPHARMACEUTICALS:  26.3 mCi Ec-OOm pertechnetate in-vitro
labeled red cells.

[gi gi bleed · 4.52mm/px · 6 of 60 frames shown (1 of 2)]
[frame 6/60]
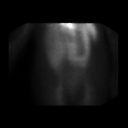
[frame 16/60]
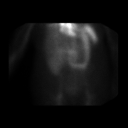
[frame 26/60]
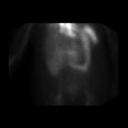
[frame 36/60]
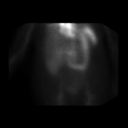
[frame 46/60]
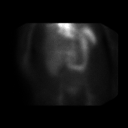
[frame 56/60]
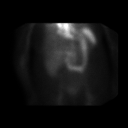

[gi gi bleed · 4.52mm/px · 6 of 60 frames shown (2 of 2)]
[frame 6/60]
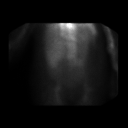
[frame 16/60]
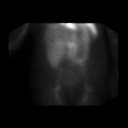
[frame 26/60]
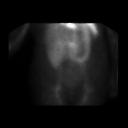
[frame 36/60]
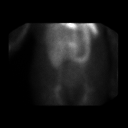
[frame 46/60]
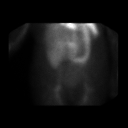
[frame 56/60]
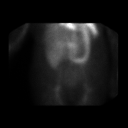

[12 of 12 positions shown; findings below may reference images not displayed]

FINDINGS: There is abnormal activity within the proximal to mid transverse
colon, leading to intraluminal activity within the distal transverse
colon, splenic flexure, and proximal descending colon. Expected
blood pool activity elsewhere.
IMPRESSION: Most likely site of bleeding is within the proximal to mid
transverse colon.

These results were called by telephone at the time of interpretation
on 07/25/2018 at [DATE] to Dr. Willair, who verbally acknowledged
these results.

The findings were discussed with Dr. Marjune at [DATE].
